# Patient Record
Sex: Male | Born: 1940 | Race: White | Hispanic: No | Marital: Married | Smoking: Former smoker
Health system: Southern US, Community
[De-identification: ages and names within clinical notes are randomized; demographics above are authoritative.]

## PROBLEM LIST (undated history)

## (undated) DIAGNOSIS — I050 Rheumatic mitral stenosis: Secondary | ICD-10-CM

## (undated) DIAGNOSIS — M1990 Unspecified osteoarthritis, unspecified site: Secondary | ICD-10-CM

## (undated) DIAGNOSIS — I4891 Unspecified atrial fibrillation: Secondary | ICD-10-CM

## (undated) DIAGNOSIS — R011 Cardiac murmur, unspecified: Secondary | ICD-10-CM

## (undated) DIAGNOSIS — I10 Essential (primary) hypertension: Secondary | ICD-10-CM

## (undated) DIAGNOSIS — K759 Inflammatory liver disease, unspecified: Secondary | ICD-10-CM

## (undated) DIAGNOSIS — I739 Peripheral vascular disease, unspecified: Secondary | ICD-10-CM

## (undated) DIAGNOSIS — I350 Nonrheumatic aortic (valve) stenosis: Secondary | ICD-10-CM

## (undated) DIAGNOSIS — I318 Other specified diseases of pericardium: Secondary | ICD-10-CM

---
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
## 1981-11-12 HISTORY — PX: CHOLECYSTECTOMY: SHX55

## 2005-11-12 HISTORY — PX: PERICARDIAL FLUID DRAINAGE: SHX5100

## 2007-01-29 ENCOUNTER — Other Ambulatory Visit: Admission: RE | Admit: 2007-01-29 | Discharge: 2007-01-29 | Payer: Self-pay | Admitting: Oncology

## 2007-07-17 ENCOUNTER — Ambulatory Visit: Payer: Self-pay | Admitting: Oncology

## 2010-11-12 HISTORY — PX: CATARACT EXTRACTION: SUR2

## 2012-09-29 ENCOUNTER — Other Ambulatory Visit: Payer: Self-pay | Admitting: Cardiovascular Disease

## 2012-10-23 ENCOUNTER — Ambulatory Visit (HOSPITAL_COMMUNITY)
Admission: RE | Admit: 2012-10-23 | Discharge: 2012-10-23 | Disposition: A | Discharge status: Home / Self Care | Payer: Medicare Other | Source: Ambulatory Visit | Attending: Cardiovascular Disease | Admitting: Cardiovascular Disease

## 2012-10-23 ENCOUNTER — Encounter (HOSPITAL_COMMUNITY)
Admission: RE | Disposition: A | Discharge status: Home / Self Care | Payer: Self-pay | Source: Ambulatory Visit | Attending: Cardiovascular Disease

## 2012-10-23 ENCOUNTER — Encounter (HOSPITAL_COMMUNITY): Payer: Self-pay

## 2012-10-23 DIAGNOSIS — I050 Rheumatic mitral stenosis: Secondary | ICD-10-CM | POA: Insufficient documentation

## 2012-10-23 HISTORY — DX: Unspecified osteoarthritis, unspecified site: M19.90

## 2012-10-23 HISTORY — DX: Essential (primary) hypertension: I10

## 2012-10-23 HISTORY — DX: Inflammatory liver disease, unspecified: K75.9

## 2012-10-23 HISTORY — DX: Peripheral vascular disease, unspecified: I73.9

## 2012-10-23 HISTORY — DX: Cardiac murmur, unspecified: R01.1

## 2012-10-23 HISTORY — PX: TEE WITHOUT CARDIOVERSION: SHX5443

## 2012-10-23 SURGERY — ECHOCARDIOGRAM, TRANSESOPHAGEAL
Anesthesia: Moderate Sedation

## 2012-10-23 MED ORDER — MIDAZOLAM HCL 5 MG/ML IJ SOLN
INTRAMUSCULAR | Status: AC
  Filled 2012-10-23: qty 2

## 2012-10-23 MED ORDER — BUTAMBEN-TETRACAINE-BENZOCAINE 2-2-14 % EX AERO
INHALATION_SPRAY | CUTANEOUS | Status: DC | PRN
  Administered 2012-10-23: 2 via TOPICAL

## 2012-10-23 MED ORDER — FENTANYL CITRATE 0.05 MG/ML IJ SOLN
INTRAMUSCULAR | Status: DC | PRN
  Administered 2012-10-23 (×2): 25 ug via INTRAVENOUS

## 2012-10-23 MED ORDER — FENTANYL CITRATE 0.05 MG/ML IJ SOLN
INTRAMUSCULAR | Status: AC
  Filled 2012-10-23: qty 2

## 2012-10-23 MED ORDER — MIDAZOLAM HCL 10 MG/2ML IJ SOLN
INTRAMUSCULAR | Status: DC | PRN
  Administered 2012-10-23 (×2): 2 mg via INTRAVENOUS

## 2012-10-23 MED ORDER — SODIUM CHLORIDE 0.9 % IV SOLN
INTRAVENOUS | Status: DC
  Administered 2012-10-23: 13:00:00 via INTRAVENOUS

## 2012-10-23 NOTE — H&P (Signed)
Jon Lozano is an 71 y.o. male.  Cardiologist: [facility]   Chief Complaint:  None HPI:   The patient is a 71 yo caucasian male with a history of RA, peripheral artery disease(all three arteries were occluded in the right LE-plavix), pericardial effusion-2007, PVD, Shingles, PNA-07/2012, Cataract surgery, cholecystectomy 1983, and tobacco abuse.  He still smokes but only when having a beer, which he has not had for the last 3 weeks.    His father had CAD with MI x3.  He does complain of DOE, LEE(ankles) and double vision.  He recently saw his ophthalmologist.  He denies N, V, fever, CP, SOB, orthopnea, PND, ABD pain, dysuria, hematuria, hematochezia.  He presents for TEE secondary to MV concern due to enlargement of LA.   Last NUC ~73months ago.  No past medical history on file.  No past surgical history on file.  No family history on file. Social History:  does not have a smoking history on file. He does not have any smokeless tobacco history on file. His alcohol and drug histories not on file.  Allergies: Not on File  Medications Prior to Admission  Medication Sig Dispense Refill  . aspirin 81 MG tablet Take 81 mg by mouth daily.      . calcium carbonate (OS-CAL) 1250 MG chewable tablet Chew 1 tablet by mouth daily. Takes 500 mg with 600 IU of vit D3      . clopidogrel (PLAVIX) 75 MG tablet Take 75 mg by mouth daily.      . fesoterodine (TOVIAZ) 8 MG TB24 Take 8 mg by mouth daily.      . folic acid (FOLVITE) 1 MG tablet Take 1 mg by mouth daily.      Marland Kitchen HYDROcodone-acetaminophen (NORCO/VICODIN) 5-325 MG per tablet Take 1 tablet by mouth every 6 (six) hours as needed.      . leflunomide (ARAVA) 20 MG tablet Take 20 mg by mouth daily.      . prednisoLONE 5 MG TABS Take 10 mg by mouth.      . pregabalin (LYRICA) 150 MG capsule Take 150 mg by mouth 3 (three) times daily.        No results found for this or any previous visit (from the past 48 hour(s)). No results  found.  Review of Systems  Constitutional: Negative for fever.  HENT: Negative for sore throat.   Eyes: Positive for double vision.  Respiratory: Negative for cough and shortness of breath.        +DOE  Cardiovascular: Positive for leg swelling. Negative for chest pain, palpitations, orthopnea and PND.  Gastrointestinal: Negative for nausea, vomiting, abdominal pain, diarrhea, constipation, blood in stool and melena.  Genitourinary: Negative for dysuria and hematuria.  Musculoskeletal: Negative for myalgias.  Neurological: Negative for dizziness and weakness.    There were no vitals taken for this visit. Physical Exam  Constitutional: He is oriented to person, place, and time. He appears well-developed. No distress.       Overweight  HENT:  Head: Normocephalic and atraumatic.  Eyes: EOM are normal. Pupils are equal, round, and reactive to light. No scleral icterus.  Neck: Normal range of motion. Neck supple.  Cardiovascular: Normal rate, regular rhythm, S1 normal and S2 normal.   No murmur heard. Pulses:      Radial pulses are 2+ on the right side, and 2+ on the left side.       Dorsalis pedis pulses are 1+ on the right  side, and 1+ on the left side.       No Carotid Bruits.  Respiratory: Effort normal and breath sounds normal. He has no wheezes. He has no rales.  GI: Soft. Bowel sounds are normal. He exhibits no distension. There is no tenderness.  Musculoskeletal: He exhibits edema.       2+ LEE   Lymphadenopathy:    He has no cervical adenopathy.  Neurological: He is alert and oriented to person, place, and time. He exhibits normal muscle tone.  Skin: Skin is warm and dry.  Psychiatric: He has a normal mood and affect.     Assessment/Plan 1.  Hx of pericardial eff 2. RA 3. PAD.  Plan:  He presents for TEE.  He does have 2+ LEE but no other signs of CHF.  He would likely benefit from a diuretic.  Will let is primary cardiologist address this.  BP and HR stable.     Jon Lozano, Jon Lozano 10/23/2012, 12:10 PM

## 2012-10-23 NOTE — Progress Notes (Signed)
  Echocardiogram Echocardiogram Transesophageal has been performed.  Georgian Co 10/23/2012, 2:27 PM

## 2012-10-23 NOTE — CV Procedure (Signed)
[name],Jon Lozano Male, 71 y.o., 1940-12-28  Location: [facility] ENDOSCOPY  Bed: NONE  MRN: [id number]  CSN: [id number]  Admit Dt: 10/23/12    TEE  INDICATIONS: valve calcification  PROCEDURE:   Informed consent was obtained prior to the procedure. The risks, benefits and alternatives for the procedure were discussed and the patient comprehended these risks.  Risks include, but are not limited to, cough, sore throat, vomiting, nausea, somnolence, esophageal and stomach trauma or perforation, bleeding, low blood pressure, aspiration, pneumonia, infection, trauma to the teeth and death.    After a procedural time-out, the oropharynx was anesthetized with 20% benzocaine spray. The patient was given 4 mg versed and 50 mcg fentanyl for moderate sedation.   The transesophageal probe was inserted in the esophagus and stomach without difficulty and multiple views were obtained.  The patient was kept under observation until the patient left the procedure room.  The patient left the procedure room in stable condition.   Agitated microbubble saline contrast was not administered.  COMPLICATIONS:    There were no immediate complications.  FINDINGS:  Severely calcified mitral annulus with extensive encroachment upon the mitral leaflets. Mitral stenosis, probably moderate to severe. The mean gradient is 14 mm Hg at a mean heart rate of 100 bpm. Pressure half time estimation was inaccurate due to E-A fusion. Mild mitral insufficiency. Aortic stenosis, mild. Normal to hyperdynamic left ventricular systolic function. EF >70%. Unable to assess diastolic function due to E-A fusion. Moderately dilated left atrium. No LA clot. Moderate aortic atherosclerotic debris.  RECOMMENDATIONS:   Follow up with Dr. Hanley Hays. Beta blockers will likely be beneficial to avoid tachycardia related increase in transmitral gradients.  Time Spent Directly with the Patient:  40 minutes   [name] 10/23/2012, 1:55 PM

## 2012-10-24 ENCOUNTER — Encounter (HOSPITAL_COMMUNITY): Payer: Self-pay | Admitting: Cardiovascular Disease

## 2014-02-05 ENCOUNTER — Inpatient Hospital Stay
Admission: RE | Admit: 2014-02-05 | Discharge: 2014-03-01 | Disposition: A | Discharge status: Skilled Nursing Facility | Payer: Medicare Other | Source: Other Acute Inpatient Hospital | Attending: Internal Medicine | Admitting: Internal Medicine

## 2014-02-06 ENCOUNTER — Other Ambulatory Visit (HOSPITAL_COMMUNITY): Payer: Medicare Other

## 2014-02-06 ENCOUNTER — Other Ambulatory Visit: Payer: Self-pay

## 2014-02-06 LAB — COMPREHENSIVE METABOLIC PANEL
ALBUMIN: 2.3 g/dL — AB (ref 3.5–5.2)
ALK PHOS: 96 U/L (ref 39–117)
ALT: 115 U/L — ABNORMAL HIGH (ref 0–53)
AST: 37 U/L (ref 0–37)
BUN: 19 mg/dL (ref 6–23)
CO2: 35 mEq/L — ABNORMAL HIGH (ref 19–32)
CREATININE: 1.17 mg/dL (ref 0.50–1.35)
Calcium: 8.8 mg/dL (ref 8.4–10.5)
Chloride: 95 mEq/L — ABNORMAL LOW (ref 96–112)
GFR calc Af Amer: 71 mL/min — ABNORMAL LOW (ref >90)
GFR calc non Af Amer: 61 mL/min — ABNORMAL LOW (ref >90)
Glucose, Bld: 80 mg/dL (ref 70–99)
POTASSIUM: 4.1 meq/L (ref 3.7–5.3)
Sodium: 140 mEq/L (ref 137–147)
TOTAL PROTEIN: 5.1 g/dL — AB (ref 6.0–8.3)
Total Bilirubin: 1.2 mg/dL (ref 0.3–1.2)

## 2014-02-06 LAB — PRO B NATRIURETIC PEPTIDE: Pro B Natriuretic peptide (BNP): 2426 pg/mL — ABNORMAL HIGH (ref 0–125)

## 2014-02-06 LAB — CBC WITH DIFFERENTIAL/PLATELET
BASOS ABS: 0 10*3/uL (ref 0.0–0.1)
BASOS PCT: 0 % (ref 0–1)
EOS ABS: 0.1 10*3/uL (ref 0.0–0.7)
Eosinophils Relative: 1 % (ref 0–5)
HCT: 31 % — ABNORMAL LOW (ref 39.0–52.0)
HEMOGLOBIN: 10.2 g/dL — AB (ref 13.0–17.0)
Lymphocytes Relative: 11 % — ABNORMAL LOW (ref 12–46)
Lymphs Abs: 1.1 10*3/uL (ref 0.7–4.0)
MCH: 29.4 pg (ref 26.0–34.0)
MCHC: 32.9 g/dL (ref 30.0–36.0)
MCV: 89.3 fL (ref 78.0–100.0)
MONOS PCT: 5 % (ref 3–12)
Monocytes Absolute: 0.5 10*3/uL (ref 0.1–1.0)
NEUTROS PCT: 83 % — AB (ref 43–77)
Neutro Abs: 8.2 10*3/uL — ABNORMAL HIGH (ref 1.7–7.7)
Platelets: 133 10*3/uL — ABNORMAL LOW (ref 150–400)
RBC: 3.47 MIL/uL — ABNORMAL LOW (ref 4.22–5.81)
RDW: 17.2 % — AB (ref 11.5–15.5)
WBC: 9.9 10*3/uL (ref 4.0–10.5)

## 2014-02-06 LAB — PROTIME-INR
INR: 1.23 (ref 0.00–1.49)
PROTHROMBIN TIME: 15.2 s (ref 11.6–15.2)

## 2014-02-06 LAB — HEMOGLOBIN A1C
Hgb A1c MFr Bld: 6 % — ABNORMAL HIGH (ref <5.7)
Mean Plasma Glucose: 126 mg/dL — ABNORMAL HIGH (ref <117)

## 2014-02-06 LAB — LIPID PANEL
CHOLESTEROL: 109 mg/dL (ref 0–200)
HDL: 23 mg/dL — AB (ref >39)
LDL Cholesterol: 63 mg/dL (ref 0–99)
TRIGLYCERIDES: 115 mg/dL (ref <150)
Total CHOL/HDL Ratio: 4.7 RATIO
VLDL: 23 mg/dL (ref 0–40)

## 2014-02-06 LAB — PROCALCITONIN: Procalcitonin: 0.52 ng/mL

## 2014-02-06 LAB — FERRITIN: Ferritin: 456 ng/mL — ABNORMAL HIGH (ref 22–322)

## 2014-02-06 LAB — TSH: TSH: 3.177 u[IU]/mL (ref 0.350–4.500)

## 2014-02-06 LAB — PREALBUMIN: PREALBUMIN: 15.3 mg/dL — AB (ref 17.0–34.0)

## 2014-02-06 LAB — MAGNESIUM: Magnesium: 2 mg/dL (ref 1.5–2.5)

## 2014-02-06 LAB — VITAMIN B12: VITAMIN B 12: 886 pg/mL (ref 211–911)

## 2014-02-06 LAB — T4, FREE: Free T4: 1.14 ng/dL (ref 0.80–1.80)

## 2014-02-06 LAB — PHOSPHORUS: PHOSPHORUS: 3.4 mg/dL (ref 2.3–4.6)

## 2014-02-06 LAB — APTT: APTT: 28 s (ref 24–37)

## 2014-02-07 LAB — BASIC METABOLIC PANEL
BUN: 19 mg/dL (ref 6–23)
CHLORIDE: 93 meq/L — AB (ref 96–112)
CO2: 33 mEq/L — ABNORMAL HIGH (ref 19–32)
Calcium: 8.8 mg/dL (ref 8.4–10.5)
Creatinine, Ser: 1.17 mg/dL (ref 0.50–1.35)
GFR calc Af Amer: 71 mL/min — ABNORMAL LOW (ref >90)
GFR, EST NON AFRICAN AMERICAN: 61 mL/min — AB (ref >90)
GLUCOSE: 84 mg/dL (ref 70–99)
Potassium: 3.3 mEq/L — ABNORMAL LOW (ref 3.7–5.3)
Sodium: 138 mEq/L (ref 137–147)

## 2014-02-07 LAB — OSMOLALITY, URINE: Osmolality, Ur: 421 mOsm/kg (ref 390–1090)

## 2014-02-07 LAB — CBC WITH DIFFERENTIAL/PLATELET
BASOS PCT: 0 % (ref 0–1)
Basophils Absolute: 0 10*3/uL (ref 0.0–0.1)
EOS ABS: 0.1 10*3/uL (ref 0.0–0.7)
Eosinophils Relative: 1 % (ref 0–5)
HCT: 34 % — ABNORMAL LOW (ref 39.0–52.0)
HEMOGLOBIN: 11.3 g/dL — AB (ref 13.0–17.0)
Lymphocytes Relative: 8 % — ABNORMAL LOW (ref 12–46)
Lymphs Abs: 0.9 10*3/uL (ref 0.7–4.0)
MCH: 29.7 pg (ref 26.0–34.0)
MCHC: 33.2 g/dL (ref 30.0–36.0)
MCV: 89.2 fL (ref 78.0–100.0)
MONOS PCT: 2 % — AB (ref 3–12)
Monocytes Absolute: 0.2 10*3/uL (ref 0.1–1.0)
Neutro Abs: 10.2 10*3/uL — ABNORMAL HIGH (ref 1.7–7.7)
Neutrophils Relative %: 89 % — ABNORMAL HIGH (ref 43–77)
Platelets: 133 10*3/uL — ABNORMAL LOW (ref 150–400)
RBC: 3.81 MIL/uL — ABNORMAL LOW (ref 4.22–5.81)
RDW: 16.9 % — ABNORMAL HIGH (ref 11.5–15.5)
WBC: 11.5 10*3/uL — ABNORMAL HIGH (ref 4.0–10.5)

## 2014-02-07 LAB — VANCOMYCIN, TROUGH: VANCOMYCIN TR: 17 ug/mL (ref 10.0–20.0)

## 2014-02-07 LAB — NA AND K (SODIUM & POTASSIUM), RAND UR
POTASSIUM UR: 69 meq/L
Sodium, Ur: 50 mEq/L

## 2014-02-07 LAB — FOLATE RBC: RBC FOLATE: 946 ng/mL — AB (ref >280)

## 2014-02-08 DIAGNOSIS — I059 Rheumatic mitral valve disease, unspecified: Secondary | ICD-10-CM

## 2014-02-08 LAB — CBC WITH DIFFERENTIAL/PLATELET
BASOS PCT: 0 % (ref 0–1)
Basophils Absolute: 0 10*3/uL (ref 0.0–0.1)
Eosinophils Absolute: 0.1 10*3/uL (ref 0.0–0.7)
Eosinophils Relative: 1 % (ref 0–5)
HCT: 32.5 % — ABNORMAL LOW (ref 39.0–52.0)
Hemoglobin: 10.8 g/dL — ABNORMAL LOW (ref 13.0–17.0)
LYMPHS ABS: 0.7 10*3/uL (ref 0.7–4.0)
Lymphocytes Relative: 8 % — ABNORMAL LOW (ref 12–46)
MCH: 29.8 pg (ref 26.0–34.0)
MCHC: 33.2 g/dL (ref 30.0–36.0)
MCV: 89.8 fL (ref 78.0–100.0)
Monocytes Absolute: 0.4 10*3/uL (ref 0.1–1.0)
Monocytes Relative: 4 % (ref 3–12)
NEUTROS PCT: 87 % — AB (ref 43–77)
Neutro Abs: 8.3 10*3/uL — ABNORMAL HIGH (ref 1.7–7.7)
PLATELETS: 132 10*3/uL — AB (ref 150–400)
RBC: 3.62 MIL/uL — AB (ref 4.22–5.81)
RDW: 16.8 % — ABNORMAL HIGH (ref 11.5–15.5)
WBC: 9.6 10*3/uL (ref 4.0–10.5)

## 2014-02-08 LAB — BASIC METABOLIC PANEL
BUN: 19 mg/dL (ref 6–23)
CO2: 31 mEq/L (ref 19–32)
Calcium: 8.5 mg/dL (ref 8.4–10.5)
Chloride: 95 mEq/L — ABNORMAL LOW (ref 96–112)
Creatinine, Ser: 1.17 mg/dL (ref 0.50–1.35)
GFR, EST AFRICAN AMERICAN: 71 mL/min — AB (ref >90)
GFR, EST NON AFRICAN AMERICAN: 61 mL/min — AB (ref >90)
Glucose, Bld: 83 mg/dL (ref 70–99)
POTASSIUM: 3.7 meq/L (ref 3.7–5.3)
SODIUM: 138 meq/L (ref 137–147)

## 2014-02-08 LAB — OSMOLALITY: Osmolality: 286 mOsm/kg (ref 275–300)

## 2014-02-08 NOTE — Progress Notes (Signed)
  Echocardiogram 2D Echocardiogram has been performed.  Arvil Chaco 02/08/2014, 3:41 PM

## 2014-02-09 LAB — BASIC METABOLIC PANEL
BUN: 21 mg/dL (ref 6–23)
CO2: 30 mEq/L (ref 19–32)
CREATININE: 1.07 mg/dL (ref 0.50–1.35)
Calcium: 8.8 mg/dL (ref 8.4–10.5)
Chloride: 97 mEq/L (ref 96–112)
GFR calc non Af Amer: 68 mL/min — ABNORMAL LOW (ref >90)
GFR, EST AFRICAN AMERICAN: 79 mL/min — AB (ref >90)
Glucose, Bld: 84 mg/dL (ref 70–99)
POTASSIUM: 3.4 meq/L — AB (ref 3.7–5.3)
Sodium: 139 mEq/L (ref 137–147)

## 2014-02-09 LAB — CBC
HCT: 30.9 % — ABNORMAL LOW (ref 39.0–52.0)
Hemoglobin: 10.2 g/dL — ABNORMAL LOW (ref 13.0–17.0)
MCH: 29.3 pg (ref 26.0–34.0)
MCHC: 33 g/dL (ref 30.0–36.0)
MCV: 88.8 fL (ref 78.0–100.0)
PLATELETS: 131 10*3/uL — AB (ref 150–400)
RBC: 3.48 MIL/uL — ABNORMAL LOW (ref 4.22–5.81)
RDW: 16.6 % — AB (ref 11.5–15.5)
WBC: 8.4 10*3/uL (ref 4.0–10.5)

## 2014-02-10 ENCOUNTER — Other Ambulatory Visit (HOSPITAL_COMMUNITY): Payer: Medicare Other

## 2014-02-10 LAB — BLOOD GAS, ARTERIAL
Acid-Base Excess: 2.1 mmol/L — ABNORMAL HIGH (ref 0.0–2.0)
Bicarbonate: 25.4 mEq/L — ABNORMAL HIGH (ref 20.0–24.0)
FIO2: 0.21 %
O2 Saturation: 92.2 %
PCO2 ART: 34.2 mmHg — AB (ref 35.0–45.0)
PO2 ART: 65 mmHg — AB (ref 80.0–100.0)
Patient temperature: 98.6
TCO2: 26.4 mmol/L (ref 0–100)
pH, Arterial: 7.484 — ABNORMAL HIGH (ref 7.350–7.450)

## 2014-02-10 LAB — BASIC METABOLIC PANEL
BUN: 26 mg/dL — ABNORMAL HIGH (ref 6–23)
CALCIUM: 8.8 mg/dL (ref 8.4–10.5)
CO2: 29 meq/L (ref 19–32)
Chloride: 96 mEq/L (ref 96–112)
Creatinine, Ser: 1.26 mg/dL (ref 0.50–1.35)
GFR calc Af Amer: 65 mL/min — ABNORMAL LOW (ref >90)
GFR calc non Af Amer: 56 mL/min — ABNORMAL LOW (ref >90)
GLUCOSE: 103 mg/dL — AB (ref 70–99)
Potassium: 3.8 mEq/L (ref 3.7–5.3)
SODIUM: 139 meq/L (ref 137–147)

## 2014-02-10 LAB — SEDIMENTATION RATE: Sed Rate: 44 mm/hr — ABNORMAL HIGH (ref 0–16)

## 2014-02-10 LAB — C-REACTIVE PROTEIN: CRP: 3.6 mg/dL — ABNORMAL HIGH (ref <0.60)

## 2014-02-11 LAB — CBC
HCT: 32.9 % — ABNORMAL LOW (ref 39.0–52.0)
Hemoglobin: 10.6 g/dL — ABNORMAL LOW (ref 13.0–17.0)
MCH: 29.4 pg (ref 26.0–34.0)
MCHC: 32.2 g/dL (ref 30.0–36.0)
MCV: 91.4 fL (ref 78.0–100.0)
PLATELETS: 120 10*3/uL — AB (ref 150–400)
RBC: 3.6 MIL/uL — AB (ref 4.22–5.81)
RDW: 16.6 % — AB (ref 11.5–15.5)
WBC: 8 10*3/uL (ref 4.0–10.5)

## 2014-02-11 LAB — BASIC METABOLIC PANEL
BUN: 29 mg/dL — ABNORMAL HIGH (ref 6–23)
CHLORIDE: 99 meq/L (ref 96–112)
CO2: 28 meq/L (ref 19–32)
Calcium: 9.1 mg/dL (ref 8.4–10.5)
Creatinine, Ser: 1.06 mg/dL (ref 0.50–1.35)
GFR calc non Af Amer: 69 mL/min — ABNORMAL LOW (ref >90)
GFR, EST AFRICAN AMERICAN: 80 mL/min — AB (ref >90)
Glucose, Bld: 111 mg/dL — ABNORMAL HIGH (ref 70–99)
Potassium: 3.7 mEq/L (ref 3.7–5.3)
SODIUM: 141 meq/L (ref 137–147)

## 2014-02-15 LAB — CBC WITH DIFFERENTIAL/PLATELET
Basophils Absolute: 0 10*3/uL (ref 0.0–0.1)
Basophils Relative: 1 % (ref 0–1)
Eosinophils Absolute: 0.5 10*3/uL (ref 0.0–0.7)
Eosinophils Relative: 8 % — ABNORMAL HIGH (ref 0–5)
HCT: 29.9 % — ABNORMAL LOW (ref 39.0–52.0)
HEMOGLOBIN: 9.5 g/dL — AB (ref 13.0–17.0)
LYMPHS ABS: 1.1 10*3/uL (ref 0.7–4.0)
LYMPHS PCT: 17 % (ref 12–46)
MCH: 29.1 pg (ref 26.0–34.0)
MCHC: 31.8 g/dL (ref 30.0–36.0)
MCV: 91.4 fL (ref 78.0–100.0)
MONO ABS: 0.3 10*3/uL (ref 0.1–1.0)
MONOS PCT: 5 % (ref 3–12)
NEUTROS ABS: 4.8 10*3/uL (ref 1.7–7.7)
NEUTROS PCT: 69 % (ref 43–77)
Platelets: 163 10*3/uL (ref 150–400)
RBC: 3.27 MIL/uL — AB (ref 4.22–5.81)
RDW: 15.9 % — ABNORMAL HIGH (ref 11.5–15.5)
WBC: 6.8 10*3/uL (ref 4.0–10.5)

## 2014-02-15 LAB — PRO B NATRIURETIC PEPTIDE: PRO B NATRI PEPTIDE: 1292 pg/mL — AB (ref 0–125)

## 2014-02-15 LAB — COMPREHENSIVE METABOLIC PANEL
ALK PHOS: 117 U/L (ref 39–117)
ALT: 27 U/L (ref 0–53)
AST: 23 U/L (ref 0–37)
Albumin: 2.5 g/dL — ABNORMAL LOW (ref 3.5–5.2)
BUN: 29 mg/dL — ABNORMAL HIGH (ref 6–23)
CO2: 32 mEq/L (ref 19–32)
Calcium: 8.8 mg/dL (ref 8.4–10.5)
Chloride: 100 mEq/L (ref 96–112)
Creatinine, Ser: 0.98 mg/dL (ref 0.50–1.35)
GFR calc non Af Amer: 81 mL/min — ABNORMAL LOW (ref >90)
GLUCOSE: 79 mg/dL (ref 70–99)
POTASSIUM: 4.2 meq/L (ref 3.7–5.3)
Sodium: 144 mEq/L (ref 137–147)
TOTAL PROTEIN: 5.8 g/dL — AB (ref 6.0–8.3)
Total Bilirubin: 0.7 mg/dL (ref 0.3–1.2)

## 2014-02-15 LAB — SEDIMENTATION RATE: Sed Rate: 54 mm/hr — ABNORMAL HIGH (ref 0–16)

## 2014-02-15 LAB — VANCOMYCIN, TROUGH: Vancomycin Tr: 17.9 ug/mL (ref 10.0–20.0)

## 2014-02-15 LAB — PREALBUMIN: Prealbumin: 16.9 mg/dL — ABNORMAL LOW (ref 17.0–34.0)

## 2014-02-15 LAB — C-REACTIVE PROTEIN: CRP: 3.3 mg/dL — ABNORMAL HIGH (ref <0.60)

## 2014-02-16 LAB — CULTURE, BLOOD (ROUTINE X 2)
CULTURE: NO GROWTH
Culture: NO GROWTH

## 2014-02-17 ENCOUNTER — Other Ambulatory Visit (HOSPITAL_COMMUNITY): Payer: Medicare Other

## 2014-02-17 LAB — CBC
HEMATOCRIT: 32.6 % — AB (ref 39.0–52.0)
Hemoglobin: 10.3 g/dL — ABNORMAL LOW (ref 13.0–17.0)
MCH: 29.1 pg (ref 26.0–34.0)
MCHC: 31.6 g/dL (ref 30.0–36.0)
MCV: 92.1 fL (ref 78.0–100.0)
PLATELETS: 210 10*3/uL (ref 150–400)
RBC: 3.54 MIL/uL — AB (ref 4.22–5.81)
RDW: 16.2 % — ABNORMAL HIGH (ref 11.5–15.5)
WBC: 8.2 10*3/uL (ref 4.0–10.5)

## 2014-02-17 LAB — BASIC METABOLIC PANEL
BUN: 25 mg/dL — ABNORMAL HIGH (ref 6–23)
CHLORIDE: 98 meq/L (ref 96–112)
CO2: 31 meq/L (ref 19–32)
CREATININE: 1.08 mg/dL (ref 0.50–1.35)
Calcium: 9 mg/dL (ref 8.4–10.5)
GFR calc Af Amer: 78 mL/min — ABNORMAL LOW (ref >90)
GFR calc non Af Amer: 67 mL/min — ABNORMAL LOW (ref >90)
Glucose, Bld: 85 mg/dL (ref 70–99)
Potassium: 4.7 mEq/L (ref 3.7–5.3)
Sodium: 142 mEq/L (ref 137–147)

## 2014-02-19 LAB — CBC WITH DIFFERENTIAL/PLATELET
Basophils Absolute: 0.1 10*3/uL (ref 0.0–0.1)
Basophils Relative: 1 % (ref 0–1)
EOS PCT: 8 % — AB (ref 0–5)
Eosinophils Absolute: 0.5 10*3/uL (ref 0.0–0.7)
HCT: 28.6 % — ABNORMAL LOW (ref 39.0–52.0)
HEMOGLOBIN: 8.9 g/dL — AB (ref 13.0–17.0)
LYMPHS ABS: 1.1 10*3/uL (ref 0.7–4.0)
LYMPHS PCT: 17 % (ref 12–46)
MCH: 28.6 pg (ref 26.0–34.0)
MCHC: 31.1 g/dL (ref 30.0–36.0)
MCV: 92 fL (ref 78.0–100.0)
MONO ABS: 0.7 10*3/uL (ref 0.1–1.0)
MONOS PCT: 11 % (ref 3–12)
Neutro Abs: 3.9 10*3/uL (ref 1.7–7.7)
Neutrophils Relative %: 63 % (ref 43–77)
Platelets: 189 10*3/uL (ref 150–400)
RBC: 3.11 MIL/uL — AB (ref 4.22–5.81)
RDW: 16.2 % — ABNORMAL HIGH (ref 11.5–15.5)
WBC: 6.2 10*3/uL (ref 4.0–10.5)

## 2014-02-19 LAB — BASIC METABOLIC PANEL
BUN: 28 mg/dL — ABNORMAL HIGH (ref 6–23)
CALCIUM: 8.8 mg/dL (ref 8.4–10.5)
CO2: 33 meq/L — AB (ref 19–32)
Chloride: 100 mEq/L (ref 96–112)
Creatinine, Ser: 1.03 mg/dL (ref 0.50–1.35)
GFR calc Af Amer: 82 mL/min — ABNORMAL LOW (ref >90)
GFR calc non Af Amer: 71 mL/min — ABNORMAL LOW (ref >90)
GLUCOSE: 87 mg/dL (ref 70–99)
Potassium: 4.1 mEq/L (ref 3.7–5.3)
Sodium: 144 mEq/L (ref 137–147)

## 2014-02-21 LAB — CBC WITH DIFFERENTIAL/PLATELET
Basophils Absolute: 0.1 10*3/uL (ref 0.0–0.1)
Basophils Relative: 1 % (ref 0–1)
EOS ABS: 0.4 10*3/uL (ref 0.0–0.7)
EOS PCT: 6 % — AB (ref 0–5)
HCT: 28 % — ABNORMAL LOW (ref 39.0–52.0)
Hemoglobin: 8.9 g/dL — ABNORMAL LOW (ref 13.0–17.0)
LYMPHS ABS: 1.4 10*3/uL (ref 0.7–4.0)
Lymphocytes Relative: 21 % (ref 12–46)
MCH: 29.3 pg (ref 26.0–34.0)
MCHC: 31.8 g/dL (ref 30.0–36.0)
MCV: 92.1 fL (ref 78.0–100.0)
Monocytes Absolute: 0.6 10*3/uL (ref 0.1–1.0)
Monocytes Relative: 8 % (ref 3–12)
Neutro Abs: 4.3 10*3/uL (ref 1.7–7.7)
Neutrophils Relative %: 64 % (ref 43–77)
Platelets: 207 10*3/uL (ref 150–400)
RBC: 3.04 MIL/uL — ABNORMAL LOW (ref 4.22–5.81)
RDW: 16.1 % — ABNORMAL HIGH (ref 11.5–15.5)
WBC: 6.8 10*3/uL (ref 4.0–10.5)

## 2014-02-21 LAB — BASIC METABOLIC PANEL
BUN: 30 mg/dL — AB (ref 6–23)
CALCIUM: 8.9 mg/dL (ref 8.4–10.5)
CO2: 33 mEq/L — ABNORMAL HIGH (ref 19–32)
CREATININE: 1.05 mg/dL (ref 0.50–1.35)
Chloride: 102 mEq/L (ref 96–112)
GFR calc Af Amer: 80 mL/min — ABNORMAL LOW (ref >90)
GFR calc non Af Amer: 69 mL/min — ABNORMAL LOW (ref >90)
GLUCOSE: 93 mg/dL (ref 70–99)
Potassium: 3.8 mEq/L (ref 3.7–5.3)
Sodium: 145 mEq/L (ref 137–147)

## 2014-02-21 LAB — PHOSPHORUS: Phosphorus: 4 mg/dL (ref 2.3–4.6)

## 2014-02-21 LAB — MAGNESIUM: Magnesium: 2.3 mg/dL (ref 1.5–2.5)

## 2014-02-22 LAB — PREALBUMIN: Prealbumin: 18.2 mg/dL (ref 17.0–34.0)

## 2014-02-22 LAB — VANCOMYCIN, TROUGH: Vancomycin Tr: 15.5 ug/mL (ref 10.0–20.0)

## 2014-02-23 LAB — CBC WITH DIFFERENTIAL/PLATELET
Basophils Absolute: 0.1 10*3/uL (ref 0.0–0.1)
Basophils Relative: 1 % (ref 0–1)
EOS ABS: 0.3 10*3/uL (ref 0.0–0.7)
EOS PCT: 4 % (ref 0–5)
HEMATOCRIT: 27 % — AB (ref 39.0–52.0)
Hemoglobin: 8.3 g/dL — ABNORMAL LOW (ref 13.0–17.0)
Lymphocytes Relative: 13 % (ref 12–46)
Lymphs Abs: 1 10*3/uL (ref 0.7–4.0)
MCH: 28.6 pg (ref 26.0–34.0)
MCHC: 30.7 g/dL (ref 30.0–36.0)
MCV: 93.1 fL (ref 78.0–100.0)
Monocytes Absolute: 0.8 10*3/uL (ref 0.1–1.0)
Monocytes Relative: 10 % (ref 3–12)
Neutro Abs: 5.7 10*3/uL (ref 1.7–7.7)
Neutrophils Relative %: 72 % (ref 43–77)
Platelets: 214 10*3/uL (ref 150–400)
RBC: 2.9 MIL/uL — AB (ref 4.22–5.81)
RDW: 16.2 % — ABNORMAL HIGH (ref 11.5–15.5)
WBC: 7.9 10*3/uL (ref 4.0–10.5)

## 2014-02-23 LAB — PREALBUMIN: PREALBUMIN: 16 mg/dL — AB (ref 17.0–34.0)

## 2014-02-23 LAB — C-REACTIVE PROTEIN: CRP: 3 mg/dL — ABNORMAL HIGH (ref <0.60)

## 2014-02-23 LAB — BASIC METABOLIC PANEL
BUN: 30 mg/dL — AB (ref 6–23)
CO2: 32 mEq/L (ref 19–32)
Calcium: 8.6 mg/dL (ref 8.4–10.5)
Chloride: 102 mEq/L (ref 96–112)
Creatinine, Ser: 1.17 mg/dL (ref 0.50–1.35)
GFR calc Af Amer: 71 mL/min — ABNORMAL LOW (ref >90)
GFR, EST NON AFRICAN AMERICAN: 61 mL/min — AB (ref >90)
Glucose, Bld: 90 mg/dL (ref 70–99)
Potassium: 4.2 mEq/L (ref 3.7–5.3)
Sodium: 143 mEq/L (ref 137–147)

## 2014-02-23 LAB — SEDIMENTATION RATE: Sed Rate: 69 mm/hr — ABNORMAL HIGH (ref 0–16)

## 2014-02-25 ENCOUNTER — Other Ambulatory Visit (HOSPITAL_COMMUNITY): Payer: Medicare Other

## 2014-02-25 LAB — CBC
HCT: 27.9 % — ABNORMAL LOW (ref 39.0–52.0)
HEMOGLOBIN: 8.7 g/dL — AB (ref 13.0–17.0)
MCH: 28.7 pg (ref 26.0–34.0)
MCHC: 31.2 g/dL (ref 30.0–36.0)
MCV: 92.1 fL (ref 78.0–100.0)
PLATELETS: 245 10*3/uL (ref 150–400)
RBC: 3.03 MIL/uL — AB (ref 4.22–5.81)
RDW: 16.2 % — AB (ref 11.5–15.5)
WBC: 7.6 10*3/uL (ref 4.0–10.5)

## 2014-02-25 LAB — PHOSPHORUS: PHOSPHORUS: 3.9 mg/dL (ref 2.3–4.6)

## 2014-02-25 LAB — MAGNESIUM: MAGNESIUM: 2.2 mg/dL (ref 1.5–2.5)

## 2014-02-25 LAB — BASIC METABOLIC PANEL
BUN: 31 mg/dL — ABNORMAL HIGH (ref 6–23)
CALCIUM: 8.9 mg/dL (ref 8.4–10.5)
CHLORIDE: 101 meq/L (ref 96–112)
CO2: 31 mEq/L (ref 19–32)
CREATININE: 1.11 mg/dL (ref 0.50–1.35)
GFR calc non Af Amer: 65 mL/min — ABNORMAL LOW (ref >90)
GFR, EST AFRICAN AMERICAN: 75 mL/min — AB (ref >90)
Glucose, Bld: 81 mg/dL (ref 70–99)
Potassium: 3.9 mEq/L (ref 3.7–5.3)
SODIUM: 146 meq/L (ref 137–147)

## 2014-02-26 LAB — VANCOMYCIN, TROUGH: Vancomycin Tr: 18.2 ug/mL (ref 10.0–20.0)

## 2014-03-01 LAB — CBC WITH DIFFERENTIAL/PLATELET
BASOS ABS: 0 10*3/uL (ref 0.0–0.1)
BASOS PCT: 1 % (ref 0–1)
EOS ABS: 0.2 10*3/uL (ref 0.0–0.7)
Eosinophils Relative: 3 % (ref 0–5)
HCT: 28.3 % — ABNORMAL LOW (ref 39.0–52.0)
Hemoglobin: 8.7 g/dL — ABNORMAL LOW (ref 13.0–17.0)
Lymphocytes Relative: 11 % — ABNORMAL LOW (ref 12–46)
Lymphs Abs: 0.8 10*3/uL (ref 0.7–4.0)
MCH: 29 pg (ref 26.0–34.0)
MCHC: 30.7 g/dL (ref 30.0–36.0)
MCV: 94.3 fL (ref 78.0–100.0)
Monocytes Absolute: 0.7 10*3/uL (ref 0.1–1.0)
Monocytes Relative: 9 % (ref 3–12)
Neutro Abs: 5.7 10*3/uL (ref 1.7–7.7)
Neutrophils Relative %: 76 % (ref 43–77)
PLATELETS: 217 10*3/uL (ref 150–400)
RBC: 3 MIL/uL — ABNORMAL LOW (ref 4.22–5.81)
RDW: 16.7 % — AB (ref 11.5–15.5)
WBC: 7.4 10*3/uL (ref 4.0–10.5)

## 2014-03-01 LAB — COMPREHENSIVE METABOLIC PANEL
ALBUMIN: 2.5 g/dL — AB (ref 3.5–5.2)
ALT: 12 U/L (ref 0–53)
AST: 15 U/L (ref 0–37)
Alkaline Phosphatase: 126 U/L — ABNORMAL HIGH (ref 39–117)
BUN: 28 mg/dL — ABNORMAL HIGH (ref 6–23)
CALCIUM: 8.2 mg/dL — AB (ref 8.4–10.5)
CO2: 28 mEq/L (ref 19–32)
Chloride: 108 mEq/L (ref 96–112)
Creatinine, Ser: 0.98 mg/dL (ref 0.50–1.35)
GFR calc non Af Amer: 81 mL/min — ABNORMAL LOW (ref >90)
Glucose, Bld: 83 mg/dL (ref 70–99)
Potassium: 3.7 mEq/L (ref 3.7–5.3)
SODIUM: 147 meq/L (ref 137–147)
Total Bilirubin: 0.5 mg/dL (ref 0.3–1.2)
Total Protein: 5.7 g/dL — ABNORMAL LOW (ref 6.0–8.3)

## 2014-03-01 LAB — PREALBUMIN: Prealbumin: 15.6 mg/dL — ABNORMAL LOW (ref 17.0–34.0)

## 2014-03-01 LAB — C-REACTIVE PROTEIN: CRP: 1.3 mg/dL — ABNORMAL HIGH (ref <0.60)

## 2014-03-01 LAB — SEDIMENTATION RATE: Sed Rate: 65 mm/hr — ABNORMAL HIGH (ref 0–16)

## 2014-03-22 ENCOUNTER — Other Ambulatory Visit (HOSPITAL_COMMUNITY): Payer: Medicare Other

## 2014-03-22 ENCOUNTER — Other Ambulatory Visit (HOSPITAL_COMMUNITY): Payer: Self-pay

## 2014-03-22 ENCOUNTER — Inpatient Hospital Stay
Admission: AD | Admit: 2014-03-22 | Discharge: 2014-04-21 | Disposition: A | Discharge status: Rehabilitation Facility | Payer: Medicare Other | Source: Ambulatory Visit | Attending: Internal Medicine | Admitting: Internal Medicine

## 2014-03-22 LAB — CBC WITH DIFFERENTIAL/PLATELET
Basophils Absolute: 0 10*3/uL (ref 0.0–0.1)
Basophils Relative: 0 % (ref 0–1)
Eosinophils Absolute: 0 10*3/uL (ref 0.0–0.7)
Eosinophils Relative: 0 % (ref 0–5)
HCT: 25.8 % — ABNORMAL LOW (ref 39.0–52.0)
HEMOGLOBIN: 8.2 g/dL — AB (ref 13.0–17.0)
LYMPHS ABS: 0.8 10*3/uL (ref 0.7–4.0)
Lymphocytes Relative: 8 % — ABNORMAL LOW (ref 12–46)
MCH: 28.7 pg (ref 26.0–34.0)
MCHC: 31.8 g/dL (ref 30.0–36.0)
MCV: 90.2 fL (ref 78.0–100.0)
MONOS PCT: 7 % (ref 3–12)
Monocytes Absolute: 0.7 10*3/uL (ref 0.1–1.0)
NEUTROS ABS: 8 10*3/uL — AB (ref 1.7–7.7)
NEUTROS PCT: 85 % — AB (ref 43–77)
Platelets: 269 10*3/uL (ref 150–400)
RBC: 2.86 MIL/uL — ABNORMAL LOW (ref 4.22–5.81)
RDW: 16.3 % — ABNORMAL HIGH (ref 11.5–15.5)
WBC: 9.5 10*3/uL (ref 4.0–10.5)

## 2014-03-23 LAB — URINE MICROSCOPIC-ADD ON

## 2014-03-23 LAB — PHOSPHORUS: Phosphorus: 2.9 mg/dL (ref 2.3–4.6)

## 2014-03-23 LAB — HEMOGLOBIN A1C
Hgb A1c MFr Bld: 6.4 % — ABNORMAL HIGH (ref <5.7)
MEAN PLASMA GLUCOSE: 137 mg/dL — AB (ref <117)

## 2014-03-23 LAB — URINALYSIS, ROUTINE W REFLEX MICROSCOPIC
Bilirubin Urine: NEGATIVE
GLUCOSE, UA: NEGATIVE mg/dL
Hgb urine dipstick: NEGATIVE
KETONES UR: NEGATIVE mg/dL
Nitrite: NEGATIVE
Protein, ur: NEGATIVE mg/dL
SPECIFIC GRAVITY, URINE: 1.019 (ref 1.005–1.030)
Urobilinogen, UA: 0.2 mg/dL (ref 0.0–1.0)
pH: 6 (ref 5.0–8.0)

## 2014-03-23 LAB — PROTIME-INR
INR: 1.29 (ref 0.00–1.49)
Prothrombin Time: 15.8 seconds — ABNORMAL HIGH (ref 11.6–15.2)

## 2014-03-23 LAB — COMPREHENSIVE METABOLIC PANEL
ALT: 13 U/L (ref 0–53)
AST: 26 U/L (ref 0–37)
Albumin: 2.4 g/dL — ABNORMAL LOW (ref 3.5–5.2)
Alkaline Phosphatase: 129 U/L — ABNORMAL HIGH (ref 39–117)
BUN: 21 mg/dL (ref 6–23)
CO2: 30 mEq/L (ref 19–32)
Calcium: 8.6 mg/dL (ref 8.4–10.5)
Chloride: 97 mEq/L (ref 96–112)
Creatinine, Ser: 0.88 mg/dL (ref 0.50–1.35)
GFR calc Af Amer: >90 mL/min (ref >90)
GFR calc non Af Amer: 84 mL/min — ABNORMAL LOW (ref >90)
Glucose, Bld: 113 mg/dL — ABNORMAL HIGH (ref 70–99)
Potassium: 4.6 mEq/L (ref 3.7–5.3)
Sodium: 139 mEq/L (ref 137–147)
Total Bilirubin: 0.5 mg/dL (ref 0.3–1.2)
Total Protein: 6.3 g/dL (ref 6.0–8.3)

## 2014-03-23 LAB — PREALBUMIN: Prealbumin: 13 mg/dL — ABNORMAL LOW (ref 17.0–34.0)

## 2014-03-23 LAB — TSH: TSH: 1.35 u[IU]/mL (ref 0.350–4.500)

## 2014-03-23 LAB — FOLATE RBC: RBC FOLATE: 1445 ng/mL — AB (ref >280)

## 2014-03-23 LAB — C-REACTIVE PROTEIN: CRP: 5 mg/dL — ABNORMAL HIGH (ref <0.60)

## 2014-03-23 LAB — VITAMIN B12: Vitamin B-12: 447 pg/mL (ref 211–911)

## 2014-03-23 LAB — FERRITIN: Ferritin: 311 ng/mL (ref 22–322)

## 2014-03-23 LAB — MAGNESIUM: MAGNESIUM: 2.2 mg/dL (ref 1.5–2.5)

## 2014-03-23 LAB — T4, FREE: Free T4: 0.72 ng/dL — ABNORMAL LOW (ref 0.80–1.80)

## 2014-03-23 LAB — CK: Total CK: 58 U/L (ref 7–232)

## 2014-03-23 LAB — APTT: aPTT: 33 seconds (ref 24–37)

## 2014-03-23 LAB — PROCALCITONIN: Procalcitonin: 0.13 ng/mL

## 2014-03-24 LAB — BASIC METABOLIC PANEL
BUN: 17 mg/dL (ref 6–23)
CO2: 30 mEq/L (ref 19–32)
Calcium: 9 mg/dL (ref 8.4–10.5)
Chloride: 97 mEq/L (ref 96–112)
Creatinine, Ser: 0.87 mg/dL (ref 0.50–1.35)
GFR calc Af Amer: >90 mL/min (ref >90)
GFR calc non Af Amer: 85 mL/min — ABNORMAL LOW (ref >90)
GLUCOSE: 88 mg/dL (ref 70–99)
Potassium: 3.7 mEq/L (ref 3.7–5.3)
SODIUM: 138 meq/L (ref 137–147)

## 2014-03-24 LAB — CBC WITH DIFFERENTIAL/PLATELET
BASOS ABS: 0 10*3/uL (ref 0.0–0.1)
Basophils Relative: 0 % (ref 0–1)
Eosinophils Absolute: 0.1 10*3/uL (ref 0.0–0.7)
Eosinophils Relative: 2 % (ref 0–5)
HCT: 25.4 % — ABNORMAL LOW (ref 39.0–52.0)
Hemoglobin: 7.9 g/dL — ABNORMAL LOW (ref 13.0–17.0)
Lymphocytes Relative: 12 % (ref 12–46)
Lymphs Abs: 1 10*3/uL (ref 0.7–4.0)
MCH: 28.1 pg (ref 26.0–34.0)
MCHC: 31.1 g/dL (ref 30.0–36.0)
MCV: 90.4 fL (ref 78.0–100.0)
Monocytes Absolute: 0.7 10*3/uL (ref 0.1–1.0)
Monocytes Relative: 9 % (ref 3–12)
NEUTROS ABS: 6.5 10*3/uL (ref 1.7–7.7)
Neutrophils Relative %: 77 % (ref 43–77)
PLATELETS: 253 10*3/uL (ref 150–400)
RBC: 2.81 MIL/uL — ABNORMAL LOW (ref 4.22–5.81)
RDW: 16.4 % — AB (ref 11.5–15.5)
WBC: 8.4 10*3/uL (ref 4.0–10.5)

## 2014-03-24 LAB — PREALBUMIN: PREALBUMIN: 14.9 mg/dL — AB (ref 17.0–34.0)

## 2014-03-24 LAB — URINE CULTURE

## 2014-03-26 LAB — CBC WITH DIFFERENTIAL/PLATELET
Basophils Absolute: 0 10*3/uL (ref 0.0–0.1)
Basophils Relative: 0 % (ref 0–1)
Eosinophils Absolute: 0.1 10*3/uL (ref 0.0–0.7)
Eosinophils Relative: 2 % (ref 0–5)
HCT: 25 % — ABNORMAL LOW (ref 39.0–52.0)
Hemoglobin: 7.9 g/dL — ABNORMAL LOW (ref 13.0–17.0)
LYMPHS ABS: 1 10*3/uL (ref 0.7–4.0)
Lymphocytes Relative: 11 % — ABNORMAL LOW (ref 12–46)
MCH: 28.8 pg (ref 26.0–34.0)
MCHC: 31.6 g/dL (ref 30.0–36.0)
MCV: 91.2 fL (ref 78.0–100.0)
Monocytes Absolute: 0.7 10*3/uL (ref 0.1–1.0)
Monocytes Relative: 8 % (ref 3–12)
NEUTROS PCT: 79 % — AB (ref 43–77)
Neutro Abs: 7.3 10*3/uL (ref 1.7–7.7)
Platelets: 243 10*3/uL (ref 150–400)
RBC: 2.74 MIL/uL — AB (ref 4.22–5.81)
RDW: 16.7 % — ABNORMAL HIGH (ref 11.5–15.5)
WBC: 9.1 10*3/uL (ref 4.0–10.5)

## 2014-03-26 LAB — BASIC METABOLIC PANEL
BUN: 27 mg/dL — ABNORMAL HIGH (ref 6–23)
CO2: 31 meq/L (ref 19–32)
Calcium: 9 mg/dL (ref 8.4–10.5)
Chloride: 100 mEq/L (ref 96–112)
Creatinine, Ser: 0.98 mg/dL (ref 0.50–1.35)
GFR calc Af Amer: >90 mL/min (ref >90)
GFR calc non Af Amer: 81 mL/min — ABNORMAL LOW (ref >90)
GLUCOSE: 101 mg/dL — AB (ref 70–99)
POTASSIUM: 3.6 meq/L — AB (ref 3.7–5.3)
SODIUM: 142 meq/L (ref 137–147)

## 2014-03-26 LAB — C-REACTIVE PROTEIN: CRP: 3.5 mg/dL — ABNORMAL HIGH (ref <0.60)

## 2014-03-26 LAB — SEDIMENTATION RATE: SED RATE: 94 mm/h — AB (ref 0–16)

## 2014-03-27 LAB — BASIC METABOLIC PANEL
BUN: 24 mg/dL — ABNORMAL HIGH (ref 6–23)
CHLORIDE: 102 meq/L (ref 96–112)
CO2: 29 mEq/L (ref 19–32)
Calcium: 8.8 mg/dL (ref 8.4–10.5)
Creatinine, Ser: 0.85 mg/dL (ref 0.50–1.35)
GFR calc Af Amer: >90 mL/min (ref >90)
GFR calc non Af Amer: 86 mL/min — ABNORMAL LOW (ref >90)
Glucose, Bld: 75 mg/dL (ref 70–99)
POTASSIUM: 3.8 meq/L (ref 3.7–5.3)
SODIUM: 142 meq/L (ref 137–147)

## 2014-03-29 LAB — COMPREHENSIVE METABOLIC PANEL
ALT: 14 U/L (ref 0–53)
AST: 26 U/L (ref 0–37)
Albumin: 2.7 g/dL — ABNORMAL LOW (ref 3.5–5.2)
Alkaline Phosphatase: 135 U/L — ABNORMAL HIGH (ref 39–117)
BUN: 20 mg/dL (ref 6–23)
CALCIUM: 8.9 mg/dL (ref 8.4–10.5)
CO2: 30 meq/L (ref 19–32)
Chloride: 97 mEq/L (ref 96–112)
Creatinine, Ser: 0.86 mg/dL (ref 0.50–1.35)
GFR calc non Af Amer: 85 mL/min — ABNORMAL LOW (ref >90)
GLUCOSE: 103 mg/dL — AB (ref 70–99)
Potassium: 3.5 mEq/L — ABNORMAL LOW (ref 3.7–5.3)
SODIUM: 140 meq/L (ref 137–147)
Total Bilirubin: 0.5 mg/dL (ref 0.3–1.2)
Total Protein: 6.4 g/dL (ref 6.0–8.3)

## 2014-03-29 LAB — CBC WITH DIFFERENTIAL/PLATELET
Basophils Absolute: 0 10*3/uL (ref 0.0–0.1)
Basophils Relative: 0 % (ref 0–1)
EOS ABS: 0.1 10*3/uL (ref 0.0–0.7)
EOS PCT: 2 % (ref 0–5)
HEMATOCRIT: 28.1 % — AB (ref 39.0–52.0)
HEMOGLOBIN: 8.6 g/dL — AB (ref 13.0–17.0)
LYMPHS ABS: 0.7 10*3/uL (ref 0.7–4.0)
LYMPHS PCT: 9 % — AB (ref 12–46)
MCH: 28.1 pg (ref 26.0–34.0)
MCHC: 30.6 g/dL (ref 30.0–36.0)
MCV: 91.8 fL (ref 78.0–100.0)
MONO ABS: 0.7 10*3/uL (ref 0.1–1.0)
MONOS PCT: 9 % (ref 3–12)
Neutro Abs: 6.4 10*3/uL (ref 1.7–7.7)
Neutrophils Relative %: 80 % — ABNORMAL HIGH (ref 43–77)
PLATELETS: 222 10*3/uL (ref 150–400)
RBC: 3.06 MIL/uL — AB (ref 4.22–5.81)
RDW: 16.9 % — ABNORMAL HIGH (ref 11.5–15.5)
WBC: 7.9 10*3/uL (ref 4.0–10.5)

## 2014-03-29 LAB — PREALBUMIN: PREALBUMIN: 16.3 mg/dL — AB (ref 17.0–34.0)

## 2014-03-29 LAB — C-REACTIVE PROTEIN: CRP: 2.7 mg/dL — AB (ref <0.60)

## 2014-03-29 LAB — SEDIMENTATION RATE: Sed Rate: 84 mm/hr — ABNORMAL HIGH (ref 0–16)

## 2014-03-31 LAB — CBC
HEMATOCRIT: 33.8 % — AB (ref 39.0–52.0)
Hemoglobin: 10.4 g/dL — ABNORMAL LOW (ref 13.0–17.0)
MCH: 28.4 pg (ref 26.0–34.0)
MCHC: 30.8 g/dL (ref 30.0–36.0)
MCV: 92.3 fL (ref 78.0–100.0)
PLATELETS: 228 10*3/uL (ref 150–400)
RBC: 3.66 MIL/uL — ABNORMAL LOW (ref 4.22–5.81)
RDW: 16.7 % — ABNORMAL HIGH (ref 11.5–15.5)
WBC: 8.6 10*3/uL (ref 4.0–10.5)

## 2014-03-31 LAB — CK: Total CK: 61 U/L (ref 7–232)

## 2014-03-31 LAB — BASIC METABOLIC PANEL
BUN: 20 mg/dL (ref 6–23)
CO2: 27 mEq/L (ref 19–32)
Calcium: 9.6 mg/dL (ref 8.4–10.5)
Chloride: 96 mEq/L (ref 96–112)
Creatinine, Ser: 0.79 mg/dL (ref 0.50–1.35)
GFR calc Af Amer: >90 mL/min (ref >90)
GFR calc non Af Amer: 88 mL/min — ABNORMAL LOW (ref >90)
Glucose, Bld: 102 mg/dL — ABNORMAL HIGH (ref 70–99)
POTASSIUM: 4 meq/L (ref 3.7–5.3)
Sodium: 138 mEq/L (ref 137–147)

## 2014-04-04 LAB — CBC WITH DIFFERENTIAL/PLATELET
BASOS PCT: 1 % (ref 0–1)
Basophils Absolute: 0.1 10*3/uL (ref 0.0–0.1)
EOS ABS: 0.7 10*3/uL (ref 0.0–0.7)
Eosinophils Relative: 9 % — ABNORMAL HIGH (ref 0–5)
HCT: 29.5 % — ABNORMAL LOW (ref 39.0–52.0)
Hemoglobin: 9.6 g/dL — ABNORMAL LOW (ref 13.0–17.0)
Lymphocytes Relative: 6 % — ABNORMAL LOW (ref 12–46)
Lymphs Abs: 0.5 10*3/uL — ABNORMAL LOW (ref 0.7–4.0)
MCH: 29.2 pg (ref 26.0–34.0)
MCHC: 32.5 g/dL (ref 30.0–36.0)
MCV: 89.7 fL (ref 78.0–100.0)
Monocytes Absolute: 1.3 10*3/uL — ABNORMAL HIGH (ref 0.1–1.0)
Monocytes Relative: 19 % — ABNORMAL HIGH (ref 3–12)
Neutro Abs: 4.7 10*3/uL (ref 1.7–7.7)
Neutrophils Relative %: 65 % (ref 43–77)
PLATELETS: 191 10*3/uL (ref 150–400)
RBC: 3.29 MIL/uL — AB (ref 4.22–5.81)
RDW: 16.6 % — ABNORMAL HIGH (ref 11.5–15.5)
WBC: 7.2 10*3/uL (ref 4.0–10.5)

## 2014-04-04 LAB — BASIC METABOLIC PANEL
BUN: 21 mg/dL (ref 6–23)
CALCIUM: 9.4 mg/dL (ref 8.4–10.5)
CO2: 33 mEq/L — ABNORMAL HIGH (ref 19–32)
Chloride: 97 mEq/L (ref 96–112)
Creatinine, Ser: 0.92 mg/dL (ref 0.50–1.35)
GFR calc Af Amer: >90 mL/min (ref >90)
GFR, EST NON AFRICAN AMERICAN: 83 mL/min — AB (ref >90)
Glucose, Bld: 86 mg/dL (ref 70–99)
Potassium: 4.3 mEq/L (ref 3.7–5.3)
SODIUM: 139 meq/L (ref 137–147)

## 2014-04-04 LAB — C-REACTIVE PROTEIN: CRP: 3.4 mg/dL — ABNORMAL HIGH (ref <0.60)

## 2014-04-04 LAB — SEDIMENTATION RATE: SED RATE: 76 mm/h — AB (ref 0–16)

## 2014-04-07 LAB — CBC WITH DIFFERENTIAL/PLATELET
BASOS ABS: 0 10*3/uL (ref 0.0–0.1)
Basophils Relative: 1 % (ref 0–1)
EOS PCT: 9 % — AB (ref 0–5)
Eosinophils Absolute: 0.6 10*3/uL (ref 0.0–0.7)
HCT: 29.2 % — ABNORMAL LOW (ref 39.0–52.0)
Hemoglobin: 9.3 g/dL — ABNORMAL LOW (ref 13.0–17.0)
LYMPHS ABS: 0.7 10*3/uL (ref 0.7–4.0)
Lymphocytes Relative: 10 % — ABNORMAL LOW (ref 12–46)
MCH: 28.1 pg (ref 26.0–34.0)
MCHC: 31.8 g/dL (ref 30.0–36.0)
MCV: 88.2 fL (ref 78.0–100.0)
Monocytes Absolute: 0.4 10*3/uL (ref 0.1–1.0)
Monocytes Relative: 6 % (ref 3–12)
NEUTROS PCT: 74 % (ref 43–77)
Neutro Abs: 5.2 10*3/uL (ref 1.7–7.7)
PLATELETS: 160 10*3/uL (ref 150–400)
RBC: 3.31 MIL/uL — ABNORMAL LOW (ref 4.22–5.81)
RDW: 16.4 % — AB (ref 11.5–15.5)
WBC: 6.9 10*3/uL (ref 4.0–10.5)

## 2014-04-07 LAB — BASIC METABOLIC PANEL
BUN: 21 mg/dL (ref 6–23)
CALCIUM: 9 mg/dL (ref 8.4–10.5)
CO2: 31 mEq/L (ref 19–32)
Chloride: 96 mEq/L (ref 96–112)
Creatinine, Ser: 0.81 mg/dL (ref 0.50–1.35)
GFR calc Af Amer: >90 mL/min (ref >90)
GFR, EST NON AFRICAN AMERICAN: 87 mL/min — AB (ref >90)
GLUCOSE: 96 mg/dL (ref 70–99)
POTASSIUM: 3.7 meq/L (ref 3.7–5.3)
SODIUM: 139 meq/L (ref 137–147)

## 2014-04-07 LAB — MAGNESIUM: MAGNESIUM: 2.1 mg/dL (ref 1.5–2.5)

## 2014-04-07 LAB — PHOSPHORUS: PHOSPHORUS: 2.7 mg/dL (ref 2.3–4.6)

## 2014-04-08 LAB — CK: CK TOTAL: 42 U/L (ref 7–232)

## 2014-04-12 LAB — CULTURE, BLOOD (ROUTINE X 2)
CULTURE: NO GROWTH
Culture: NO GROWTH

## 2014-04-12 LAB — COMPREHENSIVE METABOLIC PANEL
ALT: 15 U/L (ref 0–53)
AST: 36 U/L (ref 0–37)
Albumin: 2.5 g/dL — ABNORMAL LOW (ref 3.5–5.2)
Alkaline Phosphatase: 137 U/L — ABNORMAL HIGH (ref 39–117)
BUN: 28 mg/dL — ABNORMAL HIGH (ref 6–23)
CO2: 33 mEq/L — ABNORMAL HIGH (ref 19–32)
Calcium: 9.2 mg/dL (ref 8.4–10.5)
Chloride: 99 mEq/L (ref 96–112)
Creatinine, Ser: 1.03 mg/dL (ref 0.50–1.35)
GFR calc non Af Amer: 71 mL/min — ABNORMAL LOW (ref >90)
GFR, EST AFRICAN AMERICAN: 82 mL/min — AB (ref >90)
Glucose, Bld: 88 mg/dL (ref 70–99)
Potassium: 4.3 mEq/L (ref 3.7–5.3)
SODIUM: 143 meq/L (ref 137–147)
TOTAL PROTEIN: 6.4 g/dL (ref 6.0–8.3)
Total Bilirubin: 0.7 mg/dL (ref 0.3–1.2)

## 2014-04-12 LAB — CBC
HCT: 32 % — ABNORMAL LOW (ref 39.0–52.0)
HEMOGLOBIN: 9.9 g/dL — AB (ref 13.0–17.0)
MCH: 27.2 pg (ref 26.0–34.0)
MCHC: 30.9 g/dL (ref 30.0–36.0)
MCV: 87.9 fL (ref 78.0–100.0)
Platelets: 159 10*3/uL (ref 150–400)
RBC: 3.64 MIL/uL — ABNORMAL LOW (ref 4.22–5.81)
RDW: 16.4 % — ABNORMAL HIGH (ref 11.5–15.5)
WBC: 6.3 10*3/uL (ref 4.0–10.5)

## 2014-04-12 LAB — CK: Total CK: 32 U/L (ref 7–232)

## 2014-04-12 LAB — C-REACTIVE PROTEIN: CRP: 3.5 mg/dL — AB (ref <0.60)

## 2014-04-12 LAB — SEDIMENTATION RATE: Sed Rate: 70 mm/hr — ABNORMAL HIGH (ref 0–16)

## 2014-04-12 LAB — PREALBUMIN: Prealbumin: 14.1 mg/dL — ABNORMAL LOW (ref 17.0–34.0)

## 2014-04-16 LAB — CBC
HEMATOCRIT: 31.5 % — AB (ref 39.0–52.0)
HEMOGLOBIN: 9.8 g/dL — AB (ref 13.0–17.0)
MCH: 27.2 pg (ref 26.0–34.0)
MCHC: 31.1 g/dL (ref 30.0–36.0)
MCV: 87.5 fL (ref 78.0–100.0)
Platelets: 175 10*3/uL (ref 150–400)
RBC: 3.6 MIL/uL — ABNORMAL LOW (ref 4.22–5.81)
RDW: 16.2 % — ABNORMAL HIGH (ref 11.5–15.5)
WBC: 5.7 10*3/uL (ref 4.0–10.5)

## 2014-04-16 LAB — BASIC METABOLIC PANEL
BUN: 18 mg/dL (ref 6–23)
CHLORIDE: 96 meq/L (ref 96–112)
CO2: 32 mEq/L (ref 19–32)
Calcium: 8.9 mg/dL (ref 8.4–10.5)
Creatinine, Ser: 0.81 mg/dL (ref 0.50–1.35)
GFR calc Af Amer: >90 mL/min (ref >90)
GFR, EST NON AFRICAN AMERICAN: 87 mL/min — AB (ref >90)
GLUCOSE: 102 mg/dL — AB (ref 70–99)
POTASSIUM: 3.4 meq/L — AB (ref 3.7–5.3)
SODIUM: 139 meq/L (ref 137–147)

## 2014-04-20 LAB — BASIC METABOLIC PANEL
BUN: 18 mg/dL (ref 6–23)
CO2: 33 meq/L — AB (ref 19–32)
Calcium: 9.5 mg/dL (ref 8.4–10.5)
Chloride: 99 mEq/L (ref 96–112)
Creatinine, Ser: 0.76 mg/dL (ref 0.50–1.35)
GFR calc Af Amer: >90 mL/min (ref >90)
GFR calc non Af Amer: 90 mL/min — ABNORMAL LOW (ref >90)
GLUCOSE: 83 mg/dL (ref 70–99)
POTASSIUM: 4.2 meq/L (ref 3.7–5.3)
Sodium: 142 mEq/L (ref 137–147)

## 2014-04-20 LAB — CBC
HEMATOCRIT: 32.7 % — AB (ref 39.0–52.0)
HEMOGLOBIN: 9.9 g/dL — AB (ref 13.0–17.0)
MCH: 26.3 pg (ref 26.0–34.0)
MCHC: 30.3 g/dL (ref 30.0–36.0)
MCV: 86.7 fL (ref 78.0–100.0)
Platelets: 188 10*3/uL (ref 150–400)
RBC: 3.77 MIL/uL — AB (ref 4.22–5.81)
RDW: 16.5 % — ABNORMAL HIGH (ref 11.5–15.5)
WBC: 5.7 10*3/uL (ref 4.0–10.5)

## 2014-04-20 LAB — SEDIMENTATION RATE: Sed Rate: 59 mm/hr — ABNORMAL HIGH (ref 0–16)

## 2014-04-20 LAB — C-REACTIVE PROTEIN: CRP: 2 mg/dL — AB (ref <0.60)

## 2014-04-20 LAB — CK: Total CK: 42 U/L (ref 7–232)

## 2014-04-21 ENCOUNTER — Inpatient Hospital Stay (HOSPITAL_COMMUNITY)
Admission: RE | Admit: 2014-04-21 | Discharge: 2014-04-26 | DRG: 945 | Disposition: A | Discharge status: Other Acute Inpatient Hospital | Payer: Medicare Other | Source: Other Acute Inpatient Hospital | Attending: Physical Medicine & Rehabilitation | Admitting: Physical Medicine & Rehabilitation

## 2014-04-21 ENCOUNTER — Encounter: Payer: Self-pay | Admitting: Physical Medicine and Rehabilitation

## 2014-04-21 DIAGNOSIS — D638 Anemia in other chronic diseases classified elsewhere: Secondary | ICD-10-CM | POA: Diagnosis present

## 2014-04-21 DIAGNOSIS — A419 Sepsis, unspecified organism: Secondary | ICD-10-CM | POA: Diagnosis not present

## 2014-04-21 DIAGNOSIS — I471 Supraventricular tachycardia, unspecified: Secondary | ICD-10-CM

## 2014-04-21 DIAGNOSIS — Z87891 Personal history of nicotine dependence: Secondary | ICD-10-CM

## 2014-04-21 DIAGNOSIS — I498 Other specified cardiac arrhythmias: Secondary | ICD-10-CM | POA: Diagnosis not present

## 2014-04-21 DIAGNOSIS — J150 Pneumonia due to Klebsiella pneumoniae: Secondary | ICD-10-CM | POA: Diagnosis present

## 2014-04-21 DIAGNOSIS — A4902 Methicillin resistant Staphylococcus aureus infection, unspecified site: Secondary | ICD-10-CM | POA: Diagnosis present

## 2014-04-21 DIAGNOSIS — R0902 Hypoxemia: Secondary | ICD-10-CM | POA: Diagnosis not present

## 2014-04-21 DIAGNOSIS — Z7982 Long term (current) use of aspirin: Secondary | ICD-10-CM

## 2014-04-21 DIAGNOSIS — R5381 Other malaise: Secondary | ICD-10-CM

## 2014-04-21 DIAGNOSIS — J189 Pneumonia, unspecified organism: Secondary | ICD-10-CM

## 2014-04-21 DIAGNOSIS — B952 Enterococcus as the cause of diseases classified elsewhere: Secondary | ICD-10-CM | POA: Diagnosis present

## 2014-04-21 DIAGNOSIS — R609 Edema, unspecified: Secondary | ICD-10-CM

## 2014-04-21 DIAGNOSIS — T8130XA Disruption of wound, unspecified, initial encounter: Secondary | ICD-10-CM | POA: Diagnosis present

## 2014-04-21 DIAGNOSIS — Z1621 Resistance to vancomycin: Secondary | ICD-10-CM

## 2014-04-21 DIAGNOSIS — IMO0002 Reserved for concepts with insufficient information to code with codable children: Secondary | ICD-10-CM

## 2014-04-21 DIAGNOSIS — J449 Chronic obstructive pulmonary disease, unspecified: Secondary | ICD-10-CM | POA: Diagnosis present

## 2014-04-21 DIAGNOSIS — E46 Unspecified protein-calorie malnutrition: Secondary | ICD-10-CM | POA: Diagnosis present

## 2014-04-21 DIAGNOSIS — R7881 Bacteremia: Secondary | ICD-10-CM

## 2014-04-21 DIAGNOSIS — Z5189 Encounter for other specified aftercare: Principal | ICD-10-CM

## 2014-04-21 DIAGNOSIS — I10 Essential (primary) hypertension: Secondary | ICD-10-CM

## 2014-04-21 DIAGNOSIS — R509 Fever, unspecified: Secondary | ICD-10-CM

## 2014-04-21 DIAGNOSIS — Z7902 Long term (current) use of antithrombotics/antiplatelets: Secondary | ICD-10-CM

## 2014-04-21 DIAGNOSIS — M129 Arthropathy, unspecified: Secondary | ICD-10-CM | POA: Diagnosis present

## 2014-04-21 DIAGNOSIS — Z79899 Other long term (current) drug therapy: Secondary | ICD-10-CM

## 2014-04-21 DIAGNOSIS — I739 Peripheral vascular disease, unspecified: Secondary | ICD-10-CM

## 2014-04-21 DIAGNOSIS — Y849 Medical procedure, unspecified as the cause of abnormal reaction of the patient, or of later complication, without mention of misadventure at the time of the procedure: Secondary | ICD-10-CM | POA: Diagnosis present

## 2014-04-21 DIAGNOSIS — Z1639 Resistance to other specified antimicrobial drug: Secondary | ICD-10-CM | POA: Diagnosis present

## 2014-04-21 DIAGNOSIS — G8929 Other chronic pain: Secondary | ICD-10-CM | POA: Diagnosis present

## 2014-04-21 DIAGNOSIS — R7309 Other abnormal glucose: Secondary | ICD-10-CM | POA: Diagnosis present

## 2014-04-21 DIAGNOSIS — T847XXA Infection and inflammatory reaction due to other internal orthopedic prosthetic devices, implants and grafts, initial encounter: Secondary | ICD-10-CM | POA: Diagnosis present

## 2014-04-21 DIAGNOSIS — A491 Streptococcal infection, unspecified site: Secondary | ICD-10-CM

## 2014-04-21 DIAGNOSIS — R1310 Dysphagia, unspecified: Secondary | ICD-10-CM

## 2014-04-21 DIAGNOSIS — J4489 Other specified chronic obstructive pulmonary disease: Secondary | ICD-10-CM | POA: Diagnosis present

## 2014-04-21 LAB — GLUCOSE, CAPILLARY: GLUCOSE-CAPILLARY: 185 mg/dL — AB (ref 70–99)

## 2014-04-21 MED ORDER — POLYETHYLENE GLYCOL 3350 17 G PO PACK
17.0000 g | PACK | Freq: Two times a day (BID) | ORAL | Status: DC
  Administered 2014-04-21 – 2014-04-26 (×10): 17 g via ORAL
  Filled 2014-04-21 (×14): qty 1

## 2014-04-21 MED ORDER — PROCHLORPERAZINE MALEATE 5 MG PO TABS
5.0000 mg | ORAL_TABLET | Freq: Four times a day (QID) | ORAL | Status: DC | PRN
  Filled 2014-04-21: qty 2

## 2014-04-21 MED ORDER — LIDOCAINE HCL 2 % EX GEL
CUTANEOUS | Status: DC | PRN
  Filled 2014-04-21: qty 5

## 2014-04-21 MED ORDER — PROCHLORPERAZINE 25 MG RE SUPP
12.5000 mg | Freq: Four times a day (QID) | RECTAL | Status: DC | PRN
  Filled 2014-04-21: qty 1

## 2014-04-21 MED ORDER — SACCHAROMYCES BOULARDII 250 MG PO CAPS
250.0000 mg | ORAL_CAPSULE | Freq: Two times a day (BID) | ORAL | Status: DC
  Administered 2014-04-21 – 2014-04-26 (×10): 250 mg via ORAL
  Filled 2014-04-21 (×15): qty 1

## 2014-04-21 MED ORDER — POLYSACCHARIDE IRON COMPLEX 150 MG PO CAPS
150.0000 mg | ORAL_CAPSULE | Freq: Three times a day (TID) | ORAL | Status: DC
  Administered 2014-04-21 – 2014-04-26 (×15): 150 mg via ORAL
  Filled 2014-04-21 (×20): qty 1

## 2014-04-21 MED ORDER — ADULT MULTIVITAMIN W/MINERALS CH
1.0000 | ORAL_TABLET | Freq: Every day | ORAL | Status: DC
  Administered 2014-04-22 – 2014-04-26 (×5): 1 via ORAL
  Filled 2014-04-21 (×9): qty 1

## 2014-04-21 MED ORDER — PREDNISONE 10 MG PO TABS
10.0000 mg | ORAL_TABLET | Freq: Every day | ORAL | Status: DC
  Administered 2014-04-22 – 2014-04-26 (×5): 10 mg via ORAL
  Filled 2014-04-21 (×8): qty 1

## 2014-04-21 MED ORDER — ASPIRIN EC 81 MG PO TBEC
81.0000 mg | DELAYED_RELEASE_TABLET | Freq: Every day | ORAL | Status: DC
  Administered 2014-04-22 – 2014-04-26 (×5): 81 mg via ORAL
  Filled 2014-04-21 (×8): qty 1

## 2014-04-21 MED ORDER — MIRTAZAPINE 15 MG PO TABS
15.0000 mg | ORAL_TABLET | Freq: Every day | ORAL | Status: DC
  Administered 2014-04-21 – 2014-04-25 (×5): 15 mg via ORAL
  Filled 2014-04-21 (×7): qty 1

## 2014-04-21 MED ORDER — ZINC SULFATE 220 (50 ZN) MG PO CAPS
220.0000 mg | ORAL_CAPSULE | Freq: Every day | ORAL | Status: DC
  Administered 2014-04-22 – 2014-04-26 (×5): 220 mg via ORAL
  Filled 2014-04-21 (×9): qty 1

## 2014-04-21 MED ORDER — ACETAMINOPHEN 325 MG PO TABS
325.0000 mg | ORAL_TABLET | ORAL | Status: DC | PRN
  Administered 2014-04-24 – 2014-04-26 (×3): 650 mg via ORAL
  Filled 2014-04-21 (×4): qty 2

## 2014-04-21 MED ORDER — TRAZODONE HCL 50 MG PO TABS: 25.0000 mg | ORAL_TABLET | Freq: Every evening | ORAL | Status: DC | PRN

## 2014-04-21 MED ORDER — PANTOPRAZOLE SODIUM 40 MG PO TBEC
40.0000 mg | DELAYED_RELEASE_TABLET | Freq: Every day | ORAL | Status: DC
  Administered 2014-04-22 – 2014-04-26 (×5): 40 mg via ORAL
  Filled 2014-04-21 (×5): qty 1

## 2014-04-21 MED ORDER — FOLIC ACID 1 MG PO TABS
1.0000 mg | ORAL_TABLET | Freq: Every day | ORAL | Status: DC
  Administered 2014-04-22 – 2014-04-26 (×5): 1 mg via ORAL
  Filled 2014-04-21 (×8): qty 1

## 2014-04-21 MED ORDER — CLOPIDOGREL BISULFATE 75 MG PO TABS
75.0000 mg | ORAL_TABLET | Freq: Every day | ORAL | Status: DC
  Administered 2014-04-22 – 2014-04-26 (×5): 75 mg via ORAL
  Filled 2014-04-21 (×7): qty 1

## 2014-04-21 MED ORDER — OXYCODONE HCL 5 MG PO TABS
5.0000 mg | ORAL_TABLET | Freq: Two times a day (BID) | ORAL | Status: DC
  Administered 2014-04-22 (×2): 5 mg via ORAL
  Filled 2014-04-21 (×2): qty 1

## 2014-04-21 MED ORDER — FLEET ENEMA 7-19 GM/118ML RE ENEM: 1.0000 | ENEMA | Freq: Once | RECTAL | Status: AC | PRN

## 2014-04-21 MED ORDER — ENOXAPARIN SODIUM 40 MG/0.4ML ~~LOC~~ SOLN
40.0000 mg | SUBCUTANEOUS | Status: DC
  Administered 2014-04-21 – 2014-04-26 (×5): 40 mg via SUBCUTANEOUS
  Filled 2014-04-21 (×7): qty 0.4

## 2014-04-21 MED ORDER — CALCIUM CARBONATE-VITAMIN D 500-200 MG-UNIT PO TABS
1.0000 | ORAL_TABLET | Freq: Two times a day (BID) | ORAL | Status: DC
  Administered 2014-04-21 – 2014-04-26 (×10): 1 via ORAL
  Filled 2014-04-21 (×15): qty 1

## 2014-04-21 MED ORDER — GUAIFENESIN ER 600 MG PO TB12
1200.0000 mg | ORAL_TABLET | Freq: Two times a day (BID) | ORAL | Status: DC
  Administered 2014-04-21 – 2014-04-23 (×5): 1200 mg via ORAL
  Administered 2014-04-24: 600 mg via ORAL
  Administered 2014-04-24: 1200 mg via ORAL
  Administered 2014-04-24: 600 mg via ORAL
  Administered 2014-04-25 – 2014-04-26 (×3): 1200 mg via ORAL
  Filled 2014-04-21 (×13): qty 2

## 2014-04-21 MED ORDER — ALUM & MAG HYDROXIDE-SIMETH 200-200-20 MG/5ML PO SUSP: 30.0000 mL | ORAL | Status: DC | PRN

## 2014-04-21 MED ORDER — LIDOCAINE 5 % EX PTCH
1.0000 | MEDICATED_PATCH | CUTANEOUS | Status: DC
  Administered 2014-04-22 – 2014-04-26 (×5): 1 via TRANSDERMAL
  Filled 2014-04-21 (×11): qty 1

## 2014-04-21 MED ORDER — PREGABALIN 50 MG PO CAPS
100.0000 mg | ORAL_CAPSULE | Freq: Three times a day (TID) | ORAL | Status: DC
  Administered 2014-04-21 – 2014-04-26 (×15): 100 mg via ORAL
  Filled 2014-04-21 (×15): qty 2

## 2014-04-21 MED ORDER — VITAMIN C 250 MG PO TABS
250.0000 mg | ORAL_TABLET | Freq: Three times a day (TID) | ORAL | Status: DC
  Administered 2014-04-21 – 2014-04-26 (×15): 250 mg via ORAL
  Filled 2014-04-21 (×20): qty 1

## 2014-04-21 MED ORDER — SODIUM CHLORIDE 0.9 % IV SOLN
1.0000 g | Freq: Three times a day (TID) | INTRAVENOUS | Status: DC
  Administered 2014-04-21 – 2014-04-26 (×15): 1 g via INTRAVENOUS
  Filled 2014-04-21 (×21): qty 1

## 2014-04-21 MED ORDER — PRO-STAT SUGAR FREE PO LIQD
30.0000 mL | Freq: Three times a day (TID) | ORAL | Status: DC
  Administered 2014-04-21 – 2014-04-24 (×8): 30 mL via ORAL
  Administered 2014-04-24: 18:00:00 via ORAL
  Administered 2014-04-24 – 2014-04-26 (×6): 30 mL via ORAL
  Filled 2014-04-21 (×18): qty 30

## 2014-04-21 MED ORDER — OXYCODONE HCL 5 MG PO TABS
5.0000 mg | ORAL_TABLET | ORAL | Status: DC | PRN
  Administered 2014-04-22: 10 mg via ORAL
  Administered 2014-04-23: 5 mg via ORAL
  Administered 2014-04-24: 10 mg via ORAL
  Filled 2014-04-21 (×4): qty 2

## 2014-04-21 MED ORDER — PROCHLORPERAZINE EDISYLATE 5 MG/ML IJ SOLN
5.0000 mg | Freq: Four times a day (QID) | INTRAMUSCULAR | Status: DC | PRN
  Filled 2014-04-21: qty 2

## 2014-04-21 MED ORDER — GUAIFENESIN-DM 100-10 MG/5ML PO SYRP: 5.0000 mL | ORAL_SOLUTION | Freq: Four times a day (QID) | ORAL | Status: DC | PRN

## 2014-04-21 MED ORDER — FUROSEMIDE 20 MG PO TABS
20.0000 mg | ORAL_TABLET | Freq: Two times a day (BID) | ORAL | Status: DC
  Administered 2014-04-21 – 2014-04-26 (×11): 20 mg via ORAL
  Filled 2014-04-21 (×17): qty 1

## 2014-04-21 MED ORDER — INSULIN ASPART 100 UNIT/ML ~~LOC~~ SOLN: 0.0000 [IU] | Freq: Every day | SUBCUTANEOUS | Status: DC

## 2014-04-21 MED ORDER — SODIUM CHLORIDE 0.9 % IV SOLN
500.0000 mg | INTRAVENOUS | Status: DC
  Administered 2014-04-22 – 2014-04-26 (×5): 500 mg via INTRAVENOUS
  Filled 2014-04-21 (×10): qty 10

## 2014-04-21 MED ORDER — HYDROMORPHONE HCL 2 MG PO TABS
2.0000 mg | ORAL_TABLET | Freq: Four times a day (QID) | ORAL | Status: DC | PRN
  Administered 2014-04-22 – 2014-04-24 (×2): 2 mg via ORAL
  Filled 2014-04-21 (×2): qty 1

## 2014-04-21 MED ORDER — INSULIN ASPART 100 UNIT/ML ~~LOC~~ SOLN
0.0000 [IU] | Freq: Three times a day (TID) | SUBCUTANEOUS | Status: DC
  Administered 2014-04-22: 1 [IU] via SUBCUTANEOUS
  Administered 2014-04-22 – 2014-04-24 (×2): 2 [IU] via SUBCUTANEOUS
  Administered 2014-04-25 (×2): 1 [IU] via SUBCUTANEOUS
  Administered 2014-04-26 (×2): 2 [IU] via SUBCUTANEOUS

## 2014-04-21 MED ORDER — DIPHENHYDRAMINE HCL 12.5 MG/5ML PO ELIX: 12.5000 mg | ORAL_SOLUTION | Freq: Four times a day (QID) | ORAL | Status: DC | PRN

## 2014-04-21 MED ORDER — BISACODYL 10 MG RE SUPP: 10.0000 mg | Freq: Every day | RECTAL | Status: DC | PRN

## 2014-04-21 MED ORDER — TAMSULOSIN HCL 0.4 MG PO CAPS
0.4000 mg | ORAL_CAPSULE | Freq: Every day | ORAL | Status: DC
  Administered 2014-04-21 – 2014-04-26 (×6): 0.4 mg via ORAL
  Filled 2014-04-21 (×7): qty 1

## 2014-04-21 NOTE — Progress Notes (Signed)
Bilateral lower extremity venous duplex completed.  Right:  Partial DVT noted in the common femoral vein.  No evidence of superficial thrombosis.  No Baker's cyst.  Left:  No evidence of DVT, superficial thrombosis, or Baker's cyst.

## 2014-04-21 NOTE — H&P (Signed)
Physical Medicine and Rehabilitation Admission H&P  CC: Deconditioning due to VRE lumbar infection and MRSA bacteremia.  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 He was d/c to SNF from [facility] deloped purulent drainage on 03/10/14 with wound cultures positive for VRE. Hardware was removed by Dr. Tawanna Cooler and [facility] placed with recommendations for daptomycin as well as cefepime with recommendations for 8 weeks of antibiotic therapy. He was readmitted to [facility] on 03/22/14 for antibiotic as well as therapy. Sputum culture from [facility] positive for Kleb-pneumonia and cefepime was changed to meropenum on 03/26/14. ID following for input with recommendations to continue antibiotics through 05/04/14. Was on dysphagia diet with honey liquids and has been advanced to nectars at bedside on 04/09/14. 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 Back wound continues to have copious drainage with areas of dehiscence. Oxycodone scheduled bid to help with pain control. Foley was discontinued last week with incontinence reported. Po intake and endurance improving. Therapy ongoing and CIR recommended by rehab team. Patient admitted today for progressive therapies.    Review of Systems  Constitutional: Positive for malaise/fatigue.  HENT: Negative for hearing loss.  Eyes: Negative for blurred vision and double vision.  Respiratory: Negative for cough and wheezing.  Cardiovascular: Negative for chest pain and palpitations.  Gastrointestinal: Negative for heartburn, nausea and abdominal pain.  Bowel incontinence due to decrease sensation.  Genitourinary: Positive for urgency and frequency.  Hesitancy. Difficulty voiding.  Musculoskeletal: Positive for falls (due to LLE instability for months) and myalgias.  Neurological: Positive for weakness.  Negative for headaches.  Psychiatric/Behavioral: Positive for depression.   Past Medical History   Diagnosis  Date   .  Hypertension    .  Heart murmur    .  Peripheral vascular disease    .  Arthritis    .  Hepatitis  1954    Past Surgical History   Procedure  Laterality  Date   .  Cholecystectomy   1983   .  Pericardial fluid drainage   2007   .  Cataract extraction   2012   .  Tee without cardioversion   10/23/2012     Procedure: TRANSESOPHAGEAL ECHOCARDIOGRAM (TEE); Surgeon: Sanda Klein, MD; Location: [facility] ENDOSCOPY; Service: Cardiovascular; Laterality: N/A; to come [id number] for work up    Family History   Problem  Relation  Age of Onset   .  Heart disease  Father     Social History: Married. Independent but sedentary for few months prior to surgery due to left hip pain. He reports that he has quit smoking Sept '14. His smoking use included Cigarettes. He smoked 1.00 pack per day. He quit smokeless tobacco use about 2 years ago. He reports that he drinks alcohol. He reports that he does not use illicit drugs.  Allergies: Not on File  Medications Prior to Admission   Medication  Sig  Dispense  Refill   .  aspirin 81 MG tablet  Take 81 mg by mouth daily.     .  calcium carbonate (OS-CAL) 1250 MG chewable tablet  Chew 1 tablet by mouth daily. Takes 500 mg with 600 IU of vit D3     .  clopidogrel (PLAVIX) 75 MG tablet  Take 75 mg by mouth daily.     .  fesoterodine (TOVIAZ) 8 MG  TB24  Take 8 mg by mouth daily.     .  folic acid (FOLVITE) 1 MG tablet  Take 1 mg by mouth daily.     Marland Kitchen  HYDROcodone-acetaminophen (NORCO/VICODIN) 5-325 MG per tablet  Take 1 tablet by mouth every 6 (six) hours as needed.     .  leflunomide (ARAVA) 20 MG tablet  Take 20 mg by mouth daily.     .  prednisoLONE 5 MG TABS  Take 10 mg by mouth.     .  pregabalin (LYRICA) 150 MG capsule  Take 150 mg by mouth 3 (three) times daily.      Home:  Lives in 5 story house--split level home? Bedroom in basement and  can use his golf cart to get to it. Wife's bedroom on 2nd floor.  Functional History:  Independent prior to Feb 2015.  Reports was ambulating at SNF with assist but had tendency to loose balance with certain positional changes as well as LLE instability.  Functional Status:  Mobility:  Max assist for bed mobility.  Max assist for stand-pivot transfer. Steady used.    ADL:  Needs set up assist for feeding.  Independent for grooming.  Min assist for UB dressing.  Max assist for LB dressing.  Cognition:    Physical Exam:  Height 5\' 8"  (1.727 m), weight 81.647 kg (180 lb).   Constitutional:  Slumped in chair to the right and unable to right self due to malfunctioning VAC.  HENT:  Head: Normocephalic and atraumatic.  Eyes: Conjunctivae are normal. Pupils are equal, round, and reactive to light.  Neck: Normal range of motion. Neck supple.  Cardiovascular: Normal rate and regular rhythm.  Respiratory: Effort normal and breath sounds normal. No respiratory distress. He has no wheezes. Occasional cough, attempting to clear throat GI: Soft. Bowel sounds are normal. He exhibits no distension. There is no tenderness.  Musculoskeletal:  Skin: Bilateral heels boggy. Skin tears Left shin, left and right forearm covered with OpSite. 1cm tear in left antecubital fossa. Multiple skin lesions (warts?) right hand dorsally. Bilateral knees with stiffness.  Psych: pt pleasant and up beat Neuro: A and O x3. CN exam normal. Motor: UE 4- deltoid, 4 bicep, tricep, HI. LE 2+ HF, 3- KE and 3+/5 ADF/APF. No gross sensory deficits. DTR's 1+    Results for orders placed during the hospital encounter of 03/22/14 (from the past 48 hour(s))   CBC Status: Abnormal    Collection Time    04/20/14 5:35 AM   Result  Value  Ref Range    WBC  5.7  4.0 - 10.5 K/uL    RBC  3.77 (*)  4.22 - 5.81 MIL/uL    Hemoglobin  9.9 (*)  13.0 - 17.0 g/dL    HCT  06/20/14 (*)  93.2 - 52.0 %    MCV  86.7  78.0 - 100.0 fL    MCH   26.3  26.0 - 34.0 pg    MCHC  30.3  30.0 - 36.0 g/dL    RDW  63.8 (*)  41.3 - 15.5 %    Platelets  188  150 - 400 K/uL   BASIC METABOLIC PANEL Status: Abnormal    Collection Time    04/20/14 5:35 AM   Result  Value  Ref Range    Sodium  142  137 - 147 mEq/L    Potassium  4.2  3.7 - 5.3 mEq/L    Chloride  99  96 - 112  mEq/L    CO2  33 (*)  19 - 32 mEq/L    Glucose, Bld  83  70 - 99 mg/dL    BUN  18  6 - 23 mg/dL    Creatinine, Ser  0.76  0.50 - 1.35 mg/dL    Calcium  9.5  8.4 - 10.5 mg/dL    GFR calc non Af Amer  90 (*)  >90 mL/min    GFR calc Af Amer  >90  >90 mL/min    Comment:  (NOTE)     The eGFR has been calculated using the CKD EPI equation.     This calculation has not been validated in all clinical situations.     eGFR's persistently <90 mL/min signify possible Chronic Kidney     Disease.   CK Status: None    Collection Time    04/20/14 5:35 AM   Result  Value  Ref Range    Total CK  42  7 - 232 U/L   SEDIMENTATION RATE Status: Abnormal    Collection Time    04/20/14 5:35 AM   Result  Value  Ref Range    Sed Rate  59 (*)  0 - 16 mm/hr   C-REACTIVE PROTEIN Status: Abnormal    Collection Time    04/20/14 5:35 AM   Result  Value  Ref Range    CRP  2.0 (*)  <0.60 mg/dL    Comment:  Performed at [facility]    No results found.  Medical Problem List and Plan:  1. Functional deficits secondary to deconditioning from multiple medical issues--back surgery, removal of Infected hardware as well as bacteremia  2. DVT Prophylaxis/Anticoagulation: add Pharmaceutical: Lovenox. Check duplex as high risk for DVT with multiple surgeries and immobility.  3. Pain Management: Schedule oxycodone prior to therapy as well as prn doses in between. Was using dilaudid for severe pain while on [facility]. 4. Mood: Will have LCSW follow for evaluation and support.  5. Neuropsych: This patient is capable of making decisions on his own behalf.  6. Stress induced hyperglycemia: continue  to check cbgs and use SSI for elevated BS.  7. Back wound: continue VAC dressing changes MWF. Continue air mattress overlay  8. Malnutrition: continue with nutritional supplements tid.  9. RA: on chronic steroids.  10. VRE/MRSA bacteremia with Kleb PNA: Continue daptomycin and cubicin thorough 05/04/14.  11. Anemia of chronic illness: continue iron supplement. Recheck in am.    Post Admission Physician Evaluation:  1. Functional deficits secondary to deconditioning related to infected lumbar spine/surgical hardware/bacteremia. 2. Patient is admitted to receive collaborative, interdisciplinary care between the physiatrist, rehab nursing staff, and therapy team. 3. Patient's level of medical complexity and substantial therapy needs in context of that medical necessity cannot be provided at a lesser intensity of care such as a SNF. 4. Patient has experienced substantial functional loss from his/her baseline which was documented above under the "Functional History" and "Functional Status" headings. Judging by the patient's diagnosis, physical exam, and functional history, the patient has potential for functional progress which will result in measurable gains while on inpatient rehab. These gains will be of substantial and practical use upon discharge in facilitating mobility and self-care at the household level. 5. Physiatrist will provide 24 hour management of medical needs as well as oversight of the therapy plan/treatment and provide guidance as appropriate regarding the interaction of the two. 6. 24 hour rehab nursing will assist with bladder management, bowel management,  safety, skin/wound care, disease management, medication administration, pain management and patient education and help integrate therapy concepts, techniques,education, etc. 7. PT will assess and treat for/with: Lower extremity strength, range of motion, stamina, balance, functional mobility, safety, adaptive techniques and  equipment, pain mgt, vac mgt, egosupport, family and pt ed. Goals are: supervision to min assist. 8. OT will assess and treat for/with: ADL's, functional mobility, safety, upper extremity strength, adaptive techniques and equipment, vac mgt, egosupport, family and pt ed, pain mgt. Goals are: supervision to min assist. 9. SLP will assess and treat for/with: swallowing. Goals are: mod I. 10. Case Management and Social Worker will assess and treat for psychological issues and discharge planning. 11. Team conference will be held weekly to assess progress toward goals and to determine barriers to discharge. 12. Patient will receive at least 3 hours of therapy per day at least 5 days per week. 13. ELOS: 18-24 days  14. Prognosis: good  Meredith Staggers, MD, [facility]   04/21/2014

## 2014-04-21 NOTE — Progress Notes (Signed)
[facility] PMR Admission Coordinator Pre-Admission Assessment   Patient: Jon Lozano is an 73 y.o., male MRN: [id number] DOB: [birth year] Height: $RemoveBeforeDE'5\' 8"'[id number]$  (172.7 cm) Weight: 81.647 kg (180 lb)   Insurance Information HMO:     PPO:      PCP:      IPA:      80/20:      OTHER:   PRIMARY: Medicare A & B      Policy#: [id number]      Subscriber: self CM Name:       Phone#:      Fax#:   Pre-Cert#: verified in Visual merchandiser: retired Benefits:  Phone #:      Name:   Eff. Date: A & B: 10-13-07     Deduct: $1260      Out of Pocket Max: none      Life Max: unlimited CIR: 100%      SNF: 100% days 1-20; 80% days 21-100 (100 day visit max) Outpatient: 80%     Co-Pay: 20% Home Health: 100%      Co-Pay: none, no visit limits DME: 80%     Co-Pay: 20% Providers: pt's preference   Note: as of 04-21-14, pt has seven Medicare Full Inpatient Copay Days left. He will be using some of his 21 Lifetime Reserve days during this rehab stay. Pt and his wife are aware.  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 He was d/c to  SNF from [facility] deloped purulent drainage on 03/10/14 with wound cultures positive for VRE. Hardware was removed by Dr. Tawanna Cooler and [facility] placed with recommendations for daptomycin as well as cefepime with recommendations for 8 weeks of antibiotic therapy. He was readmitted to [facility] on 03/22/14 for antibiotic as  well as therapy. Sputum culture from [place] positive for K-pneumonia and cefepime was changed to meropenum on 03/26/14. ID following for input with recommendations to continue antibiotics through 05/04/14. Was on dysphagia diet with honey liquids and has been advanced to nectars at bedside on 04/09/14.  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 Back wound continues to have copious drainage with areas of dehiscence. Oxycodone scheduled bid to help with pain control. Po intake and endurance improving. Therapy ongoing and CIR recommended by rehab team. Patient admitted today for progressive therapies.     Patient's medical record from [facility] has been reviewed by the rehabilitation admission coordinator and physician.       Past Medical History  Past Medical History   Diagnosis  Date   .  Hypertension     .  Heart murmur     .  Peripheral vascular disease     .  Arthritis     .  Hepatitis  1953  Family History  family history is not on file.   Prior Rehab/Hospitalizations: has had a prolonged medical course since original back surgery in Feb.2015 and has been at two different SNF's and was at [facility] on two different admissions. He has had inpatient therapies while at [facility] and SNFs.         Current Medications See MAR   Patients Current Diet:  Dysphagia 3 with nectar thick   Precautions / Restrictions Precautions Precautions: Fall (Contact Precautions (VRE and MRSA), previous back wound VAC)    Per PT at [facility], pt does not need a back brace and has not been wearing one.   Prior Activity Level Limited Community (1-2x/wk): Prior to having first surgery in Feb, pt was independent but limited in his activity by his sciatica. He was driving and used a cane when the back pain increased. 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    Home Assistive Devices /  Equipment Home Assistive Devices/Equipment: Cane (specify quad or straight) (straight cane)      Prior Functional Level  Current Functional Level   Bed Mobility   Independent   Max assist (max assist with log rolling)    Transfers   Independent   Max assist (max assist stand pivot (also using the Steady Assist))    Mobility - Walk/Wheelchair   Independent   Other (only taking limited steps to chair during transfer)    Upper Body Dressing   Independent   Min assist    Lower Body Dressing   Independent   Max assist    Grooming   Independent   Other (set up assistance)    Eating/Drinking   Independent   Other (set up assistance)    Toilet Transfer   Independent   (has been using bedpan)    Bladder Continence     WFL   using urinal, some new incontinence since foley discontinued    Bowel Management   WFL   last BM on 6-9, no incontinence    Stair Climbing   Independent but stated that the steps were getting harder for him to do due to LE weakness and back pain   Other (not assessed)    Communication   [facility]   [facility]    Memory   WFL   appears WFL    Cooking/Meal Prep   Independent (wife also helped with cooking)        Housework   Independent       Money Management   WFL      Driving   Independent         Special needs/care consideration BiPAP/CPAP no   CPM no   Continuous Drip IV no   Dialysis no         Life Vest no Oxygen no   Special Bed no   Trach Size no Wound Vac (area) - yes      Location - lumbar region Skin - multiple skin issues: Bilateral heels boggy. Skin tears Left shin, left and right forearm covered with OpSite.  1cm tear in left antecubital fossa.  Multiple skin lesions (warts?) right hand dorsally.                                Bowel mgmt: last BM on 6-9 (pt was currently wearing Depends at [facility]) Bladder mgmt: using urinal but having  some new incontinence   Diabetic mgmt no   Previous Home Environment Living  Arrangements: Spouse/significant other  Lives With: Spouse Available Help at Discharge: Other (Comment) (wife is physically limited in her ability to help pt. Plan to hire caregivers as needed, depending on level of assist pt will need at discharge) Type of Home: House Home Layout: Multi-level;Able to live on main level with bedroom/bathroom (plan is to live in basement apartment of 5 story home. Pt had been staying on basement floor in this apartment prior to having back surgery in Feb.) Alternate Level Stairs-Number of Steps: flight of steps to basement apartment but level entrance from outside Home Access: Level entry (back door level entrance to apartment via golfcart per pt) [place]:  (none prior to getting sick in Feb., Anticipate a need for for home health follow up)   Discharge Living Setting Plans for Discharge Living Setting: Patient's home Type of Home at Discharge: House Discharge Home Layout: Multi-level;Able to live on main level with bedroom/bathroom (see above comments) Alternate Level Stairs-Number of Steps: flight of steps to basement apartment Discharge Home Access: Level entry (back door level entrance to apartment via golf cart per pt) Does the patient have any problems obtaining your medications?: No   Social/Family/Support Systems Patient Roles: Spouse Contact Information: wife Santiago Glad is primary contact Anticipated Caregiver: wife and they will hire caregivers as needed Anticipated Caregiver's Contact Information: see above Ability/Limitations of Caregiver: wife can provide supervision but minimal physical assistance. They plan to hire caregivers as needed for physical help, depending on pt's overall progress. Caregiver Availability: 24/7 Discharge Plan Discussed with Primary Caregiver: Yes (discussed by phone with wife on 6-10 ) Is Caregiver In Agreement with Plan?: Yes Does Caregiver/Family have Issues with Lodging/Transportation while Pt is in Rehab?:  No   Goals/Additional Needs Patient/Family Goal for Rehab: minimal assistance and supervision for PT (at a household level) and OT, supervision for SLP Expected length of stay: 11-13 days Cultural Considerations: none Dietary Needs: Dysphagia III with nectar thick Equipment Needs: to be determined Pt/Family Agrees to Admission and willing to participate: Yes (discussed with pt on 6-9 and 6-10 and with wife by phone ) Program Orientation Provided & Reviewed with Pt/Caregiver Including Roles  & Responsibilities: Yes   Patient Condition: I met with patient at [facility] on 04-20-14 and 04-21-14 and explained the purpose and possibility of acute inpatient rehabilitation. I also spoke with pt's wife by phone on 04-21-14 and answered her questions about inpatient rehab. This patient has had an involved medical course beginning in February 2015 with lumbar back surgery and subsequent wound infection/hardware removal and pneumonia. He was previously independent prior to this time but did have some activity limitations due to his sciatica. Patient is currently requiring maximal assistance with bed mobility and limited stand pivot transfers and is taking minimal steps during the transfer. He is needing minimal to maximal assistance with self care needs. In addition, patient is currently on a dysphagia III diet with nectar thick liquids. Pt will benefit from further skilled speech services to progress his diet and determine any higher level cognitive needs due to his prolonged medical course. He will benefit greatly from the multi-disciplinary team of skilled PT, OT, SLP and rehab nursing to maximize his functional return. Skilled rehab nursing can focus on skin integrity issues and management of wound VAC needs in addition to assisting patient in new issue of urinary incontinence following removal of foley catheter.  PT and OT  will focus on increasing his overall strength and activity tolerance for  greater independence in bed mobility, transfers, household gait and self care tasks. In addition, patient will benefit from rehab physician intervention to monitor his wound VAC needs/progression and his medical management with history of VRE and MRSA in light of his prolonged medical course since February. Discussed case with Dr. Naaman Plummer and rehab team and MD feels that pt is a good inpatient rehab candidate. Received medical clearance from Dr. Laren Everts at [facility]. Patient and his wife are motivated to come to inpatient rehab and will benefit from the intensive services of skilled therapy under rehab physician guidance. Patient will be admitted today on 04-21-14.   Preadmission Screen Completed By:  Ave Filter, PT 04/21/2014 12:42 PM ______________________________________________________________________    Discussed status with Dr. Naaman Plummer on 04-21-14 at 1345 and received telephone approval for admission today.   Admission Coordinator:  Cain Fitzhenry L, PT time 1345/Date 04-21-14    Assessment/Plan: Diagnosis: deconditioning after lumbar spine/hardware infection Does the need for close, 24 hr/day  Medical supervision in concert with the patient's rehab needs make it unreasonable for this patient to be served in a less intensive setting? Yes Co-Morbidities requiring supervision/potential complications: wound care, htn, pain   Due to bladder management, bowel management, safety, skin/wound care, disease management, medication administration, pain management and patient education, does the patient require 24 hr/day rehab nursing? Yes Does the patient require coordinated care of a physician, rehab nurse, PT (1-2 hrs/day, 5 days/week), OT (1-2 hrs/day, 5 days/week) and SLP (1-2 hrs/day, 5 days/week) to address physical and functional deficits in the context of the above medical diagnosis(es)? Yes Addressing deficits in the following areas: balance, endurance, locomotion, strength,  transferring, bowel/bladder control, bathing, dressing, feeding, grooming, toileting, cognition, swallowing and psychosocial support Can the patient actively participate in an intensive therapy program of at least 3 hrs of therapy 5 days a week? Yes The potential for patient to make measurable gains while on inpatient rehab is excellent Anticipated functional outcomes upon discharge from inpatients are: supervision and min assist PT, supervision and min assist OT, modified independent and supervision SLP Estimated rehab length of stay to reach the above functional goals is: 12-14 days Does the patient have adequate social supports to accommodate these discharge functional goals? Yes Anticipated D/C setting: Home Anticipated post D/C treatments: HH therapy Overall Rehab/Functional Prognosis: excellent       RECOMMENDATIONS: This patient's condition is appropriate for continued rehabilitative care in the following setting: CIR Patient has agreed to participate in recommended program. Yes Note that insurance prior authorization may be required for reimbursement for recommended care.   Comment: Admit to CIR today   Meredith Staggers, MD, [place], [place] 04/21/2014  Revision History...     Date/Time User Action   04/21/2014 2:11 PM Meredith Staggers, MD Sign   04/21/2014 1:46 PM [place] [facility]  View Details Report

## 2014-04-21 NOTE — Progress Notes (Signed)
ANTIBIOTIC CONSULT NOTE - INITIAL  Pharmacy Consult for Dapto and Merrem Indication: Bacteremia  Not on File  Patient Measurements:   Adjusted Body Weight:    Vital Signs:   Intake/Output from previous day:   Intake/Output from this shift:    Labs:  Recent Labs  04/20/14 0535  WBC 5.7  HGB 9.9*  PLT 188  CREATININE 0.76   The CrCl is unknown because both a height and weight (above a minimum accepted value) are required for this calculation. No results found for this basename: VANCOTROUGH, Leodis Binet, VANCORANDOM, GENTTROUGH, GENTPEAK, GENTRANDOM, TOBRATROUGH, TOBRAPEAK, TOBRARND, AMIKACINPEAK, AMIKACINTROU, AMIKACIN,  in the last 72 hours   Microbiology: Recent Results (from the past 720 hour(s))  URINE CULTURE     Status: None   Collection Time    03/23/14  6:23 AM      Result Value Ref Range Status   Specimen Description URINE, RANDOM   Final   Special Requests NONE   Final   Culture  Setup Time     Final   Value: 03/23/2014 06:33     Performed at [facility] Count     Final   Value: >=100,000 COLONIES/ML     Performed at [facility]   Culture     Final   Value: YEAST     Performed at [facility]   Report Status 03/24/2014 FINAL   Final  CULTURE, BLOOD (ROUTINE X 2)     Status: None   Collection Time    04/06/14  3:30 PM      Result Value Ref Range Status   Specimen Description BLOOD LEFT ARM   Final   Special Requests BOTTLES DRAWN AEROBIC ONLY 3CC   Final   Culture  Setup Time     Final   Value: 04/06/2014 20:05     Performed at [facility]   Culture     Final   Value: NO GROWTH 5 DAYS     Performed at [facility]   Report Status 04/12/2014 FINAL   Final  CULTURE, BLOOD (ROUTINE X 2)     Status: None   Collection Time    04/06/14  3:37 PM      Result Value Ref Range Status   Specimen Description BLOOD LEFT ARM   Final   Special Requests BOTTLES DRAWN AEROBIC ONLY 3CC   Final   Culture  Setup  Time     Final   Value: 04/06/2014 20:05     Performed at [facility]   Culture     Final   Value: NO GROWTH 5 DAYS     Performed at [facility]   Report Status 04/12/2014 FINAL   Final    Medical History: Past Medical History  Diagnosis Date  . Hypertension   . Heart murmur   . Peripheral vascular disease   . Arthritis   . Hepatitis 1953    Medications:  Prescriptions prior to admission  Medication Sig Dispense Refill  . aspirin 81 MG tablet Take 81 mg by mouth daily.      . calcium carbonate (OS-CAL) 1250 MG chewable tablet Chew 1 tablet by mouth daily. Takes 500 mg with 600 IU of vit D3      . clopidogrel (PLAVIX) 75 MG tablet Take 75 mg by mouth daily.      . fesoterodine (TOVIAZ) 8 MG TB24 Take 8 mg by mouth daily.      Marland Kitchen  folic acid (FOLVITE) 1 MG tablet Take 1 mg by mouth daily.      Marland Kitchen HYDROcodone-acetaminophen (NORCO/VICODIN) 5-325 MG per tablet Take 1 tablet by mouth every 6 (six) hours as needed.      . leflunomide (ARAVA) 20 MG tablet Take 20 mg by mouth daily.      . prednisoLONE 5 MG TABS Take 10 mg by mouth.      . pregabalin (LYRICA) 150 MG capsule Take 150 mg by mouth 3 (three) times daily.       Assessment: Abx continuation from [facility] 73 y/o M underwent back surgery 01/04/14 complicated by 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 At SNF he developed purulent drainage on 03/10/14 with wound cultures positive for VRE. Hardware was removed by Dr. Alger Memos and [facility] placed with recommendations for daptomycin/cefepime for 8 weeks of antibiotic therapy. Cefepime changed to Merrem on 03/26/14 for Kleb. Pneumonia in sputum. Back wound continues to have copious drainage with areas of dehiscence. Plan to con't abx through 05/04/14.  Cubicin 03/10/14>> Cefepime 4/29>>5/15 Merrem 5/15>>  Last doses at [facility]:  -Cubicin 500mg  daily (missed dose 6/9?), dose 0800 6/10 -Merrem: last dose 6/10 1400  Goal of Therapy:  8 week  treatment of VRE wound infection s/p back surgery  Plan:  Cubicin 500mg  IV daily (6mg /kg) Merrem 1g IV q8hr. Next dose 2200  Yoshua Geisinger S. 8/9, PharmD, BCPS Clinical Staff Pharmacist Pager [phone]  [place] 04/21/2014,4:43 PM

## 2014-04-21 NOTE — PMR Pre-admission (Signed)
[facility] PMR Admission Coordinator Pre-Admission Assessment  Patient: Jon Lozano is an 73 y.o., male MRN: [id number] DOB: [birth year] Height: 5\' 8"  (172.7 cm) Weight: 81.647 kg (180 lb)  Insurance Information HMO:     PPO:      PCP:      IPA:      80/20:      OTHER:  PRIMARY: Medicare A & B      Policy#: [id number]      Subscriber: self CM Name:       Phone#:      Fax#:  Pre-Cert#: verified in Visual merchandiser: retired Benefits:  Phone #:      Name:  Eff. Date: A & B: 10-13-07     Deduct: $1260      Out of Pocket Max: none      Life Max: unlimited CIR: 100%      SNF: 100% days 1-20; 80% days 21-100 (100 day visit max) Outpatient: 80%     Co-Pay: 20% Home Health: 100%      Co-Pay: none, no visit limits DME: 80%     Co-Pay: 20% Providers: pt's preference  Note: as of 04-21-14, pt has seven Medicare Full Inpatient Copay Days left. He will be using some of his 39 Lifetime Reserve days during this rehab stay. Pt and his wife are aware.  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 He was d/c to  SNF from [facility] deloped purulent drainage on 03/10/14 with wound cultures positive for VRE. Hardware was removed by Dr. Tawanna Cooler and [facility] placed with recommendations for daptomycin as well as cefepime with recommendations for 8 weeks of antibiotic therapy. He was readmitted to [facility] on 03/22/14 for antibiotic as well as therapy. Sputum  culture from [place] positive for K-pneumonia and cefepime was changed to meropenum on 03/26/14. ID following for input with recommendations to continue antibiotics through 05/04/14. Was on dysphagia diet with honey liquids and has been advanced to nectars at bedside on 04/09/14.  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 Back wound continues to have copious drainage with areas of dehiscence. Oxycodone scheduled bid to help with pain control. Po intake and endurance improving. Therapy ongoing and CIR recommended by rehab team. Patient admitted today for progressive therapies.    Patient's medical record from [facility] has been reviewed by the rehabilitation admission coordinator and physician.    Past Medical History  Past Medical History  Diagnosis Date  . Hypertension   . Heart murmur   . Peripheral vascular disease   . Arthritis   . Hepatitis 1954    Family History  family history is not on file.  Prior Rehab/Hospitalizations: has had a prolonged medical course since original back surgery in Feb.2015 and has been at two different SNF's and was at [facility] on two different admissions. He has had  inpatient therapies while at [facility] and SNFs.    Current Medications See MAR  Patients Current Diet:  Dysphagia 3 with nectar thick  Precautions / Restrictions Precautions Precautions: Fall (Contact Precautions (VRE and MRSA), previous back wound VAC)   Per PT at [facility], pt does not need a back brace and has not been wearing one.  Prior Activity Level Limited Community (1-2x/wk): Prior to having first surgery in Feb, pt was independent but limited in his activity by his sciatica. He was driving and used a cane when the back pain increased.  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   Home Assistive Devices / Equipment Home Assistive Devices/Equipment: Cane (specify quad or straight)  (straight cane)   Prior Functional Level Current Functional Level  Bed Mobility  Independent  Max assist (max assist with log rolling)   Transfers  Independent  Max assist (max assist stand pivot (also using the Steady Assist))   Mobility - Walk/Wheelchair  Independent  Other (only taking limited steps to chair during transfer)   Upper Body Dressing  Independent  Min assist   Lower Body Dressing  Independent  Max assist   Grooming  Independent  Other (set up assistance)   Eating/Drinking  Independent  Other (set up assistance)   Toilet Transfer  Independent   (has been using bedpan)   Bladder Continence   WFL  using urinal, some new incontinence since foley discontinued   Bowel Management  WFL  last BM on 6-9, no incontinence   Stair Climbing   Independent but stated that the steps were getting harder for him to do due to LE weakness and back pain  Other (not assessed)   Communication  [facility]  [facility]   Memory  WFL  appears WFL   Cooking/Meal Prep  Independent (wife also helped with cooking)      Housework  Independent     Money Management  WFL    Driving   Independent     Special needs/care consideration BiPAP/CPAP no  CPM no  Continuous Drip IV no  Dialysis no        Life Vest no Oxygen no  Special Bed no  Trach Size no Wound Vac (area) - yes      Location - lumbar region Skin - multiple skin issues: Bilateral heels boggy. Skin tears Left shin, left and right forearm covered with OpSite.  1cm tear in left antecubital fossa.  Multiple skin lesions (warts?) right hand dorsally.                              Bowel mgmt: last BM on 6-9 (pt was currently wearing Depends at [facility]) Bladder mgmt: using urinal but having some new incontinence  Diabetic mgmt no  Previous Home Environment Living Arrangements: Spouse/significant other  Lives With: Spouse Available Help at Discharge: Other (Comment) (wife is physically limited in her ability to  help pt. Plan to hire caregivers as needed, depending on level of assist pt will need at discharge) Type of Home: House Home Layout: Multi-level;Able to live on main level with bedroom/bathroom (plan is to live in basement apartment of 5 story home. Pt had been staying on basement floor in this apartment prior to having back surgery in Feb.) Alternate Level Stairs-Number of Steps: flight of steps to basement apartment but level entrance from outside Home Access: Level entry (back door  level entrance to apartment via golfcart per pt) Home Care Services:  (none prior to getting sick in Feb., Anticipate a need for for home health follow up)  Discharge Living Setting Plans for Discharge Living Setting: Patient's home Type of Home at Discharge: House Discharge Home Layout: Multi-level;Able to live on main level with bedroom/bathroom (see above comments) Alternate Level Stairs-Number of Steps: flight of steps to basement apartment Discharge Home Access: Level entry (back door level entrance to apartment via golf cart per pt) Does the patient have any problems obtaining your medications?: No  Social/Family/Support Systems Patient Roles: Spouse Contact Information: wife Santiago Glad is primary contact Anticipated Caregiver: wife and they will hire caregivers as needed Anticipated Caregiver's Contact Information: see above Ability/Limitations of Caregiver: wife can provide supervision but minimal physical assistance. They plan to hire caregivers as needed for physical help, depending on pt's overall progress. Caregiver Availability: 24/7 Discharge Plan Discussed with Primary Caregiver: Yes (discussed by phone with wife on 6-10 ) Is Caregiver In Agreement with Plan?: Yes Does Caregiver/Family have Issues with Lodging/Transportation while Pt is in Rehab?: No  Goals/Additional Needs Patient/Family Goal for Rehab: minimal assistance and supervision for PT (at a household level) and OT, supervision for  SLP Expected length of stay: 11-13 days Cultural Considerations: none Dietary Needs: Dysphagia III with nectar thick Equipment Needs: to be determined Pt/Family Agrees to Admission and willing to participate: Yes (discussed with pt on 6-9 and 6-10 and with wife by phone ) Program Orientation Provided & Reviewed with Pt/Caregiver Including Roles  & Responsibilities: Yes  Patient Condition: I met with patient at [facility] on 04-20-14 and 04-21-14 and explained the purpose and possibility of acute inpatient rehabilitation. I also spoke with pt's wife by phone on 04-21-14 and answered her questions about inpatient rehab. This patient has had an involved medical course beginning in February 2015 with lumbar back surgery and subsequent wound infection/hardware removal and pneumonia. He was previously independent prior to this time but did have some activity limitations due to his sciatica. Patient is currently requiring maximal assistance with bed mobility and limited stand pivot transfers and is taking minimal steps during the transfer. He is needing minimal to maximal assistance with self care needs. In addition, patient is currently on a dysphagia III diet with nectar thick liquids. Pt will benefit from further skilled speech services to progress his diet and determine any higher level cognitive needs due to his prolonged medical course. He will benefit greatly from the multi-disciplinary team of skilled PT, OT, SLP and rehab nursing to maximize his functional return. Skilled rehab nursing can focus on skin integrity issues and management of wound VAC needs in addition to assisting patient in new issue of urinary incontinence following removal of foley catheter.  PT and OT will focus on increasing his overall strength and activity tolerance for greater independence in bed mobility, transfers, household gait and self care tasks. In addition, patient will benefit from rehab physician intervention to  monitor his wound VAC needs/progression and his medical management with history of VRE and MRSA in light of his prolonged medical course since February. Discussed case with Dr. Naaman Plummer and rehab team and MD feels that pt is a good inpatient rehab candidate. Received medical clearance from Dr. Laren Everts at [facility]. Patient and his wife are motivated to come to inpatient rehab and will benefit from the intensive services of skilled therapy under rehab physician guidance. Patient will be admitted today on 04-21-14.  Preadmission Screen  Completed By:  Ave Filter, PT 04/21/2014 12:42 PM ______________________________________________________________________   Discussed status with Dr. Naaman Plummer on 04-21-14 at 1345 and received telephone approval for admission today.  Admission Coordinator:  Turnington,Janine L, PT time 1345/Date 04-21-14   Assessment/Plan: Diagnosis: deconditioning after lumbar spine/hardware infection 1. Does the need for close, 24 hr/day  Medical supervision in concert with the patient's rehab needs make it unreasonable for this patient to be served in a less intensive setting? Yes 2. Co-Morbidities requiring supervision/potential complications: wound care, htn, pain  3. Due to bladder management, bowel management, safety, skin/wound care, disease management, medication administration, pain management and patient education, does the patient require 24 hr/day rehab nursing? Yes 4. Does the patient require coordinated care of a physician, rehab nurse, PT (1-2 hrs/day, 5 days/week), OT (1-2 hrs/day, 5 days/week) and SLP (1-2 hrs/day, 5 days/week) to address physical and functional deficits in the context of the above medical diagnosis(es)? Yes Addressing deficits in the following areas: balance, endurance, locomotion, strength, transferring, bowel/bladder control, bathing, dressing, feeding, grooming, toileting, cognition, swallowing and psychosocial support 5. Can the  patient actively participate in an intensive therapy program of at least 3 hrs of therapy 5 days a week? Yes 6. The potential for patient to make measurable gains while on inpatient rehab is excellent 7. Anticipated functional outcomes upon discharge from inpatients are: supervision and min assist PT, supervision and min assist OT, modified independent and supervision SLP 8. Estimated rehab length of stay to reach the above functional goals is: 12-14 days 9. Does the patient have adequate social supports to accommodate these discharge functional goals? Yes 10. Anticipated D/C setting: Home 11. Anticipated post D/C treatments: [place] therapy 12. Overall Rehab/Functional Prognosis: excellent    RECOMMENDATIONS: This patient's condition is appropriate for continued rehabilitative care in the following setting: CIR Patient has agreed to participate in recommended program. Yes Note that insurance prior authorization may be required for reimbursement for recommended care.  Comment: Admit to CIR today  Meredith Staggers, MD, [place], [place] 04/21/2014

## 2014-04-22 ENCOUNTER — Inpatient Hospital Stay (HOSPITAL_COMMUNITY): Payer: Medicare Other | Admitting: Occupational Therapy

## 2014-04-22 ENCOUNTER — Inpatient Hospital Stay (HOSPITAL_COMMUNITY): Payer: Medicare Other

## 2014-04-22 ENCOUNTER — Inpatient Hospital Stay (HOSPITAL_COMMUNITY): Payer: Medicare Other | Admitting: Speech Pathology

## 2014-04-22 DIAGNOSIS — R5381 Other malaise: Secondary | ICD-10-CM

## 2014-04-22 LAB — COMPREHENSIVE METABOLIC PANEL
ALK PHOS: 148 U/L — AB (ref 39–117)
ALT: 14 U/L (ref 0–53)
AST: 25 U/L (ref 0–37)
Albumin: 2.7 g/dL — ABNORMAL LOW (ref 3.5–5.2)
BILIRUBIN TOTAL: 0.8 mg/dL (ref 0.3–1.2)
BUN: 20 mg/dL (ref 6–23)
CHLORIDE: 97 meq/L (ref 96–112)
CO2: 29 mEq/L (ref 19–32)
CREATININE: 0.68 mg/dL (ref 0.50–1.35)
Calcium: 9.1 mg/dL (ref 8.4–10.5)
GFR calc Af Amer: >90 mL/min (ref >90)
GFR calc non Af Amer: >90 mL/min (ref >90)
GLUCOSE: 86 mg/dL (ref 70–99)
Potassium: 3.5 mEq/L — ABNORMAL LOW (ref 3.7–5.3)
Sodium: 141 mEq/L (ref 137–147)
Total Protein: 6.6 g/dL (ref 6.0–8.3)

## 2014-04-22 LAB — GLUCOSE, CAPILLARY
GLUCOSE-CAPILLARY: 81 mg/dL (ref 70–99)
GLUCOSE-CAPILLARY: 165 mg/dL — AB (ref 70–99)
Glucose-Capillary: 156 mg/dL — ABNORMAL HIGH (ref 70–99)
Glucose-Capillary: 145 mg/dL — ABNORMAL HIGH (ref 70–99)

## 2014-04-22 LAB — CBC WITH DIFFERENTIAL/PLATELET
Basophils Absolute: 0 10*3/uL (ref 0.0–0.1)
Basophils Relative: 1 % (ref 0–1)
Eosinophils Absolute: 0.2 10*3/uL (ref 0.0–0.7)
Eosinophils Relative: 3 % (ref 0–5)
HEMATOCRIT: 29.3 % — AB (ref 39.0–52.0)
Hemoglobin: 9.2 g/dL — ABNORMAL LOW (ref 13.0–17.0)
LYMPHS ABS: 0.4 10*3/uL — AB (ref 0.7–4.0)
Lymphocytes Relative: 7 % — ABNORMAL LOW (ref 12–46)
MCH: 27.1 pg (ref 26.0–34.0)
MCHC: 31.4 g/dL (ref 30.0–36.0)
MCV: 86.2 fL (ref 78.0–100.0)
MONO ABS: 0.9 10*3/uL (ref 0.1–1.0)
MONOS PCT: 17 % — AB (ref 3–12)
NEUTROS ABS: 3.9 10*3/uL (ref 1.7–7.7)
NEUTROS PCT: 72 % (ref 43–77)
Platelets: 171 10*3/uL (ref 150–400)
RBC: 3.4 MIL/uL — AB (ref 4.22–5.81)
RDW: 16.6 % — ABNORMAL HIGH (ref 11.5–15.5)
WBC: 5.3 10*3/uL (ref 4.0–10.5)

## 2014-04-22 LAB — MRSA PCR SCREENING: MRSA by PCR: NEGATIVE

## 2014-04-22 MED ORDER — BIOTENE DRY MOUTH MT LIQD
15.0000 mL | Freq: Two times a day (BID) | OROMUCOSAL | Status: DC
  Administered 2014-04-22 – 2014-04-26 (×8): 15 mL via OROMUCOSAL

## 2014-04-22 MED ORDER — SODIUM CHLORIDE 0.9 % IJ SOLN
10.0000 mL | INTRAMUSCULAR | Status: DC | PRN
  Administered 2014-04-22: 10 mL
  Administered 2014-04-22 (×3): 20 mL
  Administered 2014-04-22 – 2014-04-23 (×3): 10 mL
  Administered 2014-04-23: 20 mL
  Administered 2014-04-24 – 2014-04-25 (×2): 10 mL
  Administered 2014-04-25: 20 mL
  Administered 2014-04-25 – 2014-04-26 (×2): 10 mL

## 2014-04-22 MED ORDER — OXYCODONE HCL 5 MG PO TABS
10.0000 mg | ORAL_TABLET | Freq: Two times a day (BID) | ORAL | Status: DC
  Administered 2014-04-23 – 2014-04-26 (×7): 10 mg via ORAL
  Filled 2014-04-22 (×8): qty 2

## 2014-04-22 MED ORDER — RESOURCE THICKENUP CLEAR PO POWD
ORAL | Status: DC | PRN
  Filled 2014-04-22: qty 125

## 2014-04-22 MED ORDER — COLLAGENASE 250 UNIT/GM EX OINT
TOPICAL_OINTMENT | Freq: Every day | CUTANEOUS | Status: DC
  Administered 2014-04-22 – 2014-04-26 (×6): via TOPICAL
  Filled 2014-04-22: qty 30

## 2014-04-22 MED ORDER — POTASSIUM CHLORIDE CRYS ER 20 MEQ PO TBCR
20.0000 meq | EXTENDED_RELEASE_TABLET | Freq: Two times a day (BID) | ORAL | Status: DC
  Administered 2014-04-22 – 2014-04-26 (×9): 20 meq via ORAL
  Filled 2014-04-22 (×12): qty 1

## 2014-04-22 NOTE — Progress Notes (Signed)
VRE lumbar infection and MRSA bacteremia.  HPI: [name] is a 73 year old male with history of HTN, PVD, COPD, Low back pain with radiculopathy LLE who underwent back surgery 28/78/67 complicated by 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 He was d/c to SNF from [facility] deloped purulent drainage on 03/10/14 with wound cultures positive for VRE. Hardware was removed by Dr. Tawanna Cooler and [facility] placed with recommendations for daptomycin as well as cefepime with recommendations for 8 weeks of antibiotic therapy. He was readmitted to [facility] on 03/22/14 for antibiotic as well as therapy. Sputum culture from [place] positive for Kleb-pneumonia and cefepime was changed to meropenum on 03/26/14. ID following for input with recommendations to continue antibiotics through 05/04/14. Was on dysphagia diet with honey liquids and has been advanced to nectars at bedside on 04/09/14. 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 Back wound continues to have copious drainage with areas of dehiscence  Subjective/Complaints: Back pain intermittent Slept ok but required repositioning to unkink wound vac tubing  Review of Systems - Negative except intermittent pain, mostly back but also "general aches and pain" no shooting pain into legs Objective: Vital Signs: Blood pressure 95/64, pulse 76, temperature 97.3 F (36.3 C), temperature source Oral, resp. rate 18, height _0  (1.727 m), weight 80.74 kg (178 lb), SpO2 98.00%. No results found. Results for orders placed during the hospital encounter of 04/21/14 (from the past 72 hour(s))  GLUCOSE, CAPILLARY     Status: Abnormal   Collection Time    04/21/14  9:15 PM      Result Value Ref Range   Glucose-Capillary 185 (*) 70 - 99 mg/dL  CBC WITH DIFFERENTIAL     Status: Abnormal   Collection Time    04/22/14  6:10 AM      Result Value Ref Range   WBC 5.3  4.0 - 10.5 K/uL   RBC 3.40 (*) 4.22 - 5.81 MIL/uL   Hemoglobin 9.2 (*) 13.0 - 17.0 g/dL   HCT  29.3 (*) 39.0 - 52.0 %   MCV 86.2  78.0 - 100.0 fL   MCH 27.1  26.0 - 34.0 pg   MCHC 31.4  30.0 - 36.0 g/dL   RDW 16.6 (*) 11.5 - 15.5 %   Platelets 171  150 - 400 K/uL   Neutrophils Relative % 72  43 - 77 %   Neutro Abs 3.9  1.7 - 7.7 K/uL   Lymphocytes Relative 7 (*) 12 - 46 %   Lymphs Abs 0.4 (*) 0.7 - 4.0 K/uL   Monocytes Relative 17 (*) 3 - 12 %   Monocytes Absolute 0.9  0.1 - 1.0 K/uL   Eosinophils Relative 3  0 - 5 %   Eosinophils Absolute 0.2  0.0 - 0.7 K/uL   Basophils Relative 1  0 - 1 %   Basophils Absolute 0.0  0.0 - 0.1 K/uL  COMPREHENSIVE METABOLIC PANEL     Status: Abnormal   Collection Time    04/22/14  6:10 AM      Result Value Ref Range   Sodium 141  137 - 147 mEq/L   Potassium 3.5 (*) 3.7 - 5.3 mEq/L   Chloride 97  96 - 112 mEq/L   CO2 29  19 - 32 mEq/L   Glucose, Bld 86  70 - 99 mg/dL   BUN 20  6 - 23 mg/dL   Creatinine, Ser 0.68  0.50 - 1.35 mg/dL   Calcium 9.1  8.4 -  10.5 mg/dL   Total Protein 6.6  6.0 - 8.3 g/dL   Albumin 2.7 (*) 3.5 - 5.2 g/dL   AST 25  0 - 37 U/L   ALT 14  0 - 53 U/L   Alkaline Phosphatase 148 (*) 39 - 117 U/L   Total Bilirubin 0.8  0.3 - 1.2 mg/dL   GFR calc non Af Amer >90  >90 mL/min   GFR calc Af Amer >90  >90 mL/min   Comment: (NOTE)     The eGFR has been calculated using the CKD EPI equation.     This calculation has not been validated in all clinical situations.     eGFR's persistently <90 mL/min signify possible Chronic Kidney     Disease.  GLUCOSE, CAPILLARY     Status: None   Collection Time    04/22/14  7:12 AM      Result Value Ref Range   Glucose-Capillary 81  70 - 99 mg/dL   Comment 1 Notify RN       HEENT: normal Cardio: RRR and no murmur Resp: CTA B/L and unlabored GI: BS positive and NT,ND Extremity:  Pulses positive and No Edema Skin:   Breakdown skin tears on shins, no heel breakdown Neuro: Lethargic, Abnormal Sensory reduced sensory Left L5 and S1 dermatomes and Abnormal Motor 4/5 BUE except 3- at  delt Musc/Skel:  LB tender GEN NAD   Assessment/Plan: 1. Functional deficits secondary to deconditioning after wound infection post lumbar fusion which require 3+ hours per day of interdisciplinary therapy in a comprehensive inpatient rehab setting. Physiatrist is providing close team supervision and 24 hour management of active medical problems listed below. Physiatrist and rehab team continue to assess barriers to discharge/monitor patient progress toward functional and medical goals. FIM:                   Comprehension Comprehension Mode: Auditory Comprehension: 4-Understands basic 75 - 89% of the time/requires cueing 10 - 24% of the time  Expression Expression Mode: Verbal Expression: 4-Expresses basic 75 - 89% of the time/requires cueing 10 - 24% of the time. Needs helper to occlude trach/needs to repeat words.  Social Interaction Social Interaction: 3-Interacts appropriately 50 - 74% of the time - May be physically or verbally inappropriate.  Problem Solving Problem Solving: 2-Solves basic 25 - 49% of the time - needs direction more than half the time to initiate, plan or complete simple activities  Memory Memory: 2-Recognizes or recalls 25 - 49% of the time/requires cueing 51 - 75% of the time  Medical Problem List and Plan:  1. Functional deficits secondary to deconditioning from multiple medical issues--back surgery, removal of Infected hardware as well as bacteremia  2. DVT Prophylaxis/Anticoagulation: add Pharmaceutical: Lovenox. Check duplex as high risk for DVT with multiple surgeries and immobility.  3. Pain Management: Schedule oxycodone prior to therapy as well as prn doses in between. Was using dilaudid for severe pain while on [facility].  4. Mood: Will have LCSW follow for evaluation and support.  5. Neuropsych: This patient is capable of making decisions on his own behalf.  6. Stress induced hyperglycemia: continue to check cbgs and use SSI for elevated  BS.  7. Back wound: continue VAC dressing changes MWF. Continue air mattress overlay  8. Malnutrition: continue with nutritional supplements tid.  9. RA: on chronic steroids.  10. VRE/MRSA bacteremia with Kleb PNA: Continue daptomycin and cubicin thorough 05/04/14.  11. Anemia of chronic illness: continue iron supplement. Recheck  in am.    LOS (Days) 1 A FACE TO FACE EVALUATION WAS PERFORMED  KIRSTEINS,ANDREW E 04/22/2014, 7:30 AM

## 2014-04-22 NOTE — Progress Notes (Signed)
Per therapy report, patient states he was in so much pain that he could not fully participate with therapy despite scheduled oxycodone.  Upon assessing pain, patient consistently states his pain is a 3/10, but that is at rest.  PRN dilaudid given with little pain relief during therapy.  Marissa Nestle, PA notified of difficulty controlling pain; new order received, will continue to monitor.

## 2014-04-22 NOTE — Consult Note (Addendum)
WOC wound consult note Reason for Consult: Consult requested for back wound.  Pt had surgery to his back this spring at another hospital and negative pressure device was applied post-op.  Marissa Nestle, PA at bedside to assess wound and discuss plan of care.   Wound type: 2 areas of full thickness wounds over previous middle back surgical site where wound apparently dehisced. Wounds communicate underneath the surface. Spinal bone palpable when probed with swab. Measurement:4X2X1cm and .2X.2X1cm.  Tunneling below lower wound is located at 6:00 o'clock to 14 cm depth from top wound to below bottom of lower wound.  Wounds have 1 cm undermining to edges.   Wound bed: 90% yellow slough covering wound bed, 10% red Drainage (amount, consistency, odor) Mod amt yellow drainage in cannister, no odor Periwound: Lower sacrum was previously covered by Vac sponge dressing and is red and macerated with patchy areas of partial thickness breakdown.  Appearance is consistent with moisture-associated skin damage.  Affected area is 2X2cm.  Foam dressing to protect and promote healing.  Left posterior calf has full thickness wound; 1X.5cm 100% eschar.  No odor or drainage. Right posterior leg has full thickness wound; 3X1cm and .5X1cm, both 100% eschar.  No odor or drainage. Dressing procedure/placement/frequency: Santyl ointment to chemically debride nonviable tissue to leg wounds.  Foam dressings to protect from further injury.  [employer] literature recommends Vac therapy should not be used on wounds with more than 20% of slough.  Discussed plan of care with Marissa Nestle, PA after assessment.  Wound has mod amt drainage and she feels that continuing Vac for suction to wound will be beneficial at this time until she can reach the surgeon from an outside hospital and pt can be transported for further debridement of nonviable tissue or further plan of care can be determined to post-op site. Bedside nurse can change dressings Q  Tues/Thurs/Sat.  Applied 2 pieces of black foam to cont suction.  Pt medicated for pain prior to procedure and tolerated with minimal discomfort.  Applied bridge to left flank and placed trackpad in this location to minimize pressure from tubing.  Please re-consult if further assistance is needed.  Thank-you,  Cammie Mcgee MSN, RN, CWOCN, [place], CNS [phone]

## 2014-04-22 NOTE — Evaluation (Signed)
Occupational Therapy Assessment and Plan  Patient Details  Name: Jon Lozano MRN: [id number] Date of Birth: 1941/02/08  OT Diagnosis: abnormal posture, acute pain, lumbago (low back pain), muscular wasting and disuse atrophy and muscle weakness (generalized) Rehab Potential: Rehab Potential: Good ELOS: 3-4 weeks   Today's Date: 04/22/2014 Time: 800-910 and 1300-1330 Time Calculation (min): 70 min and 30 min  Problem List:  Patient Active Problem List   Diagnosis Date Noted  . Physical deconditioning 04/21/2014    Past Medical History:  Past Medical History  Diagnosis Date  . Hypertension   . Heart murmur   . Peripheral vascular disease   . Arthritis   . Hepatitis 1954   Past Surgical History:  Past Surgical History  Procedure Laterality Date  . Cholecystectomy  1983  . Pericardial fluid drainage  2007  . Cataract extraction  2012  . 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 He was d/c to SNF from [facility] deloped purulent drainage on 03/10/14 with wound cultures positive for VRE. Hardware was removed by Dr. Tawanna Cooler and [facility] placed with recommendations for daptomycin as well as cefepime with recommendations for 8 weeks of antibiotic therapy. He was readmitted to [facility] on 03/22/14 for antibiotic as well as therapy. Sputum culture from [place] positive for Kleb-pneumonia and cefepime was changed to meropenum on 03/26/14. ID following for input with recommendations to continue antibiotics through 05/04/14. Was on dysphagia diet with honey liquids and has been  advanced to nectars at bedside on 04/09/14. 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 Back wound continues to have copious drainage with areas of dehiscence. Patient transferred to CIR on 04/21/2014 .    Patient currently requires total with basic self-care skills secondary to muscle weakness, decreased cardiorespiratoy endurance and decreased sitting balance and decreased postural control.  Prior to hospitalization in February 2015, patient was fairly independent with his self care.  Patient will benefit from skilled intervention to increase independence with basic self-care skills prior to discharge home with care partner.  Anticipate patient will require minimal physical assistance and follow up home health.  OT - End of Session Activity Tolerance: Tolerates < 10 min activity, no significant change in vital signs Endurance Deficit: Yes Endurance Deficit Description: DOE 3/4 with efforts to sit up OT Assessment Rehab Potential: Good Barriers to Discharge: Decreased caregiver support Barriers to Discharge Comments: Wife is physically limited to assist patient OT Patient demonstrates impairments in the following area(s): Balance;Endurance;Motor;Pain;Safety;Sensory OT Basic ADL's Functional Problem(s): Grooming;Bathing;Dressing;Toileting OT Transfers Functional Problem(s): Toilet;Tub/Shower OT Additional Impairment(s): None OT Plan OT Intensity: Minimum of 1-2 x/day, 45 to 90 minutes OT Frequency: 5 out of 7 days OT Duration/Estimated Length of Stay: 3-4 weeks OT Treatment/Interventions: Balance/vestibular training;Discharge planning;DME/adaptive equipment instruction;Functional mobility training;Pain management;Psychosocial support;Self Care/advanced ADL retraining;Patient/family education;Therapeutic Activities;Therapeutic Exercise;UE/LE Strength taining/ROM OT Self Feeding Anticipated Outcome(s): I OT Basic Self-Care Anticipated Outcome(s): supervision bathing, toileting, UB  dressing; min LB dressing OT Toileting Anticipated Outcome(s): supervision OT Bathroom Transfers Anticipated Outcome(s): supervision OT Recommendation Patient destination: Home Follow Up Recommendations: Home health OT Equipment Recommended: 3 in  1 bedside comode;Tub/shower bench   Skilled Therapeutic Intervention Visit 1:  No c/o pain at rest, pt in 10/10 pain when he tries to lift his head/upper neck off the bed or any large movements, including elevating the bed above 45 degrees. Pt seen for initial evaluation and ADL retraining of bathing and dressing. The purpose of OT explained to patient.  He needs significant assist due to severe weakness and pain.  Pt would not have his head lifted off pillow without pain or even want bed elevated above 45 degrees. Worked on rolling for LB dressing. Pt adjusted in bed at end of session with call light and breakfast tray set up.  Visit 2: Pain only with movement. Pt was soiled and needed a brief and clothing change. Bridged hips partially and rolled L and R with S. Pt unaware he was soiled.  Pt's wife present and reviewed expected LOS and goals.    OT Evaluation Precautions/Restrictions  Precautions Precautions: Fall Precaution Comments: wound VAC on back  Vital Signs Therapy Vitals Temp: 98.1 F (36.7 C) Temp src: Oral Pulse Rate: 102 Resp: 18 BP: 105/72 mmHg Patient Position (if appropriate): Lying Oxygen Therapy SpO2: 94 % Pain Pain Assessment Pain Assessment: 0-10 Pain Score: 5  Pain Type: Surgical pain Pain Location: Back Pain Descriptors / Indicators: Aching;Sore Pain Frequency: Constant Pain Onset: On-going Patients Stated Pain Goal: 3 Pain Intervention(s): Medication (See eMAR);Repositioned;Emotional support Multiple Pain Sites: No Home Living/Prior Functioning Home Living Available Help at Discharge: Other (Comment) (wife is physically limited in her ability to help pt. ) Type of Home: House Home Access: Level entry  (back door level entrance to apartment via golfcart per pt) Home Layout: Multi-level;Able to live on main level with bedroom/bathroom (plan is to live in basement apartment of 5 story home) Alternate Level Stairs-Number of Steps: flight of steps to basement apartment but level entrance from outside  Lives With: Spouse Prior Function Level of Independence: Independent with basic ADLs;Independent with transfers;Independent with gait (Prior to hospital admission in February 2015, short distances with [facility])  Able to Take Stairs?: No Driving: No Vocation: Retired ADL ADL ADL Comments: Refer to [employer] Vision/Perception  Vision- History Baseline Vision/History: Wears glasses Wears Glasses: Distance only Patient Visual Report: No change from baseline (Not a significant change, but pt feels his acuity is less) Vision- Assessment Vision Assessment?: No apparent visual deficits  Cognition Overall Cognitive Status: Within Functional Limits for tasks assessed Orientation Level: Oriented X4 Attention: Selective Sensation Sensation Light Touch: Impaired Detail Light Touch Impaired Details: Absent RLE;Absent LLE (great toes, partial foot bil) Stereognosis: Appears Intact Hot/Cold: Appears Intact Proprioception: Appears Intact (bil ankles) Coordination Gross Motor Movements are Fluid and Coordinated: No Fine Motor Movements are Fluid and Coordinated: No Coordination and Movement Description: Movements are slow and labored due to muscle weakness Heel Shin Test: NT Motor  Motor Motor: Motor impersistence Motor - Skilled Clinical Observations: generalized weakness Mobility  Bed Mobility Bed Mobility: Rolling Right;Rolling Left;Right Sidelying to Sit;Scooting to [facility] Rolling Right: With rail;5: Supervision Rolling Left: With rail;5: Supervision Right Sidelying to Sit: HOB elevated;With rails;Not tested (comment) (unable with total assist of 1 person; fearful) Scooting to [facility]: 3: Mod assist;With  rail Scooting to [facility] Details (indicate cue type and reason): flat bed  Trunk/Postural Assessment  Cervical Assessment Cervical Assessment: Within Functional Limits Thoracic Assessment Thoracic Assessment:  (NT) Lumbar Assessment Lumbar Assessment:  (NT due to pain) Postural Control Postural Control:  (NT due to pain)  Balance Balance Balance  Assessed:  (Pt in too much pain. Pt stated that he needed +2 assist last time he sat EOB) Extremity/Trunk Assessment RUE Assessment RUE Assessment: Exceptions to [facility] RUE AROM (degrees) RUE Overall AROM Comments: WFL RUE Strength RUE Overall Strength Comments: 4-/5 LUE Assessment LUE Assessment: Exceptions to WFL LUE AROM (degrees) LUE Overall AROM Comments: WFL LUE Strength LUE Overall Strength Comments: 4-/5  FIM: Refer to FIM  FIM - Bed/Chair Transfer Bed/Chair Transfer: 0: Activity did not occur   Refer to Care Plan for Long Term Goals  Recommendations for other services: None  Discharge Criteria: Patient will be discharged from OT if patient refuses treatment 3 consecutive times without medical reason, if treatment goals not met, if there is a change in medical status, if patient makes no progress towards goals or if patient is discharged from hospital.  The above assessment, treatment plan, treatment alternatives and goals were discussed and mutually agreed upon: by patient and by family  [place] 04/22/2014, 3:47 PM

## 2014-04-22 NOTE — Evaluation (Signed)
Speech Language Pathology Assessment and Plan  Patient Details  Name: Jon Lozano MRN: [id number] Date of Birth: June 16, 1941  SLP Diagnosis: Dysphagia  Rehab Potential: Good ELOS: 3-4 weeks   Today's Date: 04/22/2014 Time: 5400-8676 Time Calculation (min): 55 min  Problem List:  Patient Active Problem List   Diagnosis Date Noted  . Physical deconditioning 04/21/2014   Past Medical History:  Past Medical History  Diagnosis Date  . Hypertension   . Heart murmur   . Peripheral vascular disease   . Arthritis   . Hepatitis 1954   Past Surgical History:  Past Surgical History  Procedure Laterality Date  . Cholecystectomy  1983  . Pericardial fluid drainage  2007  . Cataract extraction  2012  . Tee without cardioversion  10/23/2012    Procedure: TRANSESOPHAGEAL ECHOCARDIOGRAM (TEE);  Surgeon: Sanda Klein, MD;  Location: [place];  Service: Cardiovascular;  Laterality: N/A;  to come in1130 for work up    Assessment / Plan / Recommendation Clinical Impression Mr. 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 He was discharged to SNF from [facility] developed purulent drainage on 03/10/14 with wound cultures positive for VRE. Hardware was removed by Dr. Tawanna Cooler and [facility] placed with recommendations for daptomycin as well as cefepime with recommendations for 8 weeks of antibiotic therapy. He was readmitted to [facility] on 03/22/14 for antibiotic as well as therapy. Sputum culture from [place] positive for Kleb-pneumonia and cefepime was changed to meropenum on 03/26/14. ID following for input with recommendations to continue antibiotics through 05/04/14. Was on a modified diet with honey-thick liquids and has been advanced to dysphagia 3/soft solids with nectar-thick liquids at bedside on 04/09/14. 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 Back wound continues to have copious drainage with areas of dehiscence. Oxycodone scheduled bid to help with pain control. Foley was discontinued last week with incontinence reported. PO intake and endurance improving. Therapy ongoing and CIR recommended by rehab team. Patient admitted 04/21/14 for progressive therapies.  Orders received and Bedside Swallow evaluation completed; Cognitive-Linguistic Evaluation initiated.  Patient demonstrates impaired mastication of solid textures over that course of a meal due to fatigue; as well as mild pharyngeal impairments.  Recommend to continue Dys 3 textures and nectar-thick liquids with intermittent supervision.  Patient able to return demonstration of pharyngeal strengthening exercises.  Patient demonstrated decreased ability to retrieve both old and new information as well as words intermittently during conversation.  SLP questions cause of symptoms hospital delirium versus cognitive changes.  Patient would benefit from skilled SLP services to address least restrictive PO intake as well as further diagnostic treatment of cognitive-linguistic changes to determine if these factors impact performance and progress in therapies to maximize functional independence prior to discharge home with 24/7 assist.   Skilled Therapeutic Interventions          Bedside Swallow evaluation completed; Cognitive-linguistic evaluation initiated with results and recommendations reviewed with patient and family.    SLP Assessment  Patient will need skilled Speech Lanaguage Pathology Services during CIR admission    Recommendations  Diet Recommendations: Dysphagia 3 (Mechanical Soft);Nectar-thick liquid Liquid Administration via: Straw;Cup Medication Administration: Whole meds with puree Supervision: Patient able to self feed;Intermittent supervision to cue for compensatory strategies Compensations: Slow rate;Small sips/bites;Clear throat intermittently Postural  Changes and/or Swallow Maneuvers: Seated upright 90 degrees Oral Care  Recommendations: Oral care BID Patient destination: Home Follow up Recommendations: Other (comment);24 hour supervision/assistance (TBD) Equipment Recommended: None recommended by SLP    SLP Frequency 5 out of 7 days   SLP Treatment/Interventions Dysphagia/aspiration precaution training;Environmental controls;Functional tasks;Internal/external aids;Patient/family education;Other (comment) (pharyngeal strengthening exercises)    Pain Pain Assessment Pain Assessment: 0-10 Pain Score: 5  Pain Type: Surgical pain Pain Location: Back Pain Descriptors / Indicators: Aching;Sore Pain Frequency: Constant Pain Onset: On-going Patients Stated Pain Goal: 3 Pain Intervention(s): Medication (See eMAR);Repositioned;Emotional support Multiple Pain Sites: No Prior Functioning Cognitive/Linguistic Baseline: Within functional limits Type of Home: House  Lives With: Spouse Available Help at Discharge: Available 24 hours/day Vocation: Retired  Industrial/product designer Term Goals: Week 1: SLP Short Term Goal 1 (Week 1): Patient will recall procedures for Free Water Protocol with Min question cues SLP Short Term Goal 2 (Week 1): Patient will recall and accurately perform pharyngeal strengthening exercises with Supervision  SLP Short Term Goal 3 (Week 1): Patient will participate in on going diagnostic treatment for memory and word retreival  SLP Short Term Goal 4 (Week 1): Patient will demonstrate effectient mastication of regular textures   See FIM for current functional status Refer to Care Plan for Long Term Goals  Recommendations for other services: None  Discharge Criteria: Patient will be discharged from SLP if patient refuses treatment 3 consecutive times without medical reason, if treatment goals not met, if there is a change in medical status, if patient makes no progress towards goals or if patient is discharged from hospital.  The above  assessment, treatment plan, treatment alternatives and goals were discussed and mutually agreed upon: by patient and by family  Carmelia Roller., CCC-SLP [phone]  [place] 04/22/2014, 5:19 PM

## 2014-04-22 NOTE — Progress Notes (Signed)
Have left message with Dr. Mittie Bodo office regarding need to discuss patient's wound.

## 2014-04-22 NOTE — Progress Notes (Addendum)
INITIAL NUTRITION ASSESSMENT  DOCUMENTATION CODES Per approved criteria  -Not Applicable   INTERVENTION: Continue Pro-stat TID Provide Magic Cup BID Encourage PO intake  NUTRITION DIAGNOSIS: Inadequate oral intake related to poor appetite as evidenced by 6.4% weight loss in less than 4 months.   Goal: Pt to meet >/= 90% of their estimated nutrition needs   Monitor:  PO intake, supplement acceptance, weight trend, wounds, labs  Reason for Assessment: Malnutrition Screening Tool, score of 3  73 y.o. 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 ID following for input with recommendations to continue antibiotics through 05/04/14. Was on dysphagia diet with honey liquids and has been advanced to nectars at bedside on 04/09/14. 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 Back wound continues to have copious drainage with areas of dehiscence. Po intake and endurance improving. Therapy ongoing and CIR recommended by rehab team. Patient admitted for progressive therapies.   Pt has been ordered Pro-stat TID, vitamin C and zinc supplements to promote wound healing.  Pt reports that he was weighing 210 lbs 3 months prior to his surgery but, he wanted to lose weight so he went on a diet and lost down to 190 lbs. He states that since his surgery he has had a poor appetite and has lost an additional 12 lbs unintentionally. Per nursing notes pt ate 0% of dinner last night; pt states he was not hungry. Pt reports that he is a picky eater and he describes his appetite as poor to fair but, feels it has been improving. Encouraged PO intake and discussed food options while admitted. Pt willing to continue current supplements ordered and to try Magic Cup.  Per pt's report he has had 6.4%  weight loss within the past 4 months and 15.3% weight loss within the past 7 months.  Nutrition Focused Physical Exam:  Subcutaneous Fat:  Orbital Region: wnl Upper Arm Region: mild wasting Thoracic and Lumbar Region: NA  Muscle:  Temple Region: mild wasting Clavicle Bone Region: moderate wasting Clavicle and Acromion Bone Region: moderate wasting Scapular Bone Region: NA Dorsal Hand: mild wasting Patellar Region: moderate/severe wasting Anterior Thigh Region: moderate wasting Posterior Calf Region: severe wasting  Edema: none noted   Height: Ht Readings from Last 1 Encounters:  04/21/14 5\' 8"  (1.727 m)    Weight: Wt Readings from Last 1 Encounters:  04/22/14 178 lb (80.74 kg)    Ideal Body Weight: 154 lbs  % Ideal Body Weight: 116%  Wt Readings from Last 10 Encounters:  04/22/14 178 lb (80.74 kg)  04/21/14 180 lb (81.647 kg)  10/23/12 212 lb (96.163 kg)  10/23/12 212 lb (96.163 kg)    Usual Body Weight: 210 lbs  % Usual Body Weight: 85%  BMI:  Body mass index is 27.07 kg/(m^2).  Estimated Nutritional Needs: Kcal: 2000-2200 Protein: 95-110 grams Fluid: 2.2 L/day  Skin: unstageable pressure ulcer on right sacrum, open incision on back  Diet Order: Dysphagia 3, nectar-thick liquids  EDUCATION NEEDS: -No education needs identified at this time   Intake/Output Summary (Last 24 hours) at 04/22/14 1143 Last data filed at 04/21/14 1914  Gross per 24 hour  Intake      0 ml  Output      0 ml  Net      0 ml  Last BM: 6/10  Labs:   Recent Labs Lab 04/16/14 0746 04/20/14 0535 04/22/14 0610  NA 139 142 141  K 3.4* 4.2 3.5*  CL 96 99 97  CO2 32 33* 29  BUN 18 18 20   CREATININE 0.81 0.76 0.68  CALCIUM 8.9 9.5 9.1  GLUCOSE 102* 83 86    CBG (last 3)   Recent Labs  04/21/14 2115 04/22/14 0712 04/22/14 1116  GLUCAP 185* 81 156*    Scheduled Meds: . aspirin EC  81 mg Oral Daily  . calcium-vitamin D  1 tablet Oral BID  . clopidogrel   75 mg Oral Q breakfast  . collagenase   Topical Daily  . DAPTOmycin (CUBICIN)  IV  500 mg Intravenous Q24H  . enoxaparin (LOVENOX) injection  40 mg Subcutaneous Q24H  . feeding supplement (PRO-STAT SUGAR FREE 64)  30 mL Oral TID WC  . folic acid  1 mg Oral Daily  . furosemide  20 mg Oral BID  . guaiFENesin  1,200 mg Oral BID  . insulin aspart  0-5 Units Subcutaneous QHS  . insulin aspart  0-9 Units Subcutaneous TID WC  . iron polysaccharides  150 mg Oral TID  . lidocaine  1 patch Transdermal Q24H  . meropenem (MERREM) IV  1 g Intravenous 3 times per day  . mirtazapine  15 mg Oral QHS  . multivitamin with minerals  1 tablet Oral Daily  . oxyCODONE  5 mg Oral BID  . pantoprazole  40 mg Oral Daily  . polyethylene glycol  17 g Oral BID  . potassium chloride  20 mEq Oral BID  . predniSONE  10 mg Oral Q breakfast  . pregabalin  100 mg Oral TID  . saccharomyces boulardii  250 mg Oral BID  . tamsulosin  0.4 mg Oral QPC supper  . vitamin C  250 mg Oral TID  . zinc sulfate  220 mg Oral Daily    Continuous Infusions:   Past Medical History  Diagnosis Date  . Hypertension   . Heart murmur   . Peripheral vascular disease   . Arthritis   . Hepatitis 1954    Past Surgical History  Procedure Laterality Date  . Cholecystectomy  1983  . Pericardial fluid drainage  2007  . Cataract extraction  2012  . Tee without cardioversion  10/23/2012    Procedure: TRANSESOPHAGEAL ECHOCARDIOGRAM (TEE);  Surgeon: 14/10/2012, MD;  Location: [facility] ENDOSCOPY;  Service: Cardiovascular;  Laterality: N/A;  to come [facility] for work up    [id number] RD, LDN Inpatient Clinical Dietitian Pager: [phone] After Hours Pager: [phone]

## 2014-04-22 NOTE — Evaluation (Addendum)
Physical Therapy Assessment and Plan  Patient Details  Name: Jon Lozano MRN: [id number] Date of Birth: December 11, 1940  PT Diagnosis: Difficulty walking, Impaired sensation, Low back pain and Muscle weakness Rehab Potential: Fair ELOS: 24-27 days   Today's Date: 04/22/2014 Time: 2542-7062 Time Calculation (min): 50 min  Problem List:  Patient Active Problem List   Diagnosis Date Noted  . Physical deconditioning 04/21/2014    Past Medical History:  Past Medical History  Diagnosis Date  . Hypertension   . Heart murmur   . Peripheral vascular disease   . Arthritis   . Hepatitis 1954   Past Surgical History:  Past Surgical History  Procedure Laterality Date  . Cholecystectomy  1983  . Pericardial fluid drainage  2007  . Cataract extraction  2012  . 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 He was d/c to SNF from [facility] deloped purulent drainage on 03/10/14 with wound cultures positive for VRE. Hardware was removed by Dr. Tawanna Cooler and [facility] placed with recommendations for daptomycin as well as cefepime with recommendations for 8 weeks of antibiotic therapy. He was readmitted to [facility] on 03/22/14 for antibiotic as well as therapy. Sputum culture from [place] positive for Kleb-pneumonia and cefepime was changed to meropenum on 03/26/14. ID following for input with recommendations to continue antibiotics through 05/04/14. Was on dysphagia diet with honey liquids and has been advanced to nectars at bedside on 04/09/14. 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 Back wound continues to have copious drainage with areas of dehiscence. Oxycodone scheduled bid to help with pain control. Foley was discontinued last week with incontinence reported. Patient transferred to CIR on 04/21/2014 .   Patient currently requires total assist with mobility secondary to muscle weakness and muscle joint tightness and decreased cardiorespiratoy endurance.  Prior to hospitalization, patient was modified independent  with mobility and lived with Spouse in a House home.  Home access is  Level entry (back door level entrance to apartment via golfcart per pt).  Patient will benefit from skilled PT intervention to maximize safe functional mobility, minimize fall risk and decrease caregiver burden for planned discharge home with 24 hour assist.  Anticipate patient will benefit from follow up [facility] at discharge.  PT - End of Session Activity Tolerance: Tolerates < 10 min activity, no significant change in vital signs Endurance Deficit: Yes Endurance Deficit Description: DOE 3/4 with efforts to sit up PT Assessment Rehab Potential: Fair Barriers to Discharge: Decreased caregiver support PT Patient demonstrates impairments in the following area(s): Balance;Endurance;Motor;Pain;Safety;Sensory;Skin Integrity PT Transfers Functional Problem(s): Bed Mobility;Bed to Chair;Car;Furniture PT Locomotion Functional Problem(s): Wheelchair Mobility;Stairs;Ambulation PT Plan PT Intensity: Minimum of 1-2 x/day ,45 to 90 minutes PT Frequency: 5 out of 7 days PT Duration Estimated Length of Stay: 24-27 days PT Treatment/Interventions: Ambulation/gait training;Balance/vestibular training;Discharge planning;Community reintegration;DME/adaptive equipment instruction;Functional electrical stimulation;Functional mobility training;Patient/family education;Pain management;Neuromuscular re-education;Psychosocial support;Splinting/orthotics;Therapeutic Exercise;Therapeutic Activities;UE/LE Strength  taining/ROM;UE/LE Coordination activities;Wheelchair propulsion/positioning PT Transfers Anticipated Outcome(s): supervision; min assist car PT Locomotion Anticipated Outcome(s): w/c modified independent x 150'; gait TBD PT Recommendation Follow Up Recommendations: Home health PT  Patient destination: Home Equipment Recommended: To be determined Equipment Details: pt owns [facility] and walker , ? wheeled  Skilled Therapeutic Intervention- pt attempted to use urinal by positioning in semi-upright position in bed, but was not able to void. Pt missed 10 minutes tx due to pain after previous therapy. Pt's pain increased from 3/10-6/10 in low back with efforts of bed mobility.  RN premedicated pt during session, prior to wound vac treatment by wound RN.    PT Evaluation Precautions/Restrictions Precautions Precautions: Fall General Amount of Missed PT Time (min): 10 Minutes Missed Time Reason: Pain;Patient fatigue Vital SignsTherapy Vitals Temp: 98.1 F (36.7 C) Temp src: Oral Pulse Rate: 102 Resp: 18 BP: 105/72 mmHg Patient Position (if appropriate): Lying Oxygen Therapy SpO2: 94 % Pain3/10 at start of session; 6/10 with bed mobility; RN aware; premedicated  Pain Assessment Pain Assessment: 0-10 Pain Score: 5  Pain Type: Surgical pain Pain Location: Back Pain Descriptors / Indicators: Aching;Sore Pain Frequency: Constant Pain Onset: On-going Patients Stated Pain Goal: 3 Pain Intervention(s): Medication (See eMAR);Repositioned;Emotional support Multiple Pain Sites: No Home Living/Prior Functioning Home Living Available Help at Discharge: Other (Comment) (wife is physically limited in her ability to help pt. ) Type of Home: House Home Access: Level entry (back door level entrance to apartment via golfcart per pt) Home Layout: Multi-level;Able to live on main level with bedroom/bathroom (plan is to live in basement apartment of 5 story home) Alternate Level Stairs-Number of Steps:  flight of steps to basement apartment but level entrance from outside  Lives With: Spouse Prior Function Level of Independence: Independent with gait (short distances, SPC or walker)  Able to Take Stairs?: No Driving: No Vocation: Retired Vision/Perception- ? Changes per pt     Cognition Overall Cognitive Status: Within Functional Limits for tasks assessed Orientation Level: Oriented X4 Attention: Selective Sensation Sensation Light Touch: Impaired Detail Light Touch Impaired Details: Absent RLE;Absent LLE (great toes, partial foot bil) Proprioception: Appears Intact (bil ankles) Coordination Heel Shin Test: NT Motor  Motor Motor: Motor impersistence Motor - Skilled Clinical Observations: generalized weakness  Mobility Bed Mobility Bed Mobility: Rolling Right;Rolling Left;Right Sidelying to Sit;Scooting to [facility] Rolling Right: With rail;5: Supervision Rolling Left: With rail;5: Supervision Right Sidelying to Sit: HOB elevated;With rails;Not tested (comment) (unable with total assist of 1 person; fearful) Scooting to [facility]: 3: Mod assist;With rail Scooting to [facility] Details (indicate cue type and reason): flat bed Transfers Transfers: No (pt too painful upon attempting supine> sit) Locomotion  Ambulation Ambulation: No Gait Gait: No Stairs / Additional Locomotion Stairs: No Wheelchair Mobility Wheelchair Mobility: No  Trunk/Postural Assessment  Cervical Assessment Cervical Assessment: Within Functional Limits Thoracic Assessment Thoracic Assessment:  (NT) Lumbar Assessment Lumbar Assessment:  (NT due to pain) Postural Control Postural Control:  (NT due to pain)  Balance Balance Balance Assessed: No Extremity Assessment      RLE Assessment RLE Assessment: Exceptions to [facility] (valgus deformity toes, heel cord tight) RLE Strength RLE Overall Strength Comments: grossly in supine, head elevated: 3+/ 5 hip flex, abd; 4-/5 hip add and knee ext/flex,  ankle DF3-/5 LLE  Assessment LLE Assessment: Exceptions to [facility] (valgus deformity toes; tight heel cord) LLE Strength LLE Overall Strength Comments: grossly in supine, head elevated: hip flex/add/abd 4-/5, knee flex/ext 4-/5, aml;e DF 3-/5 but able to take some resistamce  FIM:  FIM - Bed/Chair Transfer Bed/Chair Transfer: 0: Activity did not occur FIM - Locomotion: Wheelchair Locomotion: Wheelchair: 0: Activity did not occur FIM - Locomotion: Ambulation Locomotion:  Ambulation: 0: Activity did not occur FIM - Locomotion: Stairs Locomotion: Stairs: 0: Activity did not occur   Refer to Care Plan for Long Term Goals  Recommendations for other services: None  Discharge Criteria: Patient will be discharged from PT if patient refuses treatment 3 consecutive times without medical reason, if treatment goals not met, if there is a change in medical status, if patient makes no progress towards goals or if patient is discharged from hospital.  The above assessment, treatment plan, treatment alternatives and goals were discussed and mutually agreed upon: by patient  Terriah Reggio 04/22/2014, 3:12 PM

## 2014-04-23 ENCOUNTER — Inpatient Hospital Stay (HOSPITAL_COMMUNITY): Payer: Medicare Other | Admitting: *Deleted

## 2014-04-23 ENCOUNTER — Inpatient Hospital Stay (HOSPITAL_COMMUNITY): Payer: Medicare Other | Admitting: Occupational Therapy

## 2014-04-23 ENCOUNTER — Inpatient Hospital Stay (HOSPITAL_COMMUNITY): Payer: BLUE CROSS/BLUE SHIELD | Admitting: Speech Pathology

## 2014-04-23 LAB — GLUCOSE, CAPILLARY
GLUCOSE-CAPILLARY: 82 mg/dL (ref 70–99)
Glucose-Capillary: 107 mg/dL — ABNORMAL HIGH (ref 70–99)
Glucose-Capillary: 118 mg/dL — ABNORMAL HIGH (ref 70–99)
Glucose-Capillary: 94 mg/dL (ref 70–99)

## 2014-04-23 MED ORDER — COLLAGENASE 250 UNIT/GM EX OINT
TOPICAL_OINTMENT | Freq: Every day | CUTANEOUS | Status: DC
Start: 2014-04-23 — End: 2014-04-23
  Administered 2014-04-23: 10:00:00 via TOPICAL

## 2014-04-23 NOTE — Progress Notes (Signed)
VRE lumbar infection and MRSA bacteremia.  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 He was d/c to SNF from [facility] deloped purulent drainage on 03/10/14 with wound cultures positive for VRE. Hardware was removed by Dr. Tawanna Cooler and [facility] placed with recommendations for daptomycin as well as cefepime with recommendations for 8 weeks of antibiotic therapy. He was readmitted to [facility] on 03/22/14 for antibiotic as well as therapy. Sputum culture from [place] positive for Kleb-pneumonia and cefepime was changed to meropenum on 03/26/14. ID following for input with recommendations to continue antibiotics through 05/04/14. Was on dysphagia diet with honey liquids and has been advanced to nectars at bedside on 04/09/14. 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 Back wound continues to have copious drainage with areas of dehiscence  Subjective/Complaints: Contacted orthopedist  Dr Tawanna Cooler who relayed hx of multiple debridements and cardiac decompensation during one debridement, was ok with W>D trial x 1 wk and then resume Wound vac if excess drainage    Review of Systems - Negative except intermittent pain, mostly back but also "general aches and pain" no shooting pain into legs Objective: Vital Signs: Blood pressure 115/79, pulse 85, temperature 97.6 F (36.4 C), temperature source Oral, resp. rate 18, height $RemoveBe'5\' 8"'DmNssIvRl$  (1.727 m), weight 80.559 kg (177 lb 9.6 oz), SpO2 100.00%. Dg Chest 1 View  04/22/2014   CLINICAL DATA:  PICC line placement  EXAM: CHEST - 1 VIEW  COMPARISON:  Portable exam 1017 hr compared to 03/22/2014  FINDINGS: RIGHT arm PICC line tip projects over SVC.  Minimal enlargement of cardiac silhouette.  Mediastinal contours and pulmonary vascularity normal.  Mild persistent LEFT basilar infiltrate.  Improving aeration at  RIGHT base.  No pleural effusion or pneumothorax.  IMPRESSION: Tip of RIGHT arm PICC line projects over mid SVC.  Persistent LEFT basilar and improving RIGHT basilar pulmonary infiltrates.   Electronically Signed   By: Lavonia Dana M.D.   On: 04/22/2014 17:14   Results for orders placed during the hospital encounter of 04/21/14 (from the past 72 hour(s))  GLUCOSE, CAPILLARY     Status: Abnormal   Collection Time    04/21/14  9:15 PM      Result Value Ref Range   Glucose-Capillary 185 (*) 70 - 99 mg/dL  CBC WITH DIFFERENTIAL     Status: Abnormal   Collection Time    04/22/14  6:10 AM      Result Value Ref Range   WBC 5.3  4.0 - 10.5 K/uL   RBC 3.40 (*) 4.22 - 5.81 MIL/uL   Hemoglobin 9.2 (*) 13.0 - 17.0 g/dL   HCT 29.3 (*) 39.0 - 52.0 %   MCV 86.2  78.0 - 100.0 fL   MCH 27.1  26.0 - 34.0 pg   MCHC 31.4  30.0 - 36.0 g/dL   RDW 16.6 (*) 11.5 - 15.5 %   Platelets 171  150 - 400 K/uL   Neutrophils Relative % 72  43 - 77 %   Neutro Abs 3.9  1.7 - 7.7 K/uL   Lymphocytes Relative 7 (*) 12 - 46 %   Lymphs Abs 0.4 (*) 0.7 - 4.0 K/uL   Monocytes Relative 17 (*) 3 - 12 %   Monocytes Absolute 0.9  0.1 - 1.0 K/uL   Eosinophils Relative 3  0 - 5 %  Eosinophils Absolute 0.2  0.0 - 0.7 K/uL   Basophils Relative 1  0 - 1 %   Basophils Absolute 0.0  0.0 - 0.1 K/uL  COMPREHENSIVE METABOLIC PANEL     Status: Abnormal   Collection Time    04/22/14  6:10 AM      Result Value Ref Range   Sodium 141  137 - 147 mEq/L   Potassium 3.5 (*) 3.7 - 5.3 mEq/L   Chloride 97  96 - 112 mEq/L   CO2 29  19 - 32 mEq/L   Glucose, Bld 86  70 - 99 mg/dL   BUN 20  6 - 23 mg/dL   Creatinine, Ser 0.68  0.50 - 1.35 mg/dL   Calcium 9.1  8.4 - 10.5 mg/dL   Total Protein 6.6  6.0 - 8.3 g/dL   Albumin 2.7 (*) 3.5 - 5.2 g/dL   AST 25  0 - 37 U/L   ALT 14  0 - 53 U/L   Alkaline Phosphatase 148 (*) 39 - 117 U/L   Total Bilirubin 0.8  0.3 - 1.2 mg/dL   GFR calc non Af Amer >90  >90 mL/min   GFR calc Af Amer >90  >90  mL/min   Comment: (NOTE)     The eGFR has been calculated using the CKD EPI equation.     This calculation has not been validated in all clinical situations.     eGFR's persistently <90 mL/min signify possible Chronic Kidney     Disease.  GLUCOSE, CAPILLARY     Status: None   Collection Time    04/22/14  7:12 AM      Result Value Ref Range   Glucose-Capillary 81  70 - 99 mg/dL   Comment 1 Notify RN    MRSA PCR SCREENING     Status: None   Collection Time    04/22/14  9:44 AM      Result Value Ref Range   MRSA by PCR NEGATIVE  NEGATIVE   Comment:            The GeneXpert MRSA Assay (FDA     approved for NASAL specimens     only), is one component of a     comprehensive MRSA colonization     surveillance program. It is not     intended to diagnose MRSA     infection nor to guide or     monitor treatment for     MRSA infections.  GLUCOSE, CAPILLARY     Status: Abnormal   Collection Time    04/22/14 11:16 AM      Result Value Ref Range   Glucose-Capillary 156 (*) 70 - 99 mg/dL   Comment 1 Notify RN    GLUCOSE, CAPILLARY     Status: Abnormal   Collection Time    04/22/14  4:31 PM      Result Value Ref Range   Glucose-Capillary 145 (*) 70 - 99 mg/dL  GLUCOSE, CAPILLARY     Status: Abnormal   Collection Time    04/22/14  9:06 PM      Result Value Ref Range   Glucose-Capillary 165 (*) 70 - 99 mg/dL  GLUCOSE, CAPILLARY     Status: None   Collection Time    04/23/14  7:48 AM      Result Value Ref Range   Glucose-Capillary 82  70 - 99 mg/dL     HEENT: normal Cardio: RRR and no murmur Resp: CTA B/L  and unlabored GI: BS positive and NT,ND Extremity:  Pulses positive and No Edema Skin:   Breakdown skin tears on shins, no heel breakdown Neuro: Lethargic, Abnormal Sensory reduced sensory Left L5 and S1 dermatomes and Abnormal Motor 4/5 BUE except 3- at delt Musc/Skel:  LB tender GEN NAD   Assessment/Plan: 1. Functional deficits secondary to deconditioning after wound  infection post lumbar fusion which require 3+ hours per day of interdisciplinary therapy in a comprehensive inpatient rehab setting. Physiatrist is providing close team supervision and 24 hour management of active medical problems listed below. Physiatrist and rehab team continue to assess barriers to discharge/monitor patient progress toward functional and medical goals. FIM: FIM - Bathing Bathing Steps Patient Completed: Chest;Right Arm;Left Arm;Abdomen Bathing: 2: Max-Patient completes 3-4 32f 10 parts or 25-49%  FIM - Upper Body Dressing/Undressing Upper body dressing/undressing steps patient completed: Put head through opening of pull over shirt/dress;Thread/unthread left sleeve of pullover shirt/dress Upper body dressing/undressing: 3: Mod-Patient completed 50-74% of tasks FIM - Lower Body Dressing/Undressing Lower body dressing/undressing: 1: Total-Patient completed less than 25% of tasks  FIM - Toileting Toileting: 0: Activity did not occur  FIM - Air cabin crew Transfers: 0-Activity did not occur  FIM - IT sales professional Transfer: 0: Activity did not occur  FIM - Locomotion: Wheelchair Locomotion: Wheelchair: 0: Activity did not occur FIM - Locomotion: Ambulation Locomotion: Ambulation: 0: Activity did not occur  Comprehension Comprehension Mode: Auditory Comprehension: 6-Follows complex conversation/direction: With extra time/assistive device  Expression Expression Mode: Verbal Expression: 5-Expresses complex 90% of the time/cues < 10% of the time  Social Interaction Social Interaction: 6-Interacts appropriately with others with medication or extra time (anti-anxiety, antidepressant).  Problem Solving Problem Solving: 5-Solves complex 90% of the time/cues < 10% of the time  Memory Memory: 4-Recognizes or recalls 75 - 89% of the time/requires cueing 10 - 24% of the time  Medical Problem List and Plan:  1. Functional deficits secondary to  deconditioning from multiple medical issues--back surgery, removal of Infected hardware as well as bacteremia  2. DVT Prophylaxis/Anticoagulation: add Pharmaceutical: Lovenox. Check duplex as high risk for DVT with multiple surgeries and immobility.  3. Pain Management: Schedule oxycodone prior to therapy as well as prn doses in between. Was using dilaudid for severe pain while on Select.  4. Mood: Will have LCSW follow for evaluation and support.  5. Neuropsych: This patient is capable of making decisions on his own behalf.  6. Stress induced hyperglycemia: continue to check cbgs and use SSI for elevated BS.  7. Back wound: Wet to dry dressings. Continue air mattress overlay  8. Malnutrition: continue with nutritional supplements tid.  9. RA: on chronic steroids.  10. VRE/MRSA bacteremia with Kleb PNA: Continue daptomycin and cubicin thorough 05/04/14.  11. Anemia of chronic illness: continue iron supplement. 6/11 Hgb 9.2   LOS (Days) 2 A FACE TO FACE EVALUATION WAS PERFORMED  Jaimon Bugaj E 04/23/2014, 9:22 AM

## 2014-04-23 NOTE — Consult Note (Addendum)
WOC follow-up: Jon Lozano, rehab PA, discussed plan of care with neurosurgeon who performed the patient's previous back surgery at another facility.  Refer to progress notes for further information.  Vac has been discontinued and Santyl is ordered for chemical debridement of nonviable tissue.  Bedside nurses will perform daily dressing changes with Santyl and moist gauze packing.  WOC team will follow along as requested by primary team for re-assessment next week. Cammie Mcgee MSN, RN, CWOCN, [place], CNS [phone]

## 2014-04-23 NOTE — Progress Notes (Signed)
Discussed issues with patient's wound as well as WOC recommendations due to 90% slough in wound bed. Dr. Loralie Champagne would like to continue wound VAC to help promote healing and recommended mepital or white sponge. Relayed contraindications of VAC use on wounds with >20% slough as well as tunneling of wound and questioned debridement to help promote wound healing. Dr. Loralie Champagne reported that he has no plans to take patient back to OR--patient has had I and D X 3 and last complicated by cardiac arrest and VDRF. MRSA bacteremia as well as VRE have complicated wound healing additionally. He has dicussed with patient and family that slow and poor wound healing was anticipated due to chronic steroids.    I discussed recommendations for wet to dry with enzymatic debridement per WOC and he was agreeable to this for a period of time before resuming VAC and packing the whole wound including tunneled area. Will update him on wound towards end of next week. Patient and wife updated on current plan.

## 2014-04-23 NOTE — Plan of Care (Signed)
Problem: RH SKIN INTEGRITY Goal: RH STG SKIN FREE OF INFECTION/BREAKDOWN Skin free of infection/breakdown. Mod A  Outcome: Not Progressing Pt has multiple ulcers on Legs and sacrum

## 2014-04-23 NOTE — Progress Notes (Signed)
Inpatient Rehabilitation Center Individual Statement of Services  Patient Name:  Jon Lozano  Date:  04/23/2014  Welcome to the Inpatient Rehabilitation Center.  Our goal is to provide you with an individualized program based on your diagnosis and situation, designed to meet your specific needs.  With this comprehensive rehabilitation program, you will be expected to participate in at least 3 hours of rehabilitation therapies Monday-Friday, with modified therapy programming on the weekends.  Your rehabilitation program will include the following services:  Physical Therapy (PT), Occupational Therapy (OT), Speech Therapy (ST), 24 hour per day rehabilitation nursing, Therapeutic Recreation (TR), Neuropsychology, Case Management (Social Worker), Rehabilitation Medicine, Nutrition Services and Pharmacy Services  Weekly team conferences will be held on Wednesdays to discuss your progress.  Your Social Worker will talk with you frequently to get your input and to update you on team discussions.  Team conferences with you and your family in attendance may also be held.  Expected length of stay: 3 to 4 weeks  Overall anticipated outcome: Supervision with some minimal assistance  Depending on your progress and recovery, your program may change. Your Social Worker will coordinate services and will keep you informed of any changes. Your Social Worker's name and contact numbers are listed  below.  The following services may also be recommended but are not provided by the Inpatient Rehabilitation Center:   Driving Evaluations  Home Health Rehabiltiation Services  Outpatient Rehabilitation Services   Arrangements will be made to provide these services after discharge if needed.  Arrangements include referral to agencies that provide these services.  Your insurance has been verified to be:  Medicare and BCBS Your primary doctor is:  Orion Crook, [place]  Pertinent information will be shared with your  doctor and your insurance company.  Social Worker:  Staci Acosta, LCSW  [phone] or ([phone]  Information discussed with and copy given to patient by: Elvera Lennox, 04/23/2014, 9:54 AM

## 2014-04-23 NOTE — Progress Notes (Signed)
Occupational Therapy Session Note  Patient Details  Name: Jon Lozano MRN: [id number] Date of Birth: July 27, 1941  Today's Date: 04/23/2014 Time: 1040-1140 Time Calculation (min): 60 min  Short Term Goals: Week 1:  OT Short Term Goal 1 (Week 1): Pt will tolerate sitting EOB for 10 minutes to engage in UB bathing and dressing with steady A. OT Short Term Goal 2 (Week 1): Pt will don a shirt with min A. OT Short Term Goal 3 (Week 1): Pt will transfer to a BSC or toilet with mod-max A x1.  OT Short Term Goal 4 (Week 1): Pt will be able to cleanse himself on toilet or BSC with mod A.   Skilled Therapeutic Interventions/Progress Updates:    Pt seen this session for BADL retraining of grooming, bathing, dressing with a focus on pt tolerating flexing hips past 90 degrees in leaning forward. At start of session, pt in recliner chair reclined back. Pt brought to sink to brush his teeth with back of chair raised to upright position at 90 degrees. Even with total A pt could not tolerate leaning forward to spit toothpaste out in sink. Pt did bathe UB, but again needed total A to move back forward slightly to wash his back and pull shirt down his back.  Pt needed to have brief changed. He stated that the transfer with the stedy was very difficult for him to lift his hips.  Used [place] Dec Plus to elevate hips into stand for brief change. Pt tolerated this well with very minimal discomfort. He stated he liked the way it felt to put weight through his feet.  Pt adjusted back into his chair with seat reclined slightly. He was set up with tray table to prepare for lunch. Pt in chair with call light and phone in reach.    Therapy Documentation Precautions:  Precautions Precautions: Fall     Pain: Pain Assessment Pain Assessment: 0-10 Pain Score: 4  Pain Type: Surgical pain Pain Location: Back Pain Onset: On-going Pain Intervention(s):  (premedicated) ADL: ADL ADL Comments: Refer to FIM  See FIM for  current functional status  Therapy/Group: Individual Therapy  Chloeanne Poteet 04/23/2014, 12:04 PM

## 2014-04-23 NOTE — Progress Notes (Signed)
Physical Therapy Session Note  Patient Details  Name: Jon Lozano MRN: [id number] Date of Birth: [birth year]  Today's Date: 04/23/2014 Time: 3419-6222 and 13:00-13:45 ( )  Time Calculation (min): 45 min  Short Term Goals: Week 1:  PT Short Term Goal 1 (Week 1): pt will move supine> sit with assistance of 1 person PT Short Term Goal 2 (Week 1): pt will transfer bed>< w/c with assistance of 1 person PT Short Term Goal 3 (Week 1): pt will propel w/c x 25' with min assist  Skilled Therapeutic Interventions/Progress Updates:  First tx with recreation therapy for increased engagement with therapeutic activities, including bed mobility, therex, and bed>WC transfers. Began discussing pain and management strategies as pt with strong posterior lean/pushing due to pain/fear of pain/fear of falling.   Performed supine therex for LE strength and circulation: heel slides, ankle pumps, hip ABD.   Performed supine>sit with elevated HOB and +2 for LE and trunk lifting. Heavy assist needed due to posterior pushing. Pt cued to breathe for decreased pain and anxiety.   Performed static sitting EOB x3min with forward reaching tasks in preparation for reaching to steady. Reaching performance improved with practice and encouragement.   +2 needed for lifting to steady, but pt able to assist with bil UE and LEs. Sat unsupported x59min with decent activity tolerance.   Transferred pt to reclining WC with Roho cushion, left up with lap belt and nursing present.   ----------------------------------------------  Second tx focused on WC propulsion, static standing in [place], and functional mobility.  Pt had been up in WC since the morning, very tired and ready to rest.  Pt agreeable to [facility] propulsion, but needing min A more consistently this afternoon x40.' WC mobility marked by R veering, unable to adjust technique with cues.   Performed sit>stand at [facility], pt able to lean forward with less pushing  to don harness. Pt engaged in standing with UE support and hardness x2min while cleaning, changing brief, and using urinal.   Pt transferred to bed with cues for safe sitting technique and needing mod A for sit>supine for LE lifting.  Pt able to assist with adjusting in bed with Mod A for advancing hips to safe position.  Pt left up in bed with all needs and bed alarm on.       Therapy Documentation Precautions:  Precautions Precautions: Fall Precaution Comments: wound VAC on back    Pain: Pain Assessment Pain Assessment: 0-10 Pain Score: 6  Pain Type: Surgical pain Pain Location: Back Pain Onset: On-going Pain Intervention(s): RN made aware;Medication (See eMAR)    Locomotion : Wheelchair Mobility Distance: 25   See FIM for current functional status  Therapy/Group: Individual Therapy Clydene Laming, PT, DPT   04/23/2014, 9:54 AM

## 2014-04-23 NOTE — Progress Notes (Signed)
Recreational Therapy Assessment and Plan  Patient Details  Name: Jon Lozano MRN: [id number] Date of Birth: [birth year] Today's Date: 04/23/2014  Rehab Potential: Good ELOS: 3-4weeks    Assessment Clinical Impression: Problem List:  Patient Active Problem List    Diagnosis  Date Noted   .  Physical deconditioning  04/21/2014    Past Medical History:  Past Medical History   Diagnosis  Date   .  Hypertension    .  Heart murmur    .  Peripheral vascular disease    .  Arthritis    .  Hepatitis  1954    Past Surgical History:  Past Surgical History   Procedure  Laterality  Date   .  Cholecystectomy   1983   .  Pericardial fluid drainage   2007   .  Cataract extraction   2012   .  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 He was d/c to SNF from [facility] deloped purulent drainage on 03/10/14 with wound cultures positive for VRE. Hardware was removed by Dr. Tawanna Cooler and [facility] placed with recommendations for daptomycin as well as cefepime with recommendations for 8 weeks of antibiotic therapy. He was readmitted to [facility] on 03/22/14 for antibiotic as well as therapy. Sputum culture from [place] positive for Kleb-pneumonia and cefepime was changed to meropenum on 03/26/14. ID following for input with recommendations to continue antibiotics through 05/04/14. Was on dysphagia diet with honey liquids and has been advanced to nectars at bedside on 04/09/14. 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 Back wound continues to have copious drainage with areas of dehiscence.  Oxycodone scheduled bid to help with pain control. Foley was discontinued last week with incontinence reported.  Patient transferred to CIR on 04/21/2014.   Pt presents with decreased activity tolerance, decreased functional mobility, decreased balance, joint tightness, & increased pain Limiting pt's independence with leisure/community pursuits.   Leisure History/Participation Premorbid leisure interest/current participation: [place] store;Community - Travel (Comment);Nature - Other (Comment);Sports - Golf ("gentleman farmer") Leisure Participation Style: Alone;With Family/Friends Awareness of Community Resources: Good-identify 3 post discharge leisure resources Psychosocial / Spiritual Does patient have pets?: No Social interaction - Mood/Behavior: Cooperative Engineer, drilling for Education?: Yes Recreational Therapy Orientation Orientation -Reviewed with patient: Available activity resources Strengths/Weaknesses Patient Strengths/Abilities: Willingness to participate;Active premorbidly Patient weaknesses: Physical limitations TR Patient demonstrates impairments in the following area(s): Endurance;Motor;Safety;Skin Integrity  Plan Rec Therapy Plan Is patient appropriate for Therapeutic Recreation?: Yes Rehab Potential: Good Treatment times per week: Min 1 time per week >20 minutes Estimated Length of Stay: 3-4weeks  TR Treatment/Interventions: Adaptive equipment instruction;1:1 session;Balance/vestibular training;Functional mobility training;Community reintegration;Patient/family education;Therapeutic activities;Recreation/leisure participation;Therapeutic exercise;UE/LE Coordination activities;Wheelchair propulsion/positioning  Recommendations for other services: None  Discharge Criteria: Patient will be discharged from TR if patient refuses treatment 3 consecutive times without medical reason.  If treatment  goals not met, if there is a change in medical status, if patient makes no progress towards goals or if patient is discharged from hospital.  The above assessment, treatment plan, treatment alternatives and  goals were discussed and mutually agreed upon: by patient  [place] 04/23/2014, 12:16 PM

## 2014-04-23 NOTE — Progress Notes (Signed)
Speech Language Pathology Daily Session Note  Patient Details  Name: Jon Lozano MRN: [id number] Date of Birth: May 05, 1941  Today's Date: 04/23/2014 Time: 1445-1530 Time Calculation (min): 45 min  Short Term Goals: Week 1: SLP Short Term Goal 1 (Week 1): Patient will recall procedures for Free Water Protocol with Min question cues SLP Short Term Goal 2 (Week 1): Patient will recall and accurately perform pharyngeal strengthening exercises with Supervision  SLP Short Term Goal 3 (Week 1): Patient will participate in on going diagnostic treatment for memory and word retreival  SLP Short Term Goal 4 (Week 1): Patient will demonstrate effectient mastication of regular textures   Skilled Therapeutic Interventions:  Pt was seen for skilled speech therapy targeting dysphagia management. Pt recalled 2 targeted pharyngeal strengthening exercises from yesterday's evaluation/treatment session with supervision question cues.  Pt returned demonstration of the Masako maneuver x10 with intermittent sips of nectar thick liquids with no overt s/s of aspiration.  During trials of thin liquids, pt exhibited intermittently wet vocal quality with larger cup sips which cleared with cued use of a throat clear for airway protection.  Pt did not exhibit any overt signs of distress with trials of water and presents with adequate oral hygiene for initiation of the water protocol.  Pt and pt's wife were educated extensively about the rationale behind the water protocol.  They verbalized understanding of skilled education and all questions were answered to their satisfaction at this time.  Continue per current plan of care, initiate water protocol.  Signs posted in room and initialed by SLP to indicate oral care completed on this date prior to PO trials.    FIM:  Comprehension Comprehension Mode: Auditory Comprehension: 5-Understands complex 90% of the time/Cues < 10% of the time Expression Expression Mode:  Verbal Expression: 5-Expresses basic 90% of the time/requires cueing < 10% of the time. Social Interaction Social Interaction: 6-Interacts appropriately with others with medication or extra time (anti-anxiety, antidepressant). Problem Solving Problem Solving: 5-Solves basic 90% of the time/requires cueing < 10% of the time Memory Memory: 4-Recognizes or recalls 75 - 89% of the time/requires cueing 10 - 24% of the time  Pain Pain Assessment Pain Assessment: No/denies pain  Therapy/Group: Individual Therapy  Jackalyn Lombard, M.A. CCC-SLP   Liviya Santini, Melanee Spry 04/23/2014, 4:39 PM

## 2014-04-23 NOTE — Progress Notes (Signed)
Patient information reviewed and entered into eRehab system by Iwalani Templeton, RN, CRRN, PPS Coordinator.  Information including medical coding and functional independence measure will be reviewed and updated through discharge.     Per nursing patient was given "Data Collection Information Summary for Patients in Inpatient Rehabilitation Facilities with attached "Privacy Act Statement-Health Care Records" upon admission.  

## 2014-04-24 ENCOUNTER — Inpatient Hospital Stay (HOSPITAL_COMMUNITY): Payer: Medicare Other | Admitting: Occupational Therapy

## 2014-04-24 ENCOUNTER — Inpatient Hospital Stay (HOSPITAL_COMMUNITY): Payer: Medicare Other | Admitting: Speech Pathology

## 2014-04-24 ENCOUNTER — Inpatient Hospital Stay (HOSPITAL_COMMUNITY): Payer: Medicare Other | Admitting: *Deleted

## 2014-04-24 LAB — GLUCOSE, CAPILLARY
GLUCOSE-CAPILLARY: 170 mg/dL — AB (ref 70–99)
Glucose-Capillary: 82 mg/dL (ref 70–99)
Glucose-Capillary: 200 mg/dL — ABNORMAL HIGH (ref 70–99)
Glucose-Capillary: 113 mg/dL — ABNORMAL HIGH (ref 70–99)

## 2014-04-24 NOTE — Progress Notes (Signed)
VRE lumbar infection and MRSA bacteremia.  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 He was d/c to SNF from [facility] deloped purulent drainage on 03/10/14 with wound cultures positive for VRE. Hardware was removed by Dr. Tawanna Cooler and [facility] placed with recommendations for daptomycin as well as cefepime with recommendations for 8 weeks of antibiotic therapy. He was readmitted to [facility] on 03/22/14 for antibiotic as well as therapy. Sputum culture from [place] positive for Kleb-pneumonia and cefepime was changed to meropenum on 03/26/14. ID following for input with recommendations to continue antibiotics through 05/04/14. Was on dysphagia diet with honey liquids and has been advanced to nectars at bedside on 04/09/14. 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 Back wound continues to have copious drainage with areas of dehiscence  Subjective/Complaints: Tolerating therapy, back pain controlled with meds   Review of Systems - Negative except intermittent pain, mostly back but also "general aches and pain" no shooting pain into legs Objective: Vital Signs: Blood pressure 100/70, pulse 85, temperature 97.5 F (36.4 C), temperature source Oral, resp. rate 18, height _0  (1.727 m), weight 83.689 kg (184 lb 8 oz), SpO2 98.00%. Dg Chest 1 View  04/22/2014   CLINICAL DATA:  PICC line placement  EXAM: CHEST - 1 VIEW  COMPARISON:  Portable exam 6945 hr compared to 03/22/2014  FINDINGS: RIGHT arm PICC line tip projects over SVC.  Minimal enlargement of cardiac silhouette.  Mediastinal contours and pulmonary vascularity normal.  Mild persistent LEFT basilar infiltrate.  Improving aeration at RIGHT base.  No pleural effusion or pneumothorax.  IMPRESSION: Tip of RIGHT arm PICC line projects over mid SVC.  Persistent LEFT basilar and improving  RIGHT basilar pulmonary infiltrates.   Electronically Signed   By: Lavonia Dana M.D.   On: 04/22/2014 17:14   Results for orders placed during the hospital encounter of 04/21/14 (from the past 72 hour(s))  GLUCOSE, CAPILLARY     Status: Abnormal   Collection Time    04/21/14  9:15 PM      Result Value Ref Range   Glucose-Capillary 185 (*) 70 - 99 mg/dL  CBC WITH DIFFERENTIAL     Status: Abnormal   Collection Time    04/22/14  6:10 AM      Result Value Ref Range   WBC 5.3  4.0 - 10.5 K/uL   RBC 3.40 (*) 4.22 - 5.81 MIL/uL   Hemoglobin 9.2 (*) 13.0 - 17.0 g/dL   HCT 29.3 (*) 39.0 - 52.0 %   MCV 86.2  78.0 - 100.0 fL   MCH 27.1  26.0 - 34.0 pg   MCHC 31.4  30.0 - 36.0 g/dL   RDW 16.6 (*) 11.5 - 15.5 %   Platelets 171  150 - 400 K/uL   Neutrophils Relative % 72  43 - 77 %   Neutro Abs 3.9  1.7 - 7.7 K/uL   Lymphocytes Relative 7 (*) 12 - 46 %   Lymphs Abs 0.4 (*) 0.7 - 4.0 K/uL   Monocytes Relative 17 (*) 3 - 12 %   Monocytes Absolute 0.9  0.1 - 1.0 K/uL   Eosinophils Relative 3  0 - 5 %   Eosinophils Absolute 0.2  0.0 - 0.7 K/uL   Basophils Relative 1  0 - 1 %   Basophils Absolute 0.0  0.0 - 0.1 K/uL  COMPREHENSIVE METABOLIC PANEL     Status: Abnormal   Collection Time    04/22/14  6:10 AM      Result Value Ref Range   Sodium 141  137 - 147 mEq/L   Potassium 3.5 (*) 3.7 - 5.3 mEq/L   Chloride 97  96 - 112 mEq/L   CO2 29  19 - 32 mEq/L   Glucose, Bld 86  70 - 99 mg/dL   BUN 20  6 - 23 mg/dL   Creatinine, Ser 0.68  0.50 - 1.35 mg/dL   Calcium 9.1  8.4 - 10.5 mg/dL   Total Protein 6.6  6.0 - 8.3 g/dL   Albumin 2.7 (*) 3.5 - 5.2 g/dL   AST 25  0 - 37 U/L   ALT 14  0 - 53 U/L   Alkaline Phosphatase 148 (*) 39 - 117 U/L   Total Bilirubin 0.8  0.3 - 1.2 mg/dL   GFR calc non Af Amer >90  >90 mL/min   GFR calc Af Amer >90  >90 mL/min   Comment: (NOTE)     The eGFR has been calculated using the CKD EPI equation.     This calculation has not been validated in all clinical  situations.     eGFR's persistently <90 mL/min signify possible Chronic Kidney     Disease.  GLUCOSE, CAPILLARY     Status: None   Collection Time    04/22/14  7:12 AM      Result Value Ref Range   Glucose-Capillary 81  70 - 99 mg/dL   Comment 1 Notify RN    MRSA PCR SCREENING     Status: None   Collection Time    04/22/14  9:44 AM      Result Value Ref Range   MRSA by PCR NEGATIVE  NEGATIVE   Comment:            The GeneXpert MRSA Assay (FDA     approved for NASAL specimens     only), is one component of a     comprehensive MRSA colonization     surveillance program. It is not     intended to diagnose MRSA     infection nor to guide or     monitor treatment for     MRSA infections.  GLUCOSE, CAPILLARY     Status: Abnormal   Collection Time    04/22/14 11:16 AM      Result Value Ref Range   Glucose-Capillary 156 (*) 70 - 99 mg/dL   Comment 1 Notify RN    GLUCOSE, CAPILLARY     Status: Abnormal   Collection Time    04/22/14  4:31 PM      Result Value Ref Range   Glucose-Capillary 145 (*) 70 - 99 mg/dL  GLUCOSE, CAPILLARY     Status: Abnormal   Collection Time    04/22/14  9:06 PM      Result Value Ref Range   Glucose-Capillary 165 (*) 70 - 99 mg/dL  GLUCOSE, CAPILLARY     Status: None   Collection Time    04/23/14  7:48 AM      Result Value Ref Range   Glucose-Capillary 82  70 - 99 mg/dL  GLUCOSE, CAPILLARY     Status: Abnormal   Collection Time    04/23/14 11:52 AM      Result Value Ref Range   Glucose-Capillary 107 (*) 70 - 99 mg/dL  GLUCOSE, CAPILLARY     Status: Abnormal   Collection Time    04/23/14  4:52 PM      Result Value Ref Range   Glucose-Capillary 118 (*) 70 - 99 mg/dL  GLUCOSE, CAPILLARY     Status: None   Collection Time    04/23/14  9:46 PM      Result Value Ref Range   Glucose-Capillary 94  70 - 99 mg/dL  GLUCOSE, CAPILLARY     Status: None   Collection Time    04/24/14  7:18 AM      Result Value Ref Range   Glucose-Capillary 82  70 - 99  mg/dL   Comment 1 Notify RN       HEENT: normal Cardio: RRR and no murmur Resp: CTA B/L and unlabored GI: BS positive and NT,ND Extremity:  Pulses positive and No Edema Skin:   Breakdown skin tears on shins, no heel breakdown Neuro: Lethargic, Abnormal Sensory reduced sensory Left L5 and S1 dermatomes and Abnormal Motor 4/5 BUE except 3- at delt Musc/Skel:  LB tender GEN NAD   Assessment/Plan: 1. Functional deficits secondary to deconditioning after wound infection post lumbar fusion which require 3+ hours per day of interdisciplinary therapy in a comprehensive inpatient rehab setting. Physiatrist is providing close team supervision and 24 hour management of active medical problems listed below. Physiatrist and rehab team continue to assess barriers to discharge/monitor patient progress toward functional and medical goals. FIM: FIM - Bathing Bathing Steps Patient Completed: Chest;Right Arm;Left Arm;Abdomen;Right upper leg;Left upper leg Bathing: 3: Mod-Patient completes 5-7 28f10 parts or 50-74%  FIM - Upper Body Dressing/Undressing Upper body dressing/undressing steps patient completed: Put head through opening of pull over shirt/dress;Thread/unthread left sleeve of pullover shirt/dress;Thread/unthread right sleeve of pullover shirt/dresss Upper body dressing/undressing: 4: Min-Patient completed 75 plus % of tasks FIM - Lower Body Dressing/Undressing Lower body dressing/undressing: 1: Total-Patient completed less than 25% of tasks  FIM - Toileting Toileting: 1: Total-Patient completed zero steps, helper did all 3  FIM - TAir cabin crewTransfers: 0-Activity did not occur  FIM - BIT sales professionalTransfer: 1: Mechanical lift  FIM - Locomotion: Wheelchair Distance: 25 Locomotion: Wheelchair: 1: Travels less than 50 ft with minimal assistance (Pt.>75%) FIM - Locomotion: Ambulation Locomotion: Ambulation: 0: Activity did not  occur  Comprehension Comprehension Mode: Auditory Comprehension: 5-Understands complex 90% of the time/Cues < 10% of the time  Expression Expression Mode: Verbal Expression: 5-Expresses basic 90% of the time/requires cueing < 10% of the time.  Social Interaction Social Interaction: 6-Interacts appropriately with others with medication or extra time (anti-anxiety, antidepressant).  Problem Solving Problem Solving: 5-Solves basic 90% of the time/requires cueing < 10% of the time  Memory Memory: 4-Recognizes or recalls 75 - 89% of the time/requires cueing 10 - 24% of the time  Medical Problem List and Plan:  1. Functional deficits secondary to deconditioning from multiple medical issues--back surgery, removal of Infected hardware as well as bacteremia  2. DVT Prophylaxis/Anticoagulation: add Pharmaceutical: Lovenox. 3. Pain Management: Schedule oxycodone prior to therapy as well as prn doses in between. Was using dilaudid for severe pain while on [place].  4. Mood: Will have LCSW follow for evaluation and support.  5. Neuropsych: This patient is capable of making decisions on his own behalf.  6. Stress induced hyperglycemia: continue to check cbgs and use SSI for elevated BS.  7. Back wound: Wet to dry dressings. Continue air mattress overlay  8. Malnutrition: continue with nutritional  supplements tid.  9. RA: on chronic steroids.  10. VRE/MRSA bacteremia with Kleb PNA: Continue daptomycin and cubicin thorough 05/04/14.  11. Anemia of chronic illness: continue iron supplement. 6/11 Hgb 9.2   LOS (Days) 3 A FACE TO FACE EVALUATION WAS PERFORMED  Vanesha Athens E 04/24/2014, 7:30 AM

## 2014-04-24 NOTE — Progress Notes (Signed)
Occupational Therapy Session Notes  Patient Details  Name: Jon Lozano MRN: [id number] Date of Birth: Nov 09, 1941  Today's Date: 04/24/2014 Time: 0800-0900 and 250-350 Time Calculation (min): 60 min and 60 min  Short Term Goals: Week 1:  OT Short Term Goal 1 (Week 1): Pt will tolerate sitting EOB for 10 minutes to engage in UB bathing and dressing with steady A. OT Short Term Goal 2 (Week 1): Pt will don a shirt with min A. OT Short Term Goal 3 (Week 1): Pt will transfer to a BSC or toilet with mod-max A x1.  OT Short Term Goal 4 (Week 1): Pt will be able to cleanse himself on toilet or BSC with mod A.   Skilled Therapeutic Interventions/Progress Updates:  1)  Patient resting in bed upon arrival.  Engaged in self care retraining to include sponge bath and dressing.  Focused session on activity tolerance, managing pain during all transitional movements, adaptive techniques and use of AE to improve independence with LB bath and dress.  Patient performed self care at bed level with HOB up and rolling.  Patient declined OOB during OT session secondary to PT to assist OOB during their session.  Patient required dressing change for his back therefor assisted patient onto his side for RN to complete extensive dressing change.    2)  Patient resting in reclining w/c upon arrival with poor posture and leaning/falling to the right.  After discussion with PT, agreed that patient needed a different w/c.  This clinician located a tilt in space w/c and built up the right lateral portion on the w/c back to provide lateral support and encourage better postural alignment.  Performed [place] Plus transfer to this different w/c and patient looks better and reports feeling much better.  Educated patient on need to be tilted every 20-30 minutes for pressure relief on his bottom and patient practiced reaching and leaning to his left when his postural comes out of alignment.  Patient reported wanting to drink some water  therefore set him up to perform oral care so he could participate in water protocol.  Patient able to take 10 sips of water without coughing.  NT and RN in room to supervise patient taking more sips.    Therapy Documentation Precautions:  Precautions Precautions: Fall Precaution Comments: wound VAC on back Pain: 1)  4/10 in back at rest, 6/10 with activity, RN notified and medication provided, rest, repositioned, activity, adaptive mobility 2)  No report of pain ADL: See FIM for current functional status  Therapy/Group: Individual Therapy for both sessions  Teresina Bugaj 04/24/2014, 9:07 AM

## 2014-04-24 NOTE — Progress Notes (Signed)
Speech Language Pathology Daily Session Note  Patient Details  Name: Jon Lozano MRN: [id number] Date of Birth: 04-15-1941  Today's Date: 04/24/2014 Time: 1100-1130 Time Calculation (min): 30 min  Short Term Goals: Week 1: SLP Short Term Goal 1 (Week 1): Patient will recall procedures for Free Water Protocol with Min question cues SLP Short Term Goal 2 (Week 1): Patient will recall and accurately perform pharyngeal strengthening exercises with Supervision  SLP Short Term Goal 3 (Week 1): Patient will participate in on going diagnostic treatment for memory and word retreival  SLP Short Term Goal 4 (Week 1): Patient will demonstrate effectient mastication of regular textures   Skilled Therapeutic Interventions: Skilled treatment session focused on continued diagnostic treatment of pt's current cognitive function.  SLP facilitated session by providing intermittent Min A verbal cues for thought organization during a functional conversation.  Pt was oriented X 4 and recalled morning events with therapy and nursing staff with Mod I. Pt also recalled he was recently placed on the water protocol and verbalized the reasoning as well as his aspiration precautions with Mod I. Pt demonstrated selective attention for 30 minutes throughout the functional conversation with Mod I. Continue with current plan of care.    FIM:  Comprehension Comprehension Mode: Auditory Comprehension: 5-Understands complex 90% of the time/Cues < 10% of the time Expression Expression Mode: Verbal Expression: 5-Expresses basic 90% of the time/requires cueing < 10% of the time. Social Interaction Social Interaction: 6-Interacts appropriately with others with medication or extra time (anti-anxiety, antidepressant). Problem Solving Problem Solving: 5-Solves basic 90% of the time/requires cueing < 10% of the time Memory Memory: 4-Recognizes or recalls 75 - 89% of the time/requires cueing 10 - 24% of the time  Pain Pain  Assessment Pain Assessment: 0-10 Pain Score: 4  Pain Type: Chronic pain;Surgical pain Pain Location: Back Patients Stated Pain Goal: 3 Pain Intervention(s): Medication (See eMAR) Multiple Pain Sites: No  Therapy/Group: Individual Therapy  Coyt Govoni 04/24/2014, 12:17 PM

## 2014-04-24 NOTE — Progress Notes (Signed)
ANTIBIOTIC CONSULT NOTE - FOLLOW UP  Pharmacy Consult for Daptomycin + Meropenem Indication: Bacteremia  No Known Allergies  Patient Measurements: Height: 5\' 8"  (172.7 cm) Weight: 184 lb 8 oz (83.689 kg) IBW/kg (Calculated) : 68.4  Vital Signs: Temp: 97.5 F (36.4 C) (06/13 0500) Temp src: Oral (06/13 0500) BP: 100/70 mmHg (06/13 0500) Pulse Rate: 85 (06/13 0500) Intake/Output from previous day: 06/12 0701 - 06/13 0700 In: 600 [P.O.:600] Out: 600 [Urine:600] Intake/Output from this shift: Total I/O In: 140 [P.O.:140] Out: -   Labs:  Recent Labs  04/22/14 0610  WBC 5.3  HGB 9.2*  PLT 171  CREATININE 0.68   Estimated Creatinine Clearance: 89.2 ml/min (by C-G formula based on Cr of 0.68). No results found for this basename: VANCOTROUGH, 06/22/14, VANCORANDOM, GENTTROUGH, GENTPEAK, GENTRANDOM, TOBRATROUGH, TOBRAPEAK, TOBRARND, AMIKACINPEAK, AMIKACINTROU, AMIKACIN,  in the last 72 hours   Microbiology: Recent Results (from the past 720 hour(s))  CULTURE, BLOOD (ROUTINE X 2)     Status: None   Collection Time    04/06/14  3:30 PM      Result Value Ref Range Status   Specimen Description BLOOD LEFT ARM   Final   Special Requests BOTTLES DRAWN AEROBIC ONLY 3CC   Final   Culture  Setup Time     Final   Value: 04/06/2014 20:05     Performed at 04/08/2014   Culture     Final   Value: NO GROWTH 5 DAYS     Performed at [facility]   Report Status 04/12/2014 FINAL   Final  CULTURE, BLOOD (ROUTINE X 2)     Status: None   Collection Time    04/06/14  3:37 PM      Result Value Ref Range Status   Specimen Description BLOOD LEFT ARM   Final   Special Requests BOTTLES DRAWN AEROBIC ONLY 3CC   Final   Culture  Setup Time     Final   Value: 04/06/2014 20:05     Performed at 04/08/2014   Culture     Final   Value: NO GROWTH 5 DAYS     Performed at [facility]   Report Status 04/12/2014 FINAL   Final  MRSA PCR SCREENING     Status:  None   Collection Time    04/22/14  9:44 AM      Result Value Ref Range Status   MRSA by PCR NEGATIVE  NEGATIVE Final   Comment:            The GeneXpert MRSA Assay (FDA     approved for NASAL specimens     only), is one component of a     comprehensive MRSA colonization     surveillance program. It is not     intended to diagnose MRSA     infection nor to guide or     monitor treatment for     MRSA infections.    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 At SNF he developed purulent drainage on 03/10/14 with wound cultures positive for VRE. Hardware was removed by Dr. Alger Memos and [facility] placed with recommendations for daptomycin/cefepime for 8 weeks of antibiotic therapy. Cefepime changed to Merrem on 03/26/14 for Kleb. Pneumonia in sputum. Back wound continues to have copious drainage with areas of dehiscence. Plan to con't abx through 05/04/14. The patient's last CK on 6/9 was 42, will plan to check weekly CK levels while on Dapto. Renal function stable - doses appropriate.  Cubicin 03/10/14>> Cefepime 4/29>>5/15 Merrem 5/15>>  Last doses at [facility]:  -Cubicin 500mg  daily (missed dose 6/9?), dose 0800 6/10 -Merrem: last dose 6/10 1400  Goal of Therapy:  8 week treatment of VRE wound infection s/p back surgery  Plan:  1. Continue Daptomycin 500 mg (~6 mg/kg) IV every 24 hours 2. Continue Meropenem 1g IV every 8 hours 3. Weekly CK levels on Tuesdays while on Dapto 4. Will  continue to monitor renal function for any necessary dose adjustments  05-20-1973, PharmD, BCPS Clinical Pharmacist Pager: [phone] 04/24/2014 2:54 PM

## 2014-04-24 NOTE — IPOC Note (Signed)
Overall Plan of Care [facility]) Patient Details Name: Jon Lozano MRN: [id number] DOB: [birth year]  Admitting Diagnosis: Hx of Lumb Sx  wound infecton hardware removal  PNA  Hospital Problems: Active Problems:   Physical deconditioning     Functional Problem List: Nursing Bowel;Edema;Endurance;Medication Management;Nutrition;Motor;Pain;Perception;Safety;Sensory;Skin Integrity  PT Balance;Endurance;Motor;Pain;Safety;Sensory;Skin Integrity  OT Balance;Endurance;Motor;Pain;Safety;Sensory  SLP Nutrition  TR Endurance;Motor;Safety;Skin Integrity       Basic ADL's: OT Grooming;Bathing;Dressing;Toileting     Advanced  ADL's: OT       Transfers: PT Bed Mobility;Bed to Chair;Car;Furniture  OT Toilet;Tub/Shower     Locomotion: PT Wheelchair Mobility;Stairs;Ambulation     Additional Impairments: OT None  SLP Swallowing      TR      Anticipated Outcomes Item Anticipated Outcome  Self Feeding I  Swallowing  Set-up Supevision    Basic self-care  supervision bathing, toileting, UB dressing; min LB dressing  Toileting  supervision   Bathroom Transfers supervision  Bowel/Bladder  continent of bowel and bladder with mod I assist of bowel and level 5 for urinal  Transfers  supervision; min assist car  Locomotion  w/c modified independent x 150'; gait TBD  Communication     Cognition     Pain  Pain controled with prn/scheduled meds at or below level 5  Safety/Judgment  maintain safety with min assist   Therapy Plan: PT Intensity: Minimum of 1-2 x/day ,45 to 90 minutes PT Frequency: 5 out of 7 days PT Duration Estimated Length of Stay: 24-27 days OT Intensity: Minimum of 1-2 x/day, 45 to 90 minutes OT Frequency: 5 out of 7 days OT Duration/Estimated Length of Stay: 3-4 weeks SLP Intensity: Minumum of 1-2 x/day, 30 to 90 minutes SLP Frequency: 5 out of 7 days SLP Duration/Estimated Length of Stay: 3-4 weeks       Team Interventions: Nursing Interventions  Patient/Family Education;Skin Care/Wound Management;Bowel Management;Dysphagia/Aspiration Precaution Training;Disease Management/Prevention;Discharge Planning;Pain Management;Psychosocial Support;Medication Management  PT interventions Ambulation/gait training;Balance/vestibular training;Discharge planning;Community reintegration;DME/adaptive equipment instruction;Functional electrical stimulation;Functional mobility training;Patient/family education;Pain management;Neuromuscular re-education;Psychosocial support;Splinting/orthotics;Therapeutic Exercise;Therapeutic Activities;UE/LE Strength taining/ROM;UE/LE Coordination activities;Wheelchair propulsion/positioning  OT Interventions Financial controller;Functional mobility training;Pain management;Psychosocial support;Self Care/advanced ADL retraining;Patient/family education;Therapeutic Activities;Therapeutic Exercise;UE/LE Strength taining/ROM  SLP Interventions Dysphagia/aspiration precaution training;Environmental controls;Functional tasks;Internal/external aids;Patient/family education;Other (comment) (pharyngeal strengthening exercises)  TR Interventions Adaptive equipment instruction;1:1 session;Balance/vestibular training;Functional mobility training;Community reintegration;Patient/family education;Therapeutic activities;Recreation/leisure participation;Therapeutic exercise;UE/LE Coordination activities;Wheelchair propulsion/positioning  SW/CM Interventions      Team Discharge Planning: Destination: PT-Home ,OT- Home , SLP-Home Projected Follow-up: PT-Home health PT, OT-  Home health OT, SLP-Other (comment);24 hour supervision/assistance (TBD) Projected Equipment Needs: PT-To be determined, OT- 3 in 1 bedside comode;Tub/shower bench, SLP-None recommended by SLP Equipment Details: PT-pt owns [facility] and walker , ? 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 He was d/c to SNF from [facility] deloped purulent drainage on 03/10/14 with wound cultures positive for VRE. Hardware was removed by Dr. Alger Memos and [facility] placed with recommendations for daptomycin as well as cefepime with recommendations for 8 weeks of antibiotic therapy. He was readmitted to [facility] on 03/22/14 for antibiotic as well as therapy. Sputum culture from [place] positive for Kleb-pneumonia and cefepime was changed to meropenum on 03/26/14. ID following for input with recommendations to continue  antibiotics through 05/04/14. Was on dysphagia diet with honey liquids and has been advanced to nectars at bedside on 04/09/14  Now requiring 24/7 Rehab RN,MD, as well as CIR level PT, OT and SLP.  Treatment team will focus on ADLs and mobility with goals set at Supervision   See Team Conference Notes for weekly updates to the plan of care

## 2014-04-24 NOTE — Progress Notes (Signed)
Physical Therapy Session Note  Patient Details  Name: Jon Lozano MRN: [id number] Date of Birth: 1941/10/08  Today's Date: 04/24/2014 Time: 1300-1407 Time Calculation (min): 67 min  Short Term Goals: Week 1:  PT Short Term Goal 1 (Week 1): pt will move supine> sit with assistance of 1 person PT Short Term Goal 2 (Week 1): pt will transfer bed>< w/c with assistance of 1 person PT Short Term Goal 3 (Week 1): pt will propel w/c x 25' with min assist  Skilled Therapeutic Interventions/Progress Updates:  Tx focused on functional mobility, WC mobility, activity tolerance and midline orientation in sitting and standing. Pt received in bed, resting in far R lean, unable to adjust with UEs and cues. Pt's mobility highly limited by pushing tendencies and R trunk lean.   +2 Total assist for supine>sit position with elevated HOB. Pt's sitting balance improved with target for UEs to reach on Sara Plus.   Performed sit<>stand x2 with [place] Dec Plus, mindful of dressing and lumbar wound for positioning. Wound was wet, and nursing aware so came to change during standing in standing frame.   Pt propelled WC x50' with min A for steering and cues for posture and stroke technique. WC tends to list R due to heavy weight on that side.   Performed standing frame x with UE reaching and writing tasks to decrease RUE reliance for balance and improve midline orientation. Pt able to identify upright and leaning positions, but unable to maintain any minimal self correction nor therapist adjusted positions. Discussed WC suggestion for tilt-in-space with lateral supports with OT treating next to try.   While up in frame, pt had small skin tear at R elbow due to heavy R pressure for balance. Nurse came to clean and apply new dressing.   Pt left up in [facility] with belt at chest and hips to maintain upright posture. All needs in reach.       Therapy Documentation Precautions:  Precautions Precautions:  Fall Precaution Comments: wound VAC on back Pain: pt c/o momentary pain during mobility, so modified tx prn.   Locomotion : Wheelchair Mobility Distance: 50   See FIM for current functional status  Therapy/Group: Individual Therapy Clydene Laming, PT, DPT 04/24/2014, 2:33 PM

## 2014-04-25 ENCOUNTER — Inpatient Hospital Stay (HOSPITAL_COMMUNITY): Payer: Medicare Other

## 2014-04-25 DIAGNOSIS — Z1621 Resistance to vancomycin: Secondary | ICD-10-CM

## 2014-04-25 DIAGNOSIS — R7881 Bacteremia: Secondary | ICD-10-CM

## 2014-04-25 DIAGNOSIS — J189 Pneumonia, unspecified organism: Secondary | ICD-10-CM

## 2014-04-25 DIAGNOSIS — R509 Fever, unspecified: Secondary | ICD-10-CM

## 2014-04-25 DIAGNOSIS — R5381 Other malaise: Secondary | ICD-10-CM

## 2014-04-25 DIAGNOSIS — A419 Sepsis, unspecified organism: Secondary | ICD-10-CM

## 2014-04-25 DIAGNOSIS — I10 Essential (primary) hypertension: Secondary | ICD-10-CM | POA: Diagnosis present

## 2014-04-25 DIAGNOSIS — I739 Peripheral vascular disease, unspecified: Secondary | ICD-10-CM

## 2014-04-25 DIAGNOSIS — I471 Supraventricular tachycardia: Secondary | ICD-10-CM

## 2014-04-25 DIAGNOSIS — A491 Streptococcal infection, unspecified site: Secondary | ICD-10-CM

## 2014-04-25 LAB — URINE MICROSCOPIC-ADD ON

## 2014-04-25 LAB — COMPREHENSIVE METABOLIC PANEL
ALT: 14 U/L (ref 0–53)
AST: 26 U/L (ref 0–37)
Albumin: 2.7 g/dL — ABNORMAL LOW (ref 3.5–5.2)
Alkaline Phosphatase: 152 U/L — ABNORMAL HIGH (ref 39–117)
BUN: 18 mg/dL (ref 6–23)
CO2: 28 meq/L (ref 19–32)
Calcium: 9.4 mg/dL (ref 8.4–10.5)
Chloride: 96 mEq/L (ref 96–112)
Creatinine, Ser: 0.75 mg/dL (ref 0.50–1.35)
GFR calc Af Amer: >90 mL/min (ref >90)
GFR calc non Af Amer: >90 mL/min (ref >90)
Glucose, Bld: 174 mg/dL — ABNORMAL HIGH (ref 70–99)
Potassium: 4.1 mEq/L (ref 3.7–5.3)
SODIUM: 138 meq/L (ref 137–147)
Total Bilirubin: 0.8 mg/dL (ref 0.3–1.2)
Total Protein: 6.7 g/dL (ref 6.0–8.3)

## 2014-04-25 LAB — CBC WITH DIFFERENTIAL/PLATELET
Basophils Absolute: 0 10*3/uL (ref 0.0–0.1)
Basophils Relative: 0 % (ref 0–1)
EOS PCT: 0 % (ref 0–5)
Eosinophils Absolute: 0 10*3/uL (ref 0.0–0.7)
HEMATOCRIT: 29.3 % — AB (ref 39.0–52.0)
HEMOGLOBIN: 9.2 g/dL — AB (ref 13.0–17.0)
LYMPHS ABS: 0.3 10*3/uL — AB (ref 0.7–4.0)
LYMPHS PCT: 4 % — AB (ref 12–46)
MCH: 26.8 pg (ref 26.0–34.0)
MCHC: 31.4 g/dL (ref 30.0–36.0)
MCV: 85.4 fL (ref 78.0–100.0)
MONO ABS: 0.8 10*3/uL (ref 0.1–1.0)
MONOS PCT: 11 % (ref 3–12)
Neutro Abs: 5.8 10*3/uL (ref 1.7–7.7)
Neutrophils Relative %: 85 % — ABNORMAL HIGH (ref 43–77)
PLATELETS: 200 10*3/uL (ref 150–400)
RBC: 3.43 MIL/uL — ABNORMAL LOW (ref 4.22–5.81)
RDW: 16.6 % — ABNORMAL HIGH (ref 11.5–15.5)
WBC: 6.9 10*3/uL (ref 4.0–10.5)

## 2014-04-25 LAB — GLUCOSE, CAPILLARY
GLUCOSE-CAPILLARY: 116 mg/dL — AB (ref 70–99)
GLUCOSE-CAPILLARY: 119 mg/dL — AB (ref 70–99)
Glucose-Capillary: 131 mg/dL — ABNORMAL HIGH (ref 70–99)
Glucose-Capillary: 150 mg/dL — ABNORMAL HIGH (ref 70–99)

## 2014-04-25 LAB — URINALYSIS, ROUTINE W REFLEX MICROSCOPIC
Bilirubin Urine: NEGATIVE
GLUCOSE, UA: NEGATIVE mg/dL
Hgb urine dipstick: NEGATIVE
KETONES UR: NEGATIVE mg/dL
Nitrite: NEGATIVE
PH: 8 (ref 5.0–8.0)
Protein, ur: 30 mg/dL — AB
Specific Gravity, Urine: 1.014 (ref 1.005–1.030)
Urobilinogen, UA: 1 mg/dL (ref 0.0–1.0)

## 2014-04-25 LAB — CK: CK TOTAL: 39 U/L (ref 7–232)

## 2014-04-25 MED ORDER — DEXTROSE-NACL 5-0.45 % IV SOLN
INTRAVENOUS | Status: DC
  Administered 2014-04-25 – 2014-04-26 (×2): via INTRAVENOUS

## 2014-04-25 NOTE — Progress Notes (Signed)
Dr. Wynn Banker paged regarding patient's confusion and elevated temperature. VS reported. BS= 150. New orders given

## 2014-04-25 NOTE — Consult Note (Signed)
[facility] Medical Consultation  Jon PandaRonald L Lozano ZOX:[id number] DOB: 11/20/1940 DOA: 04/21/2014 PCP: Pcp Not In System   Requesting physician: Dr. Claudette LawsAndrew Kirsteins Date of consultation: 04/25/14 Reason for consultation: Fever  Impression/Recommendations Principal Problem:   Sepsis(995.91) Active Problems:   Physical deconditioning   HTN (hypertension)   Bacteremia   Pneumonia, organism unspecified   VRE (vancomycin-resistant Enterococci) infection   PVD (peripheral vascular disease)   Fever   Paroxysmal supraventricular tachycardia   1. Sepsis 1. New fevers with AMS in the setting of VRE/MRSA bacteremia and Klebsiella PNA 2. Currently on daptomycin and meropenem tentatively through 6/23 per previous ID recs and will cont for the time being 3. Discussed with primary attending and pt has been pan cultured - will follow cultures. 4. F/u cbc. Will also check lactate given concerns of sepsis 5. Will check UA 6. Pending cultures, may consider formal ID consult 7. Now afebrile after receiving tylenol and mental status much improved per staff and per wife at bedside. 2. HTN 1. BP stable 2. Cont monitor for now 3. Bacteremia 1. On abx per above 2. Repeat pan cultures pending 4. Pna 1. Per above 5. Deconditioning 1. Per primary service  I will followup again tomorrow. Please contact me if I can be of assistance in the meanwhile. Thank you for this consultation.  Chief Complaint: Confusion  HPI:  73yo with a hx of htn, pvd, hx of chronic back pain s/p back surgery which was complicated by MRSA and proteus hardware infection with bacteremia and later found pos for VRE wound infection. Pt was initially continued on dapto and cefepime, however, later developed klebsiella pna and cefepime substituted with meropenem on 03/26/14. Initially, ID recs were for continued antibiotics through 6/23. The patient was continued with physical therapy and on 6/14, noted to be febrile with Tmax  of 103 with increased confusion. Pan cultures were obtained and the hospitalist service consulted for recommendations.  Review of Systems:  Per above, the remainder of the 10pt ros reviewed and are neg  Past Medical History  Diagnosis Date  . Hypertension   . Heart murmur   . Peripheral vascular disease   . Arthritis   . Hepatitis 1954   Past Surgical History  Procedure Laterality Date  . Cholecystectomy  1983  . Pericardial fluid drainage  2007  . Cataract extraction  2012  . Tee without cardioversion  10/23/2012    Procedure: TRANSESOPHAGEAL ECHOCARDIOGRAM (TEE);  Surgeon: Thurmon FairMihai Croitoru, MD;  [facility];  Service: Cardiovascular;  Laterality: N/A;  to come in1130 for work up   Social History:  reports that he has quit smoking. His smoking use included Cigarettes. He smoked 1.00 pack per day. He quit smokeless tobacco use about 2 years ago. He reports that he drinks alcohol. He reports that he does not use illicit drugs.  No Known Allergies Family History  Problem Relation Age of Onset  . Heart disease Father     Prior to Admission medications   Medication Sig Start Date End Date Taking? Authorizing Provider  Amino Acids-Protein Hydrolys (FEEDING SUPPLEMENT, PRO-STAT SUGAR FREE 64,) LIQD Take 30 mLs by mouth 3 (three) times daily with meals.   Yes Historical Provider, MD  aspirin 81 MG tablet Take 81 mg by mouth daily.   Yes Historical Provider, MD  clopidogrel (PLAVIX) 75 MG tablet Take 75 mg by mouth daily.   Yes Historical Provider, MD  DAPTOmycin in sodium chloride 0.9 % 100 mL Inject 500 mg into the  vein daily. 03/13/14  Yes Historical Provider, MD  ferrous sulfate 325 (65 FE) MG tablet Take 325 mg by mouth 3 (three) times daily with meals.   Yes Historical Provider, MD  folic acid (FOLVITE) 1 MG tablet Take 1 mg by mouth daily.   Yes Historical Provider, MD  furosemide (LASIX) 20 MG tablet Take 20 mg by mouth 2 (two) times daily.   Yes Historical Provider, MD   Guaifenesin (MUCINEX MAXIMUM STRENGTH) 1200 MG TB12 Take 1 tablet by mouth 2 (two) times daily.   Yes Historical Provider, MD  lactobacillus acidophilus (BACID) TABS tablet Take 1 tablet by mouth 2 (two) times daily.    Yes Historical Provider, MD  lidocaine (LIDODERM) 5 % Place 1 patch onto the skin daily. Remove & Discard patch within 12 hours or as directed by MD   Yes Historical Provider, MD  meropenem 1 g in sodium chloride 0.9 % 100 mL Inject 1 g into the vein every 8 (eight) hours. For total 6 weeks 03/13/14  Yes Historical Provider, MD  mirtazapine (REMERON) 15 MG tablet Take 15 mg by mouth at bedtime.   Yes Historical Provider, MD  Multiple Vitamins-Minerals (MULTIVITAMINS THER. W/MINERALS) TABS tablet Take 1 tablet by mouth daily.   Yes Historical Provider, MD  oxycodone (OXY-IR) 5 MG capsule Take 5 mg by mouth 2 (two) times daily.   Yes Historical Provider, MD  pantoprazole (PROTONIX) 40 MG tablet Take 40 mg by mouth daily.   Yes Historical Provider, MD  polyethylene glycol (MIRALAX / GLYCOLAX) packet Take 17 g by mouth 2 (two) times daily.   Yes Historical Provider, MD  potassium chloride SA (K-DUR,KLOR-CON) 20 MEQ tablet Take 40 mEq by mouth daily.   Yes Historical Provider, MD  predniSONE (DELTASONE) 10 MG tablet Take 10 mg by mouth daily with breakfast.   Yes Historical Provider, MD  pregabalin (LYRICA) 100 MG capsule Take 100 mg by mouth 3 (three) times daily.   Yes Historical Provider, MD  senna (SENOKOT) 8.6 MG TABS tablet Take 1 tablet by mouth at bedtime.   Yes Historical Provider, MD  tamsulosin (FLOMAX) 0.4 MG CAPS capsule Take 0.4 mg by mouth daily.   Yes Historical Provider, MD  vitamin C (ASCORBIC ACID) 250 MG tablet Take 250 mg by mouth 3 (three) times daily with meals.   Yes Historical Provider, MD  zinc sulfate 220 MG capsule Take 220 mg by mouth daily.   Yes Historical Provider, MD   Physical Exam: Blood pressure 107/67, pulse 118, temperature 99.8 F (37.7 C),  temperature source Oral, resp. rate 18, height 5\' 8"  (1.727 m), weight 181 lb 8 oz (82.328 kg), SpO2 98.00%. Filed Vitals:   04/25/14 0626 04/25/14 1613 04/25/14 1649 04/25/14 1803  BP: 135/76 120/68 107/67   Pulse: 100  118   Temp: 99.5 F (37.5 C) 102 F (38.9 C) 103 F (39.4 C) 99.8 F (37.7 C)  TempSrc: Oral Oral Oral   Resp: 18     Height:      Weight: 181 lb 8 oz (82.328 kg)     SpO2: 98% 96% 98%      General:  Awake, in nad  Eyes: PERRL B  ENT: membranes moist, dentition fair  Neck: trachea midline, neck supple  Cardiovascular: regular, s1, s2  Respiratory: normal resp effort, coarse breath sounds  Abdomen: soft, nondistended  Skin: multiple ulcers under BLE and under lumbar region with dressings   Musculoskeletal: perfused, no clubbing  Psychiatric: mood/affect normal //  no auditory/visual hallucinations  Neurologic: cn2-12 grossly intact, generalized weakness  Labs on Admission:  Basic Metabolic Panel:  Recent Labs Lab 04/20/14 0535 04/22/14 0610  NA 142 141  K 4.2 3.5*  CL 99 97  CO2 33* 29  GLUCOSE 83 86  BUN 18 20  CREATININE 0.76 0.68  CALCIUM 9.5 9.1   Liver Function Tests:  Recent Labs Lab 04/22/14 0610  AST 25  ALT 14  ALKPHOS 148*  BILITOT 0.8  PROT 6.6  ALBUMIN 2.7*   No results found for this basename: LIPASE, AMYLASE,  in the last 168 hours No results found for this basename: AMMONIA,  in the last 168 hours CBC:  Recent Labs Lab 04/20/14 0535 04/22/14 0610 04/25/14 1805  WBC 5.7 5.3 6.9  NEUTROABS  --  3.9 5.8  HGB 9.9* 9.2* 9.2*  HCT 32.7* 29.3* 29.3*  MCV 86.7 86.2 85.4  PLT 188 171 200   Cardiac Enzymes:  Recent Labs Lab 04/20/14 0535  CKTOTAL 42   BNP: No components found with this basename: POCBNP,  CBG:  Recent Labs Lab 04/24/14 1617 04/24/14 2102 04/25/14 0830 04/25/14 1242 04/25/14 1621  GLUCAP 113* 170* 116* 131* 150*    Radiological Exams on Admission: No results found.  Time  spent:  Derin Granquist K [facility] Pager [phone]  If 7PM-7AM, please contact night-coverage [email or web address] Password [email or web address] 04/25/2014, 6:51 PM

## 2014-04-25 NOTE — Progress Notes (Signed)
Have not been able to give Merrem yet d/t pharmacy has not sent up medication yet, after 2 attempts. Called again.

## 2014-04-25 NOTE — Progress Notes (Signed)
VRE lumbar infection and MRSA bacteremia.  HPI: [name] is a 73 year old male with history of HTN, PVD, COPD, Low back pain with radiculopathy LLE who underwent back surgery 01/04/14 complicated by 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 He was d/c to SNF from [facility] deloped purulent drainage on 03/10/14 with wound cultures positive for VRE. Hardware was removed by Dr. Alger Memos and [facility] placed with recommendations for daptomycin as well as cefepime with recommendations for 8 weeks of antibiotic therapy. He was readmitted to [facility] on 03/22/14 for antibiotic as well as therapy. Sputum culture from [place] positive for Kleb-pneumonia and cefepime was changed to meropenum on 03/26/14. ID following for input with recommendations to continue antibiotics through 05/04/14. Was on dysphagia diet with honey liquids and has been advanced to nectars at bedside on 04/09/14. 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 Back wound continues to have copious drainage with areas of dehiscence  Subjective/Complaints: Tolerating therapy, back pain controlled with meds Plans on watching auto racing today  Review of Systems - Negative except intermittent pain, mostly back but also "general aches and pain" no shooting pain into legs Objective: Vital Signs: Blood pressure 135/76, pulse 100, temperature 99.5 F (37.5 C), temperature source Oral, resp. rate 18, height 5\' 8"  (1.727 m), weight 82.328 kg (181 lb 8 oz), SpO2 98.00%. No results found. Results for orders placed during the hospital encounter of 04/21/14 (from the past 72 hour(s))  MRSA PCR SCREENING     Status: None   Collection Time    04/22/14  9:44 AM      Result Value Ref Range   MRSA by PCR NEGATIVE  NEGATIVE   Comment:            The GeneXpert MRSA Assay (FDA     approved for NASAL specimens     only), is one component of a     comprehensive MRSA colonization     surveillance program. It is not     intended to diagnose MRSA      infection nor to guide or     monitor treatment for     MRSA infections.  GLUCOSE, CAPILLARY     Status: Abnormal   Collection Time    04/22/14 11:16 AM      Result Value Ref Range   Glucose-Capillary 156 (*) 70 - 99 mg/dL   Comment 1 Notify RN    GLUCOSE, CAPILLARY     Status: Abnormal   Collection Time    04/22/14  4:31 PM      Result Value Ref Range   Glucose-Capillary 145 (*) 70 - 99 mg/dL  GLUCOSE, CAPILLARY     Status: Abnormal   Collection Time    04/22/14  9:06 PM      Result Value Ref Range   Glucose-Capillary 165 (*) 70 - 99 mg/dL  GLUCOSE, CAPILLARY     Status: None   Collection Time    04/23/14  7:48 AM      Result Value Ref Range   Glucose-Capillary 82  70 - 99 mg/dL  GLUCOSE, CAPILLARY     Status: Abnormal   Collection Time    04/23/14 11:52 AM      Result Value Ref Range   Glucose-Capillary 107 (*) 70 - 99 mg/dL  GLUCOSE, CAPILLARY     Status: Abnormal   Collection Time    04/23/14  4:52 PM      Result Value Ref Range   Glucose-Capillary  118 (*) 70 - 99 mg/dL  GLUCOSE, CAPILLARY     Status: None   Collection Time    04/23/14  9:46 PM      Result Value Ref Range   Glucose-Capillary 94  70 - 99 mg/dL  GLUCOSE, CAPILLARY     Status: None   Collection Time    04/24/14  7:18 AM      Result Value Ref Range   Glucose-Capillary 82  70 - 99 mg/dL   Comment 1 Notify RN    GLUCOSE, CAPILLARY     Status: Abnormal   Collection Time    04/24/14 11:38 AM      Result Value Ref Range   Glucose-Capillary 200 (*) 70 - 99 mg/dL   Comment 1 Notify RN    GLUCOSE, CAPILLARY     Status: Abnormal   Collection Time    04/24/14  4:17 PM      Result Value Ref Range   Glucose-Capillary 113 (*) 70 - 99 mg/dL   Comment 1 Notify RN    GLUCOSE, CAPILLARY     Status: Abnormal   Collection Time    04/24/14  9:02 PM      Result Value Ref Range   Glucose-Capillary 170 (*) 70 - 99 mg/dL     HEENT: normal Cardio: RRR and no murmur Resp: CTA B/L and unlabored GI: BS  positive and NT,ND Extremity:  Pulses positive and No Edema Skin:   Breakdown skin tears on shins, no heel breakdown Neuro: Lethargic, Abnormal Sensory reduced sensory Left L5 and S1 dermatomes and Abnormal Motor 4/5 BUE except 3- at delt Musc/Skel:  LB tender GEN NAD   Assessment/Plan: 1. Functional deficits secondary to deconditioning after wound infection post lumbar fusion which require 3+ hours per day of interdisciplinary therapy in a comprehensive inpatient rehab setting. Physiatrist is providing close team supervision and 24 hour management of active medical problems listed below. Physiatrist and rehab team continue to assess barriers to discharge/monitor patient progress toward functional and medical goals. FIM: FIM - Bathing Bathing Steps Patient Completed: Chest;Right Arm;Left Arm;Abdomen;Right upper leg;Left upper leg;Front perineal area Bathing: 3: Mod-Patient completes 5-7 50f 10 parts or 50-74%  FIM - Upper Body Dressing/Undressing Upper body dressing/undressing steps patient completed: Put head through opening of pull over shirt/dress;Thread/unthread left sleeve of pullover shirt/dress;Thread/unthread right sleeve of pullover shirt/dresss Upper body dressing/undressing: 4: Min-Patient completed 75 plus % of tasks FIM - Lower Body Dressing/Undressing Lower body dressing/undressing steps patient completed:  (pt assisted w/ doff socks, donn pull up & shorts w/ reacher) Lower body dressing/undressing: 1: Total-Patient completed less than 25% of tasks  FIM - Toileting Toileting: 1: Total-Patient completed zero steps, helper did all 3  FIM - Archivist Transfers: 0-Activity did not occur  FIM - Banker Devices: HOB elevated;Bed rails Bed/Chair Transfer: 1: Mechanical lift;1: Two helpers  FIM - Locomotion: Wheelchair Distance: 50 Locomotion: Wheelchair: 2: Travels 50 - 149 ft with minimal assistance (Pt.>75%) FIM -  Locomotion: Ambulation Locomotion: Ambulation: 0: Activity did not occur  Comprehension Comprehension Mode: Auditory Comprehension: 5-Understands complex 90% of the time/Cues < 10% of the time  Expression Expression Mode: Verbal Expression: 5-Expresses basic 90% of the time/requires cueing < 10% of the time.  Social Interaction Social Interaction: 6-Interacts appropriately with others with medication or extra time (anti-anxiety, antidepressant).  Problem Solving Problem Solving: 5-Solves basic 90% of the time/requires cueing < 10% of the time  Memory Memory: 4-Recognizes or recalls  75 - 89% of the time/requires cueing 10 - 24% of the time  Medical Problem List and Plan:  1. Functional deficits secondary to deconditioning from multiple medical issues--back surgery, removal of Infected hardware as well as bacteremia  2. DVT Prophylaxis/Anticoagulation: add Pharmaceutical: Lovenox. 3. Pain Management: Schedule oxycodone prior to therapy as well as prn doses in between. Was using dilaudid for severe pain while on [place].  4. Mood: Will have LCSW follow for evaluation and support.  5. Neuropsych: This patient is capable of making decisions on his own behalf.  6. Stress induced hyperglycemia: continue to check cbgs and use SSI for elevated BS.  7. Back wound: Wet to dry dressings. Continue air mattress overlay  8. Malnutrition: continue with nutritional supplements tid.  9. RA: on chronic steroids.  10. VRE/MRSA bacteremia with Kleb PNA: Continue daptomycin and cubicin thorough 05/04/14.  11. Anemia of chronic illness: continue iron supplement. 6/11 Hgb 9.2   LOS (Days) 4 A FACE TO FACE EVALUATION WAS PERFORMED  Rameen Gohlke E 04/25/2014, 8:16 AM

## 2014-04-26 ENCOUNTER — Inpatient Hospital Stay (HOSPITAL_COMMUNITY): Payer: BLUE CROSS/BLUE SHIELD | Admitting: *Deleted

## 2014-04-26 ENCOUNTER — Inpatient Hospital Stay (HOSPITAL_COMMUNITY): Payer: Medicare Other | Admitting: Speech Pathology

## 2014-04-26 ENCOUNTER — Inpatient Hospital Stay (HOSPITAL_COMMUNITY)
Admission: AD | Admit: 2014-04-26 | Discharge: 2014-05-12 | DRG: 871 | Disposition: A | Discharge status: Hospice | Payer: Medicare Other | Source: Ambulatory Visit | Attending: Internal Medicine | Admitting: Internal Medicine

## 2014-04-26 ENCOUNTER — Encounter (HOSPITAL_COMMUNITY): Payer: BLUE CROSS/BLUE SHIELD | Admitting: Occupational Therapy

## 2014-04-26 ENCOUNTER — Inpatient Hospital Stay (HOSPITAL_COMMUNITY): Payer: Medicare Other

## 2014-04-26 DIAGNOSIS — R6520 Severe sepsis without septic shock: Secondary | ICD-10-CM

## 2014-04-26 DIAGNOSIS — I4891 Unspecified atrial fibrillation: Secondary | ICD-10-CM

## 2014-04-26 DIAGNOSIS — R1310 Dysphagia, unspecified: Secondary | ICD-10-CM

## 2014-04-26 DIAGNOSIS — T8140XA Infection following a procedure, unspecified, initial encounter: Secondary | ICD-10-CM | POA: Diagnosis present

## 2014-04-26 DIAGNOSIS — Z66 Do not resuscitate: Secondary | ICD-10-CM | POA: Diagnosis present

## 2014-04-26 DIAGNOSIS — I272 Pulmonary hypertension, unspecified: Secondary | ICD-10-CM

## 2014-04-26 DIAGNOSIS — I10 Essential (primary) hypertension: Secondary | ICD-10-CM

## 2014-04-26 DIAGNOSIS — I311 Chronic constrictive pericarditis: Secondary | ICD-10-CM | POA: Diagnosis present

## 2014-04-26 DIAGNOSIS — N138 Other obstructive and reflux uropathy: Secondary | ICD-10-CM | POA: Diagnosis not present

## 2014-04-26 DIAGNOSIS — I4819 Other persistent atrial fibrillation: Secondary | ICD-10-CM

## 2014-04-26 DIAGNOSIS — I471 Supraventricular tachycardia, unspecified: Secondary | ICD-10-CM

## 2014-04-26 DIAGNOSIS — G92 Toxic encephalopathy: Secondary | ICD-10-CM | POA: Diagnosis present

## 2014-04-26 DIAGNOSIS — G062 Extradural and subdural abscess, unspecified: Secondary | ICD-10-CM | POA: Diagnosis present

## 2014-04-26 DIAGNOSIS — R531 Weakness: Secondary | ICD-10-CM

## 2014-04-26 DIAGNOSIS — I9589 Other hypotension: Secondary | ICD-10-CM

## 2014-04-26 DIAGNOSIS — Z7901 Long term (current) use of anticoagulants: Secondary | ICD-10-CM

## 2014-04-26 DIAGNOSIS — I350 Nonrheumatic aortic (valve) stenosis: Secondary | ICD-10-CM

## 2014-04-26 DIAGNOSIS — Z87891 Personal history of nicotine dependence: Secondary | ICD-10-CM

## 2014-04-26 DIAGNOSIS — G929 Unspecified toxic encephalopathy: Secondary | ICD-10-CM | POA: Diagnosis present

## 2014-04-26 DIAGNOSIS — I482 Chronic atrial fibrillation, unspecified: Secondary | ICD-10-CM

## 2014-04-26 DIAGNOSIS — G061 Intraspinal abscess and granuloma: Secondary | ICD-10-CM

## 2014-04-26 DIAGNOSIS — I440 Atrioventricular block, first degree: Secondary | ICD-10-CM | POA: Diagnosis not present

## 2014-04-26 DIAGNOSIS — I739 Peripheral vascular disease, unspecified: Secondary | ICD-10-CM

## 2014-04-26 DIAGNOSIS — R627 Adult failure to thrive: Secondary | ICD-10-CM | POA: Diagnosis present

## 2014-04-26 DIAGNOSIS — E43 Unspecified severe protein-calorie malnutrition: Secondary | ICD-10-CM | POA: Diagnosis present

## 2014-04-26 DIAGNOSIS — I318 Other specified diseases of pericardium: Secondary | ICD-10-CM

## 2014-04-26 DIAGNOSIS — M810 Age-related osteoporosis without current pathological fracture: Secondary | ICD-10-CM

## 2014-04-26 DIAGNOSIS — I050 Rheumatic mitral stenosis: Secondary | ICD-10-CM

## 2014-04-26 DIAGNOSIS — R011 Cardiac murmur, unspecified: Secondary | ICD-10-CM | POA: Diagnosis present

## 2014-04-26 DIAGNOSIS — J189 Pneumonia, unspecified organism: Secondary | ICD-10-CM

## 2014-04-26 DIAGNOSIS — Z515 Encounter for palliative care: Secondary | ICD-10-CM

## 2014-04-26 DIAGNOSIS — I82401 Acute embolism and thrombosis of unspecified deep veins of right lower extremity: Secondary | ICD-10-CM

## 2014-04-26 DIAGNOSIS — E876 Hypokalemia: Secondary | ICD-10-CM | POA: Diagnosis present

## 2014-04-26 DIAGNOSIS — Z1621 Resistance to vancomycin: Secondary | ICD-10-CM

## 2014-04-26 DIAGNOSIS — J9600 Acute respiratory failure, unspecified whether with hypoxia or hypercapnia: Secondary | ICD-10-CM | POA: Diagnosis not present

## 2014-04-26 DIAGNOSIS — R4182 Altered mental status, unspecified: Secondary | ICD-10-CM | POA: Diagnosis present

## 2014-04-26 DIAGNOSIS — M4646 Discitis, unspecified, lumbar region: Secondary | ICD-10-CM

## 2014-04-26 DIAGNOSIS — I509 Heart failure, unspecified: Secondary | ICD-10-CM | POA: Diagnosis present

## 2014-04-26 DIAGNOSIS — D649 Anemia, unspecified: Secondary | ICD-10-CM | POA: Diagnosis present

## 2014-04-26 DIAGNOSIS — Z8249 Family history of ischemic heart disease and other diseases of the circulatory system: Secondary | ICD-10-CM

## 2014-04-26 DIAGNOSIS — R339 Retention of urine, unspecified: Secondary | ICD-10-CM | POA: Diagnosis not present

## 2014-04-26 DIAGNOSIS — A419 Sepsis, unspecified organism: Principal | ICD-10-CM

## 2014-04-26 DIAGNOSIS — N401 Enlarged prostate with lower urinary tract symptoms: Secondary | ICD-10-CM | POA: Diagnosis not present

## 2014-04-26 DIAGNOSIS — I959 Hypotension, unspecified: Secondary | ICD-10-CM

## 2014-04-26 DIAGNOSIS — M869 Osteomyelitis, unspecified: Secondary | ICD-10-CM | POA: Diagnosis present

## 2014-04-26 DIAGNOSIS — J438 Other emphysema: Secondary | ICD-10-CM | POA: Diagnosis present

## 2014-04-26 DIAGNOSIS — R5381 Other malaise: Secondary | ICD-10-CM

## 2014-04-26 DIAGNOSIS — M069 Rheumatoid arthritis, unspecified: Secondary | ICD-10-CM | POA: Diagnosis present

## 2014-04-26 DIAGNOSIS — E785 Hyperlipidemia, unspecified: Secondary | ICD-10-CM | POA: Diagnosis present

## 2014-04-26 DIAGNOSIS — I080 Rheumatic disorders of both mitral and aortic valves: Secondary | ICD-10-CM | POA: Diagnosis present

## 2014-04-26 DIAGNOSIS — M4640 Discitis, unspecified, site unspecified: Secondary | ICD-10-CM | POA: Diagnosis present

## 2014-04-26 DIAGNOSIS — R7309 Other abnormal glucose: Secondary | ICD-10-CM | POA: Diagnosis present

## 2014-04-26 DIAGNOSIS — Y838 Other surgical procedures as the cause of abnormal reaction of the patient, or of later complication, without mention of misadventure at the time of the procedure: Secondary | ICD-10-CM | POA: Diagnosis present

## 2014-04-26 DIAGNOSIS — A491 Streptococcal infection, unspecified site: Secondary | ICD-10-CM

## 2014-04-26 DIAGNOSIS — B372 Candidiasis of skin and nail: Secondary | ICD-10-CM | POA: Diagnosis not present

## 2014-04-26 DIAGNOSIS — I5033 Acute on chronic diastolic (congestive) heart failure: Secondary | ICD-10-CM | POA: Diagnosis present

## 2014-04-26 DIAGNOSIS — K219 Gastro-esophageal reflux disease without esophagitis: Secondary | ICD-10-CM | POA: Diagnosis present

## 2014-04-26 DIAGNOSIS — I2789 Other specified pulmonary heart diseases: Secondary | ICD-10-CM | POA: Diagnosis present

## 2014-04-26 DIAGNOSIS — Z7902 Long term (current) use of antithrombotics/antiplatelets: Secondary | ICD-10-CM

## 2014-04-26 DIAGNOSIS — R509 Fever, unspecified: Secondary | ICD-10-CM

## 2014-04-26 DIAGNOSIS — IMO0002 Reserved for concepts with insufficient information to code with codable children: Secondary | ICD-10-CM

## 2014-04-26 DIAGNOSIS — J150 Pneumonia due to Klebsiella pneumoniae: Secondary | ICD-10-CM | POA: Diagnosis not present

## 2014-04-26 DIAGNOSIS — M549 Dorsalgia, unspecified: Secondary | ICD-10-CM

## 2014-04-26 DIAGNOSIS — J9819 Other pulmonary collapse: Secondary | ICD-10-CM | POA: Diagnosis present

## 2014-04-26 DIAGNOSIS — R6521 Severe sepsis with septic shock: Secondary | ICD-10-CM

## 2014-04-26 DIAGNOSIS — F411 Generalized anxiety disorder: Secondary | ICD-10-CM | POA: Diagnosis not present

## 2014-04-26 DIAGNOSIS — Z7982 Long term (current) use of aspirin: Secondary | ICD-10-CM

## 2014-04-26 DIAGNOSIS — R0600 Dyspnea, unspecified: Secondary | ICD-10-CM

## 2014-04-26 DIAGNOSIS — E861 Hypovolemia: Secondary | ICD-10-CM | POA: Diagnosis present

## 2014-04-26 DIAGNOSIS — G9340 Encephalopathy, unspecified: Secondary | ICD-10-CM

## 2014-04-26 DIAGNOSIS — R7881 Bacteremia: Secondary | ICD-10-CM

## 2014-04-26 DIAGNOSIS — Z86718 Personal history of other venous thrombosis and embolism: Secondary | ICD-10-CM

## 2014-04-26 HISTORY — DX: Nonrheumatic aortic (valve) stenosis: I35.0

## 2014-04-26 HISTORY — DX: Other specified diseases of pericardium: I31.8

## 2014-04-26 HISTORY — DX: Unspecified atrial fibrillation: I48.91

## 2014-04-26 HISTORY — DX: Rheumatic mitral stenosis: I05.0

## 2014-04-26 LAB — GLUCOSE, CAPILLARY
GLUCOSE-CAPILLARY: 195 mg/dL — AB (ref 70–99)
Glucose-Capillary: 118 mg/dL — ABNORMAL HIGH (ref 70–99)
Glucose-Capillary: 159 mg/dL — ABNORMAL HIGH (ref 70–99)

## 2014-04-26 LAB — POCT I-STAT 3, ART BLOOD GAS (G3+)
ACID-BASE EXCESS: 7 mmol/L — AB (ref 0.0–2.0)
Bicarbonate: 32.5 mEq/L — ABNORMAL HIGH (ref 20.0–24.0)
O2 Saturation: 99 %
Patient temperature: 98.6
TCO2: 34 mmol/L (ref 0–100)
pCO2 arterial: 48.6 mmHg — ABNORMAL HIGH (ref 35.0–45.0)
pH, Arterial: 7.433 (ref 7.350–7.450)
pO2, Arterial: 134 mmHg — ABNORMAL HIGH (ref 80.0–100.0)

## 2014-04-26 LAB — URINE CULTURE
Colony Count: NO GROWTH
Culture: NO GROWTH

## 2014-04-26 MED ORDER — SODIUM CHLORIDE 0.9 % IV BOLUS (SEPSIS)
1000.0000 mL | INTRAVENOUS | Status: AC
  Administered 2014-04-26 (×2): 1000 mL via INTRAVENOUS

## 2014-04-26 MED ORDER — OXYCODONE HCL 5 MG PO TABS: 5.0000 mg | ORAL_TABLET | ORAL | Status: DC | PRN

## 2014-04-26 MED ORDER — INSULIN ASPART 100 UNIT/ML ~~LOC~~ SOLN: 1.0000 [IU] | SUBCUTANEOUS | Status: DC

## 2014-04-26 MED ORDER — HYDROCORTISONE NA SUCCINATE PF 100 MG IJ SOLR
50.0000 mg | Freq: Four times a day (QID) | INTRAMUSCULAR | Status: DC
  Administered 2014-04-26 – 2014-04-29 (×10): 50 mg via INTRAVENOUS
  Filled 2014-04-26 (×14): qty 1

## 2014-04-26 MED ORDER — SODIUM CHLORIDE 0.9 % IV SOLN
500.0000 mg | INTRAVENOUS | Status: DC
  Administered 2014-04-27 – 2014-05-03 (×7): 500 mg via INTRAVENOUS
  Filled 2014-04-26 (×9): qty 10

## 2014-04-26 MED ORDER — HEPARIN SODIUM (PORCINE) 5000 UNIT/ML IJ SOLN
5000.0000 [IU] | Freq: Three times a day (TID) | INTRAMUSCULAR | Status: DC
  Administered 2014-04-26 – 2014-04-27 (×2): 5000 [IU] via SUBCUTANEOUS
  Filled 2014-04-26 (×5): qty 1

## 2014-04-26 MED ORDER — ASPIRIN 300 MG RE SUPP
300.0000 mg | Freq: Once | RECTAL | Status: AC
  Administered 2014-04-26: 300 mg via RECTAL
  Filled 2014-04-26: qty 1

## 2014-04-26 MED ORDER — NALOXONE HCL 0.4 MG/ML IJ SOLN: 0.4000 mg | INTRAMUSCULAR | Status: DC | PRN

## 2014-04-26 MED ORDER — SODIUM CHLORIDE 0.9 % IV SOLN
1.0000 g | Freq: Three times a day (TID) | INTRAVENOUS | Status: AC
  Administered 2014-04-26 – 2014-05-04 (×24): 1 g via INTRAVENOUS
  Filled 2014-04-26 (×27): qty 1

## 2014-04-26 MED ORDER — SODIUM CHLORIDE 0.9 % IV SOLN
INTRAVENOUS | Status: DC
  Administered 2014-04-26: 1 mL via INTRAVENOUS
  Administered 2014-04-28 – 2014-05-09 (×7): via INTRAVENOUS
  Administered 2014-05-09: 1000 mL via INTRAVENOUS
  Administered 2014-05-10: 04:00:00 via INTRAVENOUS

## 2014-04-26 MED ORDER — SODIUM CHLORIDE 0.9 % IV SOLN
250.0000 mL | INTRAVENOUS | Status: DC | PRN
Start: 2014-04-26 — End: 2014-05-09

## 2014-04-26 MED ORDER — PANTOPRAZOLE SODIUM 40 MG IV SOLR
40.0000 mg | INTRAVENOUS | Status: DC
  Administered 2014-04-26: 40 mg via INTRAVENOUS
  Filled 2014-04-26 (×2): qty 40

## 2014-04-26 MED ORDER — ACETAMINOPHEN 325 MG PO TABS
650.0000 mg | ORAL_TABLET | ORAL | Status: DC | PRN
Start: 2014-04-26 — End: 2014-04-26

## 2014-04-26 NOTE — Progress Notes (Signed)
Patient with recurrent febrile episode with fever 102, tachycardia and hypoxia with lethargy. Was repositioned and placed on oxygen per [place]. Treated with tylenol with fever on downward trend. He sounds congested with concerns of being in CHF. Has had fluids ongoing and last CXR with evidence of interstitial prominence.  Will likely need ID input due to continued fevers and copious clear drainage. Wound bed does look better today with decrease in yellow eschar at proximal aspect of incision.

## 2014-04-26 NOTE — Progress Notes (Signed)
Social Work Assessment and Plan  Patient Details  Name: WYNNE JURY MRN: [id number] Date of Birth: Nov 04, 1941  Today's Date: 04/26/2014  Problem List:  Patient Active Problem List   Diagnosis Date Noted  . HTN (hypertension) 04/25/2014  . Bacteremia 04/25/2014  . Pneumonia, organism unspecified 04/25/2014  . VRE (vancomycin-resistant Enterococci) infection 04/25/2014  . PVD (peripheral vascular disease) 04/25/2014  . Fever 04/25/2014  . Paroxysmal supraventricular tachycardia 04/25/2014  . Sepsis(995.91) 04/25/2014  . Physical deconditioning 04/21/2014   Past Medical History:  Past Medical History  Diagnosis Date  . Hypertension   . Heart murmur   . Peripheral vascular disease   . Arthritis   . Hepatitis 1954   Past Surgical History:  Past Surgical History  Procedure Laterality Date  . Cholecystectomy  1983  . Pericardial fluid drainage  2007  . Cataract extraction  2012  . Tee without cardioversion  10/23/2012    Procedure: TRANSESOPHAGEAL ECHOCARDIOGRAM (TEE);  Surgeon: Sanda Klein, MD;  Location: [facility];  Service: Cardiovascular;  Laterality: N/A;  to come [id number] for work up   Social History:  reports that he has quit smoking. His smoking use included Cigarettes. He smoked 1.00 pack per day. He quit smokeless tobacco use about 2 years ago. He reports that he drinks alcohol. He reports that he does not use illicit drugs.  Family / Support Systems Marital Status: Married How Long?: 77 years Spouse/Significant Other: Tashawn Laswell - wife- [phone] Children: 2 children, son in [place] and dtr near [place] Other Supports: friends  Anticipated Caregiver: Wife for supervision and paid caregivers for physical care assistance. Ability/Limitations of Caregiver: She cannot provide all physical assistance to pt, but she can provide supervision.  She is willing to hire caregivers for physical care. Caregiver Availability: 24/7 Family Dynamics: Pt and wife  have a close relationship.  Children are supportive, but do not live close by.  Social History Preferred language: English Religion: Lutheran Education: On the job training with some college level math classes once pt was working. Read: Yes Write: Yes Employment Status: Retired Date Retired/Disabled/Unemployed: 2008 Age Retired: 65 Public relations account executive Issues: None reported Guardian/Conservator: N/A   Abuse/Neglect Physical Abuse: Denies Verbal Abuse: Denies Sexual Abuse: Denies Exploitation of patient/patient's resources: Denies Self-Neglect: Denies  Emotional Status Pt's affect, behavior and adjustment status: Pt has a positve outllook on his recovery, despite the multiple hospitalizations and the length of time he's been away from home.  He did become a little emotional as he reminisced about his children and his brother's past and all the good he has done with his life. Recent Psychosocial Issues: Pt with extended hospitalization at 8 different admissions to hospitals and SNFs. Psychiatric History: None reported Substance Abuse History: None reported  Patient / Family Perceptions, Expectations & Goals Pt/Family understanding of illness & functional limitations: Pt and wife have a great understanding of pt's condition and pt's wife has kept written records of pt's medical care course.   Premorbid pt/family roles/activities: Pt enjoys watching sports, especially basketball; spending time with his wife and family; and reading. Anticipated changes in roles/activities/participation: Pt will have support from his wife and can hire people to assist with household chores as needed. Pt/family expectations/goals: "I just want to go home."  [employer]: None Premorbid Home Care/DME Agencies: Other (Comment) (had [facility] in the past.  Already has hospital bed, bedside commode, shower seat, walker, 4 prong cane and single point cane.  Wife feels  pt  will need an air alternating mattress for pt/) Transportation available at discharge: wife Resource referrals recommended: Neuropsychology  Discharge Planning Living Arrangements: Spouse/significant other Support Systems: Spouse/significant other;Children;Other relatives;Friends/neighbors Type of Residence: Private residence Insurance Resources: Education officer, museum (specify) ([employer]) Museum/gallery curator Resources: Fish farm manager;Other (Comment) (retirement plan) Financial Screen Referred: No Living Expenses: Own Money Management: Patient;Spouse Does the patient have any problems obtaining your medications?: No Home Management: Pt's wife can manage home and she will get help as needed for things around the house she cannot do. Patient/Family Preliminary Plans: Pt plans to return to his home at d/c.  Pt's wife feels pt will need 24/7 caregivers. Barriers to Discharge: Steps Social Work Anticipated Follow Up Needs: HH/OP Expected length of stay: 3 - 4 weeks  Clinical Impression CSW met with pt to introduce self and role of CSW, as well as to complete assessment.  Later, CSW spoke with pt's wife via telephone to get her thoughts on pt's d/c plan.  She stated that pt lives in their basement and has everything he needs there and he can ride in the golf cart to get into the main level of the home.  Pt's wife is prepared to hire 24/7 caregivers if pt will need that level of assistance, as she can only provide supervision and no hands on assistance.  CSW offered private duty list and wife would like this.  CSW place it in pt's room for her to review when she came to visit pt.  CSW explained team conference and that we will have a targeted d/c date on Wednesday.  She was pleased to know that they would have a goal that pt can work toward.  No immediate needs/questions/concerns, except that pt's wife does feel pt will need an air alternating mattress for his hospital bed.  CSW will follow  up on this closer to d/c. Pt is in good spirits, despite being in medical facilities for 4 months.  CSW will continue to offer pt support and opportunities for sharing and life review, as this seemed to help pt a great deal.   Wife will need support, too.    Arley Salamone, Silvestre Mesi 04/26/2014, 9:56 AM

## 2014-04-26 NOTE — Progress Notes (Signed)
SLP Cancellation Note  Patient Details Name: BRYSTEN REISTER MRN: [id number] DOB: August 18, 1941   Cancelled treatment:       Amount of Missed SLP Time (min): 45 Minutes                                                                                          Patient unable to participate due to nursing care involving wound vac.  Given chart review and documented changes in lung sounds as well as need for O2 recommend discontinuing water protocol at this time.   Fae Pippin, M.A., CCC-SLP [phone]   Nari Vannatter 04/26/2014, 4:31 PM

## 2014-04-26 NOTE — Progress Notes (Signed)
Occupational Therapy Session Note  Patient Details  Name: Jon Lozano MRN: [id number] Date of Birth: 13-Apr-1941  Today's Date: 04/26/2014 Time: 0730-0830 Time Calculation (min): 60 min  Short Term Goals: Week 1:  OT Short Term Goal 1 (Week 1): Pt will tolerate sitting EOB for 10 minutes to engage in UB bathing and dressing with steady A. OT Short Term Goal 2 (Week 1): Pt will don a shirt with min A. OT Short Term Goal 3 (Week 1): Pt will transfer to a BSC or toilet with mod-max A x1.  OT Short Term Goal 4 (Week 1): Pt will be able to cleanse himself on toilet or BSC with mod A.   Skilled Therapeutic Interventions/Progress Updates:    Pt was seen for individual OT treatment session for ADL retraining w/ focus on activity tolerance, bed mobility, participation, self care and following directions. Pt appeared to required increased assistance w/ sponge bathing this morning than was last noted. Pt was w/ c/o pain, however he had difficulty completing tasks and following directions during grooming and bathing UB w/ HOB elevated as he was noted to frequently close his eyes and require both verbal and tactile reminders of task at hand. Pt was overall total A LB bathe/dress and Max assist UB bathe/dress today. Pt was repositioned in bed after self care tasks were completed and his call bell and phone was in reach, pt was dozing/eyes closed.  Therapy Documentation Precautions:  Precautions Precautions: Fall Precaution Comments: wound VAC on back General:   Vital Signs: Therapy Vitals Temp: 98 F (36.7 C) Temp src: Oral Pulse Rate: 112 Resp: 19 BP: 132/82 mmHg Patient Position (if appropriate): Sitting Oxygen Therapy SpO2: 98 % Pain: Pain Assessment Pain Score: 0-No pain  ADL: ADL ADL Comments: Refer to FIM Exercises:     See FIM for current functional status  Therapy/Group: Individual Therapy  Alm Bustard 04/26/2014, 12:26 PM

## 2014-04-26 NOTE — Progress Notes (Addendum)
VRE lumbar infection and MRSA bacteremia.  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 He was d/c to SNF from [facility] deloped purulent drainage on 03/10/14 with wound cultures positive for VRE. Hardware was removed by Dr. Tawanna Cooler and [facility] placed with recommendations for daptomycin as well as cefepime with recommendations for 8 weeks of antibiotic therapy. He was readmitted to [facility] on 03/22/14 for antibiotic as well as therapy. Sputum culture from [facility] positive for Kleb-pneumonia and cefepime was changed to meropenum on 03/26/14. ID following for input with recommendations to continue antibiotics through 05/04/14. Was on dysphagia diet with honey liquids and has been advanced to nectars at bedside on 04/09/14. 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 Back wound continues to have copious drainage with areas of dehiscence  Subjective/Complaints: Back pain controlled. More aler this morning.   Review of Systems - Negative except intermittent pain, mostly back but also "general aches and pain" no shooting pain into legs Objective: Vital Signs: Blood pressure 134/80, pulse 117, temperature 98 F (36.7 C), temperature source Oral, resp. rate 20, height $RemoveBe'5\' 8"'[id number]$  (1.727 m), weight 82.101 kg (181 lb), SpO2 92.00%. Dg Chest 2 View  04/25/2014   CLINICAL DATA:  Short of breath.  Fever.  Back pain.  EXAM: CHEST  2 VIEW  COMPARISON:  04/22/2014.  FINDINGS: Right upper extremity PICC is unchanged. The cardiopericardial silhouette remains enlarged. Bilateral basilar predominant airspace disease with diffuse interstitial prominence. Compared to the prior exam, there is no interval change aside from lower lung volumes. No pleural effusion.  IMPRESSION: 1. Unchanged right upper extremity PICC with the tip in the mid SVC. 2. Lower lung volumes  with interstitial and alveolar basilar opacities.   Electronically Signed   By: Dereck Ligas M.D.   On: 04/25/2014 19:27   Results for orders placed during the hospital encounter of 04/21/14 (from the past 72 hour(s))  GLUCOSE, CAPILLARY     Status: Abnormal   Collection Time    04/23/14 11:52 AM      Result Value Ref Range   Glucose-Capillary 107 (*) 70 - 99 mg/dL  GLUCOSE, CAPILLARY     Status: Abnormal   Collection Time    04/23/14  4:52 PM      Result Value Ref Range   Glucose-Capillary 118 (*) 70 - 99 mg/dL  GLUCOSE, CAPILLARY     Status: None   Collection Time    04/23/14  9:46 PM      Result Value Ref Range   Glucose-Capillary 94  70 - 99 mg/dL  GLUCOSE, CAPILLARY     Status: None   Collection Time    04/24/14  7:18 AM      Result Value Ref Range   Glucose-Capillary 82  70 - 99 mg/dL   Comment 1 Notify RN    GLUCOSE, CAPILLARY     Status: Abnormal   Collection Time    04/24/14 11:38 AM      Result Value Ref Range   Glucose-Capillary 200 (*) 70 - 99 mg/dL   Comment 1 Notify RN    GLUCOSE, CAPILLARY     Status: Abnormal   Collection Time    04/24/14  4:17 PM      Result Value Ref Range   Glucose-Capillary 113 (*) 70 - 99 mg/dL   Comment 1 Notify  RN    GLUCOSE, CAPILLARY     Status: Abnormal   Collection Time    04/24/14  9:02 PM      Result Value Ref Range   Glucose-Capillary 170 (*) 70 - 99 mg/dL  GLUCOSE, CAPILLARY     Status: Abnormal   Collection Time    04/25/14  8:30 AM      Result Value Ref Range   Glucose-Capillary 116 (*) 70 - 99 mg/dL  GLUCOSE, CAPILLARY     Status: Abnormal   Collection Time    04/25/14 12:42 PM      Result Value Ref Range   Glucose-Capillary 131 (*) 70 - 99 mg/dL  GLUCOSE, CAPILLARY     Status: Abnormal   Collection Time    04/25/14  4:21 PM      Result Value Ref Range   Glucose-Capillary 150 (*) 70 - 99 mg/dL  CBC WITH DIFFERENTIAL     Status: Abnormal   Collection Time    04/25/14  6:05 PM      Result Value Ref Range    WBC 6.9  4.0 - 10.5 K/uL   RBC 3.43 (*) 4.22 - 5.81 MIL/uL   Hemoglobin 9.2 (*) 13.0 - 17.0 g/dL   HCT 67.7 (*) 42.4 - 20.8 %   MCV 85.4  78.0 - 100.0 fL   MCH 26.8  26.0 - 34.0 pg   MCHC 31.4  30.0 - 36.0 g/dL   RDW 29.9 (*) 07.9 - 30.8 %   Platelets 200  150 - 400 K/uL   Neutrophils Relative % 85 (*) 43 - 77 %   Neutro Abs 5.8  1.7 - 7.7 K/uL   Lymphocytes Relative 4 (*) 12 - 46 %   Lymphs Abs 0.3 (*) 0.7 - 4.0 K/uL   Monocytes Relative 11  3 - 12 %   Monocytes Absolute 0.8  0.1 - 1.0 K/uL   Eosinophils Relative 0  0 - 5 %   Eosinophils Absolute 0.0  0.0 - 0.7 K/uL   Basophils Relative 0  0 - 1 %   Basophils Absolute 0.0  0.0 - 0.1 K/uL  COMPREHENSIVE METABOLIC PANEL     Status: Abnormal   Collection Time    04/25/14  6:05 PM      Result Value Ref Range   Sodium 138  137 - 147 mEq/L   Potassium 4.1  3.7 - 5.3 mEq/L   Chloride 96  96 - 112 mEq/L   CO2 28  19 - 32 mEq/L   Glucose, Bld 174 (*) 70 - 99 mg/dL   BUN 18  6 - 23 mg/dL   Creatinine, Ser 9.78  0.50 - 1.35 mg/dL   Calcium 9.4  8.4 - 17.5 mg/dL   Total Protein 6.7  6.0 - 8.3 g/dL   Albumin 2.7 (*) 3.5 - 5.2 g/dL   AST 26  0 - 37 U/L   ALT 14  0 - 53 U/L   Alkaline Phosphatase 152 (*) 39 - 117 U/L   Total Bilirubin 0.8  0.3 - 1.2 mg/dL   GFR calc non Af Amer >90  >90 mL/min   GFR calc Af Amer >90  >90 mL/min   Comment: (NOTE)     The eGFR has been calculated using the CKD EPI equation.     This calculation has not been validated in all clinical situations.     eGFR's persistently <90 mL/min signify possible Chronic Kidney     Disease.  CK  Status: None   Collection Time    04/25/14  6:05 PM      Result Value Ref Range   Total CK 39  7 - 232 U/L  URINALYSIS, ROUTINE W REFLEX MICROSCOPIC     Status: Abnormal   Collection Time    04/25/14  6:12 PM      Result Value Ref Range   Color, Urine YELLOW  YELLOW   APPearance CLOUDY (*) CLEAR   Specific Gravity, Urine 1.014  1.005 - 1.030   pH 8.0  5.0 - 8.0    Glucose, UA NEGATIVE  NEGATIVE mg/dL   Hgb urine dipstick NEGATIVE  NEGATIVE   Bilirubin Urine NEGATIVE  NEGATIVE   Ketones, ur NEGATIVE  NEGATIVE mg/dL   Protein, ur 30 (*) NEGATIVE mg/dL   Urobilinogen, UA 1.0  0.0 - 1.0 mg/dL   Nitrite NEGATIVE  NEGATIVE   Leukocytes, UA TRACE (*) NEGATIVE  URINE MICROSCOPIC-ADD ON     Status: Abnormal   Collection Time    04/25/14  6:12 PM      Result Value Ref Range   Squamous Epithelial / LPF RARE  RARE   WBC, UA 3-6  <3 WBC/hpf   RBC / HPF 0-2  <3 RBC/hpf   Bacteria, UA FEW (*) RARE   Casts HYALINE CASTS (*) NEGATIVE   Urine-Other RARE YEAST     Comment: AMORPHOUS URATES/PHOSPHATES  GLUCOSE, CAPILLARY     Status: Abnormal   Collection Time    04/25/14  9:48 PM      Result Value Ref Range   Glucose-Capillary 119 (*) 70 - 99 mg/dL  GLUCOSE, CAPILLARY     Status: Abnormal   Collection Time    04/26/14  7:44 AM      Result Value Ref Range   Glucose-Capillary 118 (*) 70 - 99 mg/dL     HEENT: normal Cardio: RRR and no murmur Resp: CTA B/L and unlabored GI: BS positive and NT,ND Extremity:  Pulses positive and No Edema Skin:   Breakdown skin tears on shins, no heel breakdown  -low back back wound with serous drainage. Substantial fibronecrotic tissue present within wound. No odor. Neuro: alert, intention tremor both UE's. Follows simple commands. Limited insight and awareness,  Abnormal Sensory reduced sensory Left L5 and S1 dermatomes and Abnormal Motor 4/5 BUE except 3- at delt Musc/Skel:  LB tender GEN NAD   Assessment/Plan: 1. Functional deficits secondary to deconditioning after wound infection post lumbar fusion which require 3+ hours per day of interdisciplinary therapy in a comprehensive inpatient rehab setting. Physiatrist is providing close team supervision and 24 hour management of active medical problems listed below. Physiatrist and rehab team continue to assess barriers to discharge/monitor patient progress toward  functional and medical goals.   FIM: FIM - Bathing Bathing Steps Patient Completed: Chest;Right Arm;Left Arm;Abdomen;Right upper leg;Left upper leg;Front perineal area Bathing: 3: Mod-Patient completes 5-7 37f 10 parts or 50-74%  FIM - Upper Body Dressing/Undressing Upper body dressing/undressing steps patient completed: Put head through opening of pull over shirt/dress;Thread/unthread left sleeve of pullover shirt/dress;Thread/unthread right sleeve of pullover shirt/dresss Upper body dressing/undressing: 4: Min-Patient completed 75 plus % of tasks FIM - Lower Body Dressing/Undressing Lower body dressing/undressing steps patient completed: Don/Doff right sock;Don/Doff left sock;Thread/unthread right pants leg;Thread/unthread left pants leg Lower body dressing/undressing: 1: Total-Patient completed less than 25% of tasks  FIM - Toileting Toileting: 1: Total-Patient completed zero steps, helper did all 3  FIM - Air cabin crew Transfers: 0-Activity did not  occur  FIM - Engineer, site Assistive Devices: HOB elevated;Bed rails Bed/Chair Transfer: 1: Mechanical lift;1: Two helpers  FIM - Locomotion: Wheelchair Distance: 50 Locomotion: Wheelchair: 2: Travels 50 - 149 ft with minimal assistance (Pt.>75%) FIM - Locomotion: Ambulation Locomotion: Ambulation: 0: Activity did not occur  Comprehension Comprehension Mode: Auditory Comprehension: 5-Understands basic 90% of the time/requires cueing < 10% of the time  Expression Expression Mode: Verbal Expression: 5-Expresses basic 90% of the time/requires cueing < 10% of the time.  Social Interaction Social Interaction: 6-Interacts appropriately with others with medication or extra time (anti-anxiety, antidepressant).  Problem Solving Problem Solving: 5-Solves basic 90% of the time/requires cueing < 10% of the time  Memory Memory: 4-Recognizes or recalls 75 - 89% of the time/requires cueing 10 - 24% of the  time  Medical Problem List and Plan:  1. Functional deficits secondary to deconditioning from multiple medical issues--back surgery, removal of Infected hardware as well as bacteremia  2. DVT Prophylaxis/Anticoagulation: add Pharmaceutical: Lovenox.  3. Pain Management: Schedule oxycodone prior to therapy as well as prn doses in between. Was using dilaudid for severe pain while on [facility].  4. Mood: Will have LCSW follow for evaluation and support.  5. Neuropsych: This patient is capable of making decisions on his own behalf.  6. Stress induced hyperglycemia: continue to check cbgs and use SSI for elevated BS.  7. Back wound: Wet to dry dressings. Continue air mattress overlay   -wet- dry dressing  -would like to discuss with [place] RN   8. Malnutrition: continue with nutritional supplements tid.  9. RA: on chronic steroids.  10. VRE/MRSA bacteremia with Kleb PNA: Continue daptomycin through 05/04/14.   -meropenem added  yesterday  -altered mental status due to infection? narc cause for confusion?  -labs unremarkable yesterday  -blood/urine cultures pending 11. Anemia of chronic illness: continue iron supplement. 6/11 Hgb 9.2   LOS (Days) 5 A FACE TO FACE EVALUATION WAS PERFORMED  SWARTZ,ZACHARY T 04/26/2014, 10:09 AM

## 2014-04-26 NOTE — H&P (Signed)
PULMONARY / CRITICAL CARE MEDICINE   Name: Jon Lozano MRN: [id number] DOB: 1941-05-10    ADMISSION DATE:  04/26/2014 CONSULTATION DATE:  04/26/2014  REFERRING MD :  EDP PRIMARY SERVICE: PCCM  CHIEF COMPLAINT:  Hypotension, AMS  BRIEF PATIENT DESCRIPTION: 73 y.o. M who underwent back surgery 01/04/14, complicated by hardware infection with MRSA, VRE, and proteus bacteremia.  He was initially on [facility], then d/c'd to SNF on 4/29.  Hardware was removed and he was readmitted to [facility] on 5/11.  Course further complicated by Klebsiella PNA on sputum culture.  On 6/10, was transferred to CIR then 6/15 developed new fever, altered mental status, lethargy.  PCCM was consulted.  SIGNIFICANT EVENTS / STUDIES:  2/23 - back surgery. 4/29 - d/c'd to SNF. 5/11 - admitted to [facility] (after hardware removal, ? Date). 6/10 - transferred to CIR. 6/15 - developed fever to 102, tachycardia, hypoxia, AMS, lethargy.  LINES / TUBES: Right PICC >>>  CULTURES: Blood 4/1 >>> neg Blood 5/26 >>> neg Blood ? Date >>> POS MRSA, VRE, PROTEUS. Sputum ? Date >>> POS KLEBSIELLA. Blood 6/15 >>> Urine 6/15 >>>  ANTIBIOTICS: Cefepime ? >>> 5/15  Daptomycin >>> (tentatively through 6/23 per ID) Meropenem >>> (tentatively through 6/23 per ID)   HISTORY OF PRESENT ILLNESS:  Pt is encephalopathic; therefore, this HPI is obtained from chart review. 73 y.o. M who underwent back surgery 01/04/14, complicated by hardware infection with MRSA, VRE, and proteus bacteremia.  He was initially on [facility], then d/c'd to SNF on 4/29.  Hardware was removed and he was readmitted to [facility] on 5/11.  Course further complicated by Klebsiella PNA on sputum culture.  On 6/10, was transferred to CIR then 6/15 developed new fever, altered mental status, lethargy.  Rapid Response team called due to pt being minimally responsive with hypotension and bradycardia.  Upon RRT arrival, pt noted to be bradycardic in 30's.  250cc bolus administered with  improvement in HR to 80's and improvement in mental status.  PCCM was consulted for transfer to ICU.  PAST MEDICAL HISTORY :  Past Medical History  Diagnosis Date  . Hypertension   . Heart murmur   . Peripheral vascular disease   . Arthritis   . Hepatitis 1954   Past Surgical History  Procedure Laterality Date  . Cholecystectomy  1983  . Pericardial fluid drainage  2007  . Cataract extraction  2012  . Tee without cardioversion  10/23/2012    Procedure: TRANSESOPHAGEAL ECHOCARDIOGRAM (TEE);  Surgeon: Thurmon Fair, MD;  Location: [facility] ENDOSCOPY;  Service: Cardiovascular;  Laterality: N/A;  to come in1130 for work up   Prior to Admission medications   Medication Sig Start Date End Date Taking? Authorizing Provider  Amino Acids-Protein Hydrolys (FEEDING SUPPLEMENT, PRO-STAT SUGAR FREE 64,) LIQD Take 30 mLs by mouth 3 (three) times daily with meals.    Historical Provider, MD  aspirin 81 MG tablet Take 81 mg by mouth daily.    Historical Provider, MD  clopidogrel (PLAVIX) 75 MG tablet Take 75 mg by mouth daily.    Historical Provider, MD  DAPTOmycin in sodium chloride 0.9 % 100 mL Inject 500 mg into the vein daily. 03/13/14   Historical Provider, MD  ferrous sulfate 325 (65 FE) MG tablet Take 325 mg by mouth 3 (three) times daily with meals.    Historical Provider, MD  folic acid (FOLVITE) 1 MG tablet Take 1 mg by mouth daily.    Historical Provider, MD  furosemide (LASIX) 20 MG  tablet Take 20 mg by mouth 2 (two) times daily.    Historical Provider, MD  Guaifenesin [facility] MAXIMUM STRENGTH) 1200 MG TB12 Take 1 tablet by mouth 2 (two) times daily.    Historical Provider, MD  lactobacillus acidophilus (BACID) TABS tablet Take 1 tablet by mouth 2 (two) times daily.     Historical Provider, MD  lidocaine (LIDODERM) 5 % Place 1 patch onto the skin daily. Remove & Discard patch within 12 hours or as directed by MD    Historical Provider, MD  meropenem 1 g in sodium chloride 0.9 % 100 mL Inject 1 g  into the vein every 8 (eight) hours. For total 6 weeks 03/13/14   Historical Provider, MD  mirtazapine (REMERON) 15 MG tablet Take 15 mg by mouth at bedtime.    Historical Provider, MD  Multiple Vitamins-Minerals (MULTIVITAMINS THER. W/MINERALS) TABS tablet Take 1 tablet by mouth daily.    Historical Provider, MD  oxycodone (OXY-IR) 5 MG capsule Take 5 mg by mouth 2 (two) times daily.    Historical Provider, MD  pantoprazole (PROTONIX) 40 MG tablet Take 40 mg by mouth daily.    Historical Provider, MD  polyethylene glycol (MIRALAX / GLYCOLAX) packet Take 17 g by mouth 2 (two) times daily.    Historical Provider, MD  potassium chloride SA (K-DUR,KLOR-CON) 20 MEQ tablet Take 40 mEq by mouth daily.    Historical Provider, MD  predniSONE (DELTASONE) 10 MG tablet Take 10 mg by mouth daily with breakfast.    Historical Provider, MD  pregabalin (LYRICA) 100 MG capsule Take 100 mg by mouth 3 (three) times daily.    Historical Provider, MD  senna (SENOKOT) 8.6 MG TABS tablet Take 1 tablet by mouth at bedtime.    Historical Provider, MD  tamsulosin (FLOMAX) 0.4 MG CAPS capsule Take 0.4 mg by mouth daily.    Historical Provider, MD  vitamin C (ASCORBIC ACID) 250 MG tablet Take 250 mg by mouth 3 (three) times daily with meals.    Historical Provider, MD  zinc sulfate 220 MG capsule Take 220 mg by mouth daily.    Historical Provider, MD   No Known Allergies  FAMILY HISTORY:  Family History  Problem Relation Age of Onset  . Heart disease Father    SOCIAL HISTORY:  reports that he has quit smoking. His smoking use included Cigarettes. He smoked 1.00 pack per day. He quit smokeless tobacco use about 2 years ago. He reports that he drinks alcohol. He reports that he does not use illicit drugs.  REVIEW OF SYSTEMS:  Unable to complete as pt is encephalopathic.   SUBJECTIVE:   VITAL SIGNS: Temp:  [97.4 F (36.3 C)-102 F (38.9 C)] 97.4 F (36.3 C) (06/15 2028) Pulse Rate:  [81-130] 81 (06/15 2028) Resp:   [18-22] 18 (06/15 2028) BP: (78-134)/(52-82) 106/66 mmHg (06/15 2150) SpO2:  [81 %-100 %] 100 % (06/15 2033) Weight:  [78.7 kg (173 lb 8 oz)-82.101 kg (181 lb)] 78.7 kg (173 lb 8 oz) (06/15 2100) HEMODYNAMICS:   VENTILATOR SETTINGS:   INTAKE / OUTPUT: Intake/Output   None     PHYSICAL EXAMINATION: General: Elderly ill appearing male, minimally responsive. Neuro: Opens eyes and withdraws to pain. + gag. HEENT: [place]/AT. PERRL, sclerae anicteric. Cardiovascular: RRR, no M/R/G.  Lungs: Respirations even and unlabored.  CTA bilaterally, No W/R/R. Abdomen: BS x 4, soft, NT/ND.  Musculoskeletal: vascular ulcers BLE's.  No edema. Skin: Intact, warm, no rashes.    LABS: PULMONARY  Recent Labs  Lab 04/26/14 2135  PHART 7.433  PCO2ART 48.6*  PO2ART 134.0*  HCO3 32.5*  TCO2 34  O2SAT 99.0    CBC  Recent Labs Lab 04/20/14 0535 04/22/14 0610 04/25/14 1805  HGB 9.9* 9.2* 9.2*  HCT 32.7* 29.3* 29.3*  WBC 5.7 5.3 6.9  PLT 188 171 200    COAGULATION No results found for this basename: INR,  in the last 168 hours  CARDIAC  No results found for this basename: TROPONINI,  in the last 168 hours No results found for this basename: PROBNP,  in the last 168 hours   CHEMISTRY  Recent Labs Lab 04/20/14 0535 04/22/14 0610 04/25/14 1805  NA 142 141 138  K 4.2 3.5* 4.1  CL 99 97 96  CO2 33* 29 28  GLUCOSE 83 86 174*  BUN 18 20 18   CREATININE 0.76 0.68 0.75  CALCIUM 9.5 9.1 9.4   Estimated Creatinine Clearance: 81.9 ml/min (by C-G formula based on Cr of 0.75).   LIVER  Recent Labs Lab 04/22/14 0610 04/25/14 1805  AST 25 26  ALT 14 14  ALKPHOS 148* 152*  BILITOT 0.8 0.8  PROT 6.6 6.7  ALBUMIN 2.7* 2.7*     INFECTIOUS No results found for this basename: LATICACIDVEN, PROCALCITON,  in the last 168 hours   ENDOCRINE CBG (last 3)   Recent Labs  04/26/14 0744 04/26/14 1139 04/26/14 1610  GLUCAP 118* 159* 195*         IMAGING x48h  Dg Chest  2 View  04/26/2014   CLINICAL DATA:  Hypoxia.  Question CHF  EXAM: CHEST  2 VIEW  COMPARISON:  04/25/2014  FINDINGS: Right arm PICC tip in the SVC unchanged.  Diffusely increased lung markings unchanged from the prior study. Question mild CHF versus chronic lung disease. No significant effusion.  IMPRESSION: Prominent lung markings are stable and may be due to chronic CHF or chronic lung disease and fibrosis. No superimposed edema or effusion.   Electronically Signed   By: 04/27/2014 M.D.   On: 04/26/2014 17:15   Dg Chest 2 View  04/25/2014   CLINICAL DATA:  Short of breath.  Fever.  Back pain.  EXAM: CHEST  2 VIEW  COMPARISON:  04/22/2014.  FINDINGS: Right upper extremity PICC is unchanged. The cardiopericardial silhouette remains enlarged. Bilateral basilar predominant airspace disease with diffuse interstitial prominence. Compared to the prior exam, there is no interval change aside from lower lung volumes. No pleural effusion.  IMPRESSION: 1. Unchanged right upper extremity PICC with the tip in the mid SVC. 2. Lower lung volumes with interstitial and alveolar basilar opacities.   Electronically Signed   By: 06/22/2014 M.D.   On: 04/25/2014 19:27      ASSESSMENT / PLAN:  PULMONARY A: At risk for intubation - protecting airway on own at the moment Atelectasis P:   - CXR in AM. - Pulmonary toiletry as able.  CARDIOVASCULAR A:  Hypotension - appears hypovolemic on exam. R/o septic shock P:  - IVF boluses x 2L. - Goal MAP > 65. - Trend lactate.  RENAL A:   No acute issues. P:   - 2L NS bolus then NS @ 75. - BMP now and in AM.  GASTROINTESTINAL A:   Nutrition GERD P:   - NPO. - Pantoprazole.  HEMATOLOGIC A:  Mild anemia P:  - Transfuse for Hgb < 7. - VTE Proph:  Heparin / SCD's. - CBC now and in AM. - Hold Plavix while NPO,  consider restarting in AM.  INFECTIOUS A:   MRSA, VRE, Proteus Bacteremia P:   - Blood / Urine cultures. - Continue Daptomycin /  Meropenem (per chart review, continue through 6/23). - Consider ID consult. - ICU PCT Algorithm.  ENDOCRINE A:   Concern for AI Hyperglycemia P:   - STAT cortisol. - Stress steroids. - ICU hyperglycemic protocol.  NEUROLOGIC A:   Acute encephalopathy P:   - Hold outpatient Mirtazapine.   Rutherford Guys, [place] - [facility] [place] [facility] Pgr: [phone]  or [phone] 04/26/2014, 10:48 PM   STAFF NOTE: I, Dr Lavinia Sharps have personally reviewed patient's available data, including medical history, events of note, physical examination and test results as part of my evaluation. I have discussed with resident/NP and other care providers such as pharmacist, RN and RRT.  In addition,  I personally evaluated patient and elicited key findings of  LTAC, long hospitalized patient. Now febrile, acute encephalopahty and hypotension. Mental status improved with fluids.  Check sepsis biomarkers. Intubate if worsens. Likely needs ID consult tomorrow.  Rest per NP/medical resident whose note is outlined above and that I agree with  The patient is critically ill with multiple organ systems failure and requires high complexity decision making for assessment and support, frequent evaluation and titration of therapies, application of advanced monitoring technologies and extensive interpretation of multiple databases.   Critical Care Time devoted to patient care services described in this note is  35  Minutes.  Dr. Kalman Shan, M.D., [facility].C.P Pulmonary and Critical Care Medicine Staff Physician [facility] [place] [facility] Pager: [phone], If no answer or between  15:00h - 7:00h: call [phone]  04/26/2014 11:35 PM

## 2014-04-26 NOTE — Progress Notes (Signed)
Physical Therapy Session Note  Patient Details  Name: Jon Lozano MRN: [id number] Date of Birth: [birth year]  Today's Date: 04/26/2014 Time: 4008-6761 ; 1040-1105; 9509-3267  Time Calculation (min): 55 min , 25 min, 30 min  Short Term Goals: Week 1:  PT Short Term Goal 1 (Week 1): pt will move supine> sit with assistance of 1 person PT Short Term Goal 2 (Week 1): pt will transfer bed>< w/c with assistance of 1 person PT Short Term Goal 3 (Week 1): pt will propel w/c x 25' with min assist     Skilled Therapeutic Interventions/Progress Updates:   Tx 1:  ain rated 5/10; low back; premedicated.  Pt very lethargic, only able to attend about 3 seconds before closing eyes and dozing again.  Oriented to name, date with choices due to problems staying on task without prompts.  Active assistive ROM bil LEs; ankle PF bil against min resistance.  Bed mobility supine with HOB elevated> R partial sidelying, x 5, with tactile cues and rails.  +2 supine> sit ; sat EOB x 3 minutes with max assist.  +2 Sara plus transfer; first attempt did not work well as pad slipped up.  Pt did assist with bil hands on grips.  In recliner w/c, pt assisted with positioning for more symmetrical trunk as he tends to list R.  Pain did not increase with sitting.  Stretching bil hamstrings and heel cords in reclined sitting, x 30 seconds x 2.  Tx 2:  Pt stated that he preferred to stay up in w/c at this time; still drowsy with poor attention. Pain LB, 5/10 premedicated.neuromuscular re-education bil LE to increase strength for functional mobility, as above.  Plan to return and transfer back to bed.  RN reported that pt is febrile and must be on ABX continuously due to S and S, possible sepsis.  Tx 3:  Due to decline in ability to assist, w/c> bed using MaxiMove with NT.  Pt assisted with wt shifting in w/c so that sling could be placed behind his back and under thighs. Pt stated that he would like to lie on his R side; he  was positioned for comfort in partial R sidelying with minimal use of pillows.  Max assist for lateral movements of trunk in bed, limited by increases in pain, LB.  RN present for pain meds to follow.  Tilt in space w/c would be more appropriate for pt.  Therapy Documentation Precautions:  Precautions Precautions: Fall Precaution Comments: wound VAC on back        See FIM for current functional status  Therapy/Group: Individual Therapy  Rainier Feuerborn 04/26/2014, 5:41 PM

## 2014-04-26 NOTE — Progress Notes (Signed)
[facility] PROGRESS NOTE  Jon Lozano MVE:[id number] DOB: [birth year] DOA: 04/21/2014 PCP: Pcp Not In System  Assessment/Plan: 1. Sepsis  1. New fevers with AMS in the setting of VRE/MRSA bacteremia and Klebsiella PNA 2. Remains on daptomycin and meropenem tentatively through 6/23 per previous ID recs and will cont for the time being 3. Pt has been pan cultured - will follow cultures, pending 4. No leukocytosis and afebrile overnight and this AM 5. UA neg for UTI 6. Pending culture results, may consider formal ID consult 7. Now afebrile after receiving tylenol and mental status much improved per staff and per wife at bedside. 2. HTN  1. BP stable 2. Cont monitor for now 3. Bacteremia  1. On abx per above 2. Repeat pan cultures pending 4. Pna  1. Per above 5. Deconditioning  1. Per primary service   HPI/Subjective: No acute events overnight  Objective: Filed Vitals:   04/25/14 1803 04/25/14 2205 04/26/14 0603 04/26/14 0957  BP:  121/78 134/80 132/82  Pulse:  87 117 112  Temp: 99.8 F (37.7 C) 100.3 F (37.9 C) 98.6 F (37 C) 98 F (36.7 C)  TempSrc:  Oral Oral Oral  Resp:   20 19  Height:      Weight:   181 lb (82.101 kg)   SpO2:  96% 92%     Intake/Output Summary (Last 24 hours) at 04/26/14 1024 Last data filed at 04/26/14 0900  Gross per 24 hour  Intake    880 ml  Output      0 ml  Net    880 ml   Filed Weights   04/24/14 0700 04/25/14 0626 04/26/14 0603  Weight: 184 lb 8 oz (83.689 kg) 181 lb 8 oz (82.328 kg) 181 lb (82.101 kg)    Exam:   General:  Awake, in nad  Cardiovascular: regular, s1, s2  Respiratory: normal resp effort, no wheezing  Abdomen: soft,nondistended  Musculoskeletal: perfused, no clubbing   Data Reviewed: Basic Metabolic Panel:  Recent Labs Lab 04/20/14 0535 04/22/14 0610 04/25/14 1805  NA 142 141 138  K 4.2 3.5* 4.1  CL 99 97 96  CO2 33* 29 28  GLUCOSE 83 86 174*  BUN 18 20 18   CREATININE 0.76 0.68  0.75  CALCIUM 9.5 9.1 9.4   Liver Function Tests:  Recent Labs Lab 04/22/14 0610 04/25/14 1805  AST 25 26  ALT 14 14  ALKPHOS 148* 152*  BILITOT 0.8 0.8  PROT 6.6 6.7  ALBUMIN 2.7* 2.7*   No results found for this basename: LIPASE, AMYLASE,  in the last 168 hours No results found for this basename: AMMONIA,  in the last 168 hours CBC:  Recent Labs Lab 04/20/14 0535 04/22/14 0610 04/25/14 1805  WBC 5.7 5.3 6.9  NEUTROABS  --  3.9 5.8  HGB 9.9* 9.2* 9.2*  HCT 32.7* 29.3* 29.3*  MCV 86.7 86.2 85.4  PLT 188 171 200   Cardiac Enzymes:  Recent Labs Lab 04/20/14 0535 04/25/14 1805  CKTOTAL 42 39   BNP (last 3 results)  Recent Labs  02/06/14 0605 02/15/14 0640  PROBNP 2426.0* 1292.0*   CBG:  Recent Labs Lab 04/25/14 0830 04/25/14 1242 04/25/14 1621 04/25/14 2148 04/26/14 0744  GLUCAP 116* 131* 150* 119* 118*    Recent Results (from the past 240 hour(s))  MRSA PCR SCREENING     Status: None   Collection Time    04/22/14  9:44 AM      Result Value Ref  Range Status   MRSA by PCR NEGATIVE  NEGATIVE Final   Comment:            The GeneXpert MRSA Assay (FDA     approved for NASAL specimens     only), is one component of a     comprehensive MRSA colonization     surveillance program. It is not     intended to diagnose MRSA     infection nor to guide or     monitor treatment for     MRSA infections.     Studies: Dg Chest 2 View  04/25/2014   CLINICAL DATA:  Short of breath.  Fever.  Back pain.  EXAM: CHEST  2 VIEW  COMPARISON:  04/22/2014.  FINDINGS: Right upper extremity PICC is unchanged. The cardiopericardial silhouette remains enlarged. Bilateral basilar predominant airspace disease with diffuse interstitial prominence. Compared to the prior exam, there is no interval change aside from lower lung volumes. No pleural effusion.  IMPRESSION: 1. Unchanged right upper extremity PICC with the tip in the mid SVC. 2. Lower lung volumes with interstitial and  alveolar basilar opacities.   Electronically Signed   By: Andreas Newport M.D.   On: 04/25/2014 19:27    Scheduled Meds: . antiseptic oral rinse  15 mL Mouth Rinse BID  . aspirin EC  81 mg Oral Daily  . calcium-vitamin D  1 tablet Oral BID  . clopidogrel  75 mg Oral Q breakfast  . collagenase   Topical Daily  . DAPTOmycin (CUBICIN)  IV  500 mg Intravenous Q24H  . enoxaparin (LOVENOX) injection  40 mg Subcutaneous Q24H  . feeding supplement (PRO-STAT SUGAR FREE 64)  30 mL Oral TID WC  . folic acid  1 mg Oral Daily  . furosemide  20 mg Oral BID  . guaiFENesin  1,200 mg Oral BID  . insulin aspart  0-5 Units Subcutaneous QHS  . insulin aspart  0-9 Units Subcutaneous TID WC  . iron polysaccharides  150 mg Oral TID  . lidocaine  1 patch Transdermal Q24H  . meropenem (MERREM) IV  1 g Intravenous 3 times per day  . mirtazapine  15 mg Oral QHS  . multivitamin with minerals  1 tablet Oral Daily  . oxyCODONE  10 mg Oral BID  . pantoprazole  40 mg Oral Daily  . polyethylene glycol  17 g Oral BID  . potassium chloride  20 mEq Oral BID  . predniSONE  10 mg Oral Q breakfast  . pregabalin  100 mg Oral TID  . saccharomyces boulardii  250 mg Oral BID  . tamsulosin  0.4 mg Oral QPC supper  . vitamin C  250 mg Oral TID  . zinc sulfate  220 mg Oral Daily   Continuous Infusions: . dextrose 5 % and 0.45% NaCl 70 mL/hr at 04/26/14 1660    Principal Problem:   Sepsis(995.91) Active Problems:   Physical deconditioning   HTN (hypertension)   Bacteremia   Pneumonia, organism unspecified   VRE (vancomycin-resistant Enterococci) infection   PVD (peripheral vascular disease)   Fever   Paroxysmal supraventricular tachycardia   Time spent:   Tzipora Mcinroy K  [facility] Pager [phone]. If 7PM-7AM, please contact night-coverage at [email or web address], password [facility] 04/26/2014, 10:24 AM  LOS: 5 days

## 2014-04-27 ENCOUNTER — Encounter (HOSPITAL_COMMUNITY): Payer: Self-pay

## 2014-04-27 ENCOUNTER — Inpatient Hospital Stay (HOSPITAL_COMMUNITY): Payer: BLUE CROSS/BLUE SHIELD | Admitting: Occupational Therapy

## 2014-04-27 ENCOUNTER — Inpatient Hospital Stay (HOSPITAL_COMMUNITY): Payer: BLUE CROSS/BLUE SHIELD | Admitting: Physical Therapy

## 2014-04-27 ENCOUNTER — Inpatient Hospital Stay (HOSPITAL_COMMUNITY): Payer: Medicare Other

## 2014-04-27 ENCOUNTER — Inpatient Hospital Stay (HOSPITAL_COMMUNITY): Payer: Medicare Other | Admitting: *Deleted

## 2014-04-27 ENCOUNTER — Encounter (HOSPITAL_COMMUNITY): Payer: BLUE CROSS/BLUE SHIELD | Admitting: Occupational Therapy

## 2014-04-27 DIAGNOSIS — I059 Rheumatic mitral valve disease, unspecified: Secondary | ICD-10-CM

## 2014-04-27 DIAGNOSIS — I471 Supraventricular tachycardia: Secondary | ICD-10-CM

## 2014-04-27 DIAGNOSIS — I739 Peripheral vascular disease, unspecified: Secondary | ICD-10-CM

## 2014-04-27 DIAGNOSIS — I82409 Acute embolism and thrombosis of unspecified deep veins of unspecified lower extremity: Secondary | ICD-10-CM

## 2014-04-27 DIAGNOSIS — R5381 Other malaise: Secondary | ICD-10-CM

## 2014-04-27 LAB — BASIC METABOLIC PANEL
BUN: 16 mg/dL (ref 6–23)
BUN: 18 mg/dL (ref 6–23)
CO2: 30 mEq/L (ref 19–32)
CO2: 31 mEq/L (ref 19–32)
CREATININE: 0.77 mg/dL (ref 0.50–1.35)
Calcium: 8 mg/dL — ABNORMAL LOW (ref 8.4–10.5)
Calcium: 8.8 mg/dL (ref 8.4–10.5)
Chloride: 104 mEq/L (ref 96–112)
Chloride: 98 mEq/L (ref 96–112)
Creatinine, Ser: 0.63 mg/dL (ref 0.50–1.35)
GFR calc Af Amer: >90 mL/min (ref >90)
GFR calc non Af Amer: 89 mL/min — ABNORMAL LOW (ref >90)
GFR calc non Af Amer: >90 mL/min (ref >90)
GLUCOSE: 117 mg/dL — AB (ref 70–99)
Glucose, Bld: 93 mg/dL (ref 70–99)
POTASSIUM: 3.6 meq/L — AB (ref 3.7–5.3)
Potassium: 3.9 mEq/L (ref 3.7–5.3)
Sodium: 142 mEq/L (ref 137–147)
Sodium: 144 mEq/L (ref 137–147)

## 2014-04-27 LAB — PROCALCITONIN: Procalcitonin: 0.11 ng/mL

## 2014-04-27 LAB — CBC
HCT: 30.3 % — ABNORMAL LOW (ref 39.0–52.0)
HEMATOCRIT: 27 % — AB (ref 39.0–52.0)
HEMOGLOBIN: 8.2 g/dL — AB (ref 13.0–17.0)
Hemoglobin: 9.3 g/dL — ABNORMAL LOW (ref 13.0–17.0)
MCH: 26.5 pg (ref 26.0–34.0)
MCH: 26.6 pg (ref 26.0–34.0)
MCHC: 30.4 g/dL (ref 30.0–36.0)
MCHC: 30.7 g/dL (ref 30.0–36.0)
MCV: 86.8 fL (ref 78.0–100.0)
MCV: 87.1 fL (ref 78.0–100.0)
PLATELETS: 155 10*3/uL (ref 150–400)
Platelets: 165 10*3/uL (ref 150–400)
RBC: 3.1 MIL/uL — ABNORMAL LOW (ref 4.22–5.81)
RBC: 3.49 MIL/uL — ABNORMAL LOW (ref 4.22–5.81)
RDW: 16.5 % — ABNORMAL HIGH (ref 11.5–15.5)
RDW: 16.6 % — AB (ref 11.5–15.5)
WBC: 7.5 10*3/uL (ref 4.0–10.5)
WBC: 7.2 10*3/uL (ref 4.0–10.5)

## 2014-04-27 LAB — GLUCOSE, CAPILLARY
GLUCOSE-CAPILLARY: 132 mg/dL — AB (ref 70–99)
GLUCOSE-CAPILLARY: 232 mg/dL — AB (ref 70–99)
GLUCOSE-CAPILLARY: 80 mg/dL (ref 70–99)
Glucose-Capillary: 116 mg/dL — ABNORMAL HIGH (ref 70–99)
Glucose-Capillary: 120 mg/dL — ABNORMAL HIGH (ref 70–99)
Glucose-Capillary: 106 mg/dL — ABNORMAL HIGH (ref 70–99)

## 2014-04-27 LAB — PHOSPHORUS
Phosphorus: 3.3 mg/dL (ref 2.3–4.6)
Phosphorus: 3.8 mg/dL (ref 2.3–4.6)

## 2014-04-27 LAB — TROPONIN I
Troponin I: <0.3 ng/mL (ref <0.30)
Troponin I: <0.3 ng/mL (ref <0.30)
Troponin I: <0.3 ng/mL (ref <0.30)

## 2014-04-27 LAB — LACTIC ACID, PLASMA
Lactic Acid, Venous: 0.7 mmol/L (ref 0.5–2.2)
Lactic Acid, Venous: 1.6 mmol/L (ref 0.5–2.2)

## 2014-04-27 LAB — MAGNESIUM
MAGNESIUM: 2.3 mg/dL (ref 1.5–2.5)
Magnesium: 2.2 mg/dL (ref 1.5–2.5)

## 2014-04-27 LAB — HEPARIN LEVEL (UNFRACTIONATED)
Heparin Unfractionated: 0.39 IU/mL (ref 0.30–0.70)
Heparin Unfractionated: 0.36 IU/mL (ref 0.30–0.70)

## 2014-04-27 LAB — CORTISOL: Cortisol, Plasma: 53.7 ug/dL

## 2014-04-27 MED ORDER — IOHEXOL 350 MG/ML SOLN
80.0000 mL | Freq: Once | INTRAVENOUS | Status: AC | PRN
  Administered 2014-04-27: 80 mL via INTRAVENOUS

## 2014-04-27 MED ORDER — CHLORHEXIDINE GLUCONATE 0.12 % MT SOLN
15.0000 mL | Freq: Two times a day (BID) | OROMUCOSAL | Status: DC
  Administered 2014-04-27 – 2014-04-28 (×4): 15 mL via OROMUCOSAL
  Filled 2014-04-27 (×4): qty 15

## 2014-04-27 MED ORDER — ASPIRIN EC 81 MG PO TBEC
81.0000 mg | DELAYED_RELEASE_TABLET | Freq: Every day | ORAL | Status: DC
  Administered 2014-04-27 – 2014-05-12 (×16): 81 mg via ORAL
  Filled 2014-04-27 (×16): qty 1

## 2014-04-27 MED ORDER — SODIUM CHLORIDE 0.9 % IJ SOLN
10.0000 mL | INTRAMUSCULAR | Status: DC | PRN
  Administered 2014-04-30 – 2014-05-01 (×2): 10 mL

## 2014-04-27 MED ORDER — PANTOPRAZOLE SODIUM 40 MG PO TBEC
40.0000 mg | DELAYED_RELEASE_TABLET | Freq: Every day | ORAL | Status: DC
  Administered 2014-04-27 – 2014-05-12 (×16): 40 mg via ORAL
  Filled 2014-04-27 (×15): qty 1

## 2014-04-27 MED ORDER — VITAMIN C 250 MG PO TABS
250.0000 mg | ORAL_TABLET | Freq: Three times a day (TID) | ORAL | Status: DC
  Administered 2014-04-27 – 2014-05-12 (×40): 250 mg via ORAL
  Filled 2014-04-27 (×50): qty 1

## 2014-04-27 MED ORDER — INSULIN ASPART 100 UNIT/ML ~~LOC~~ SOLN
8.0000 [IU] | Freq: Once | SUBCUTANEOUS | Status: AC
  Administered 2014-04-27: 8 [IU] via SUBCUTANEOUS

## 2014-04-27 MED ORDER — SODIUM CHLORIDE 0.9 % IV BOLUS (SEPSIS)
500.0000 mL | Freq: Once | INTRAVENOUS | Status: AC
  Administered 2014-04-27: 500 mL via INTRAVENOUS

## 2014-04-27 MED ORDER — ZINC SULFATE 220 (50 ZN) MG PO CAPS
220.0000 mg | ORAL_CAPSULE | Freq: Every day | ORAL | Status: DC
  Administered 2014-04-27 – 2014-05-04 (×8): 220 mg via ORAL
  Filled 2014-04-27 (×8): qty 1

## 2014-04-27 MED ORDER — HEPARIN (PORCINE) IN NACL 100-0.45 UNIT/ML-% IJ SOLN
1600.0000 [IU]/h | INTRAMUSCULAR | Status: AC
  Administered 2014-04-27: 1200 [IU]/h via INTRAVENOUS
  Administered 2014-04-28 – 2014-05-01 (×5): 1350 [IU]/h via INTRAVENOUS
  Administered 2014-05-02: 1550 [IU]/h via INTRAVENOUS
  Administered 2014-05-02: 1450 [IU]/h via INTRAVENOUS
  Administered 2014-05-02: 1350 [IU]/h via INTRAVENOUS
  Administered 2014-05-02 – 2014-05-04 (×3): 1550 [IU]/h via INTRAVENOUS
  Administered 2014-05-04 – 2014-05-06 (×4): 1600 [IU]/h via INTRAVENOUS
  Filled 2014-04-27 (×18): qty 250

## 2014-04-27 MED ORDER — BACID PO TABS
1.0000 | ORAL_TABLET | Freq: Two times a day (BID) | ORAL | Status: DC
  Administered 2014-04-27 – 2014-05-12 (×30): 1 via ORAL
  Filled 2014-04-27 (×38): qty 1

## 2014-04-27 MED ORDER — ACETAMINOPHEN 325 MG PO TABS
650.0000 mg | ORAL_TABLET | Freq: Four times a day (QID) | ORAL | Status: DC | PRN
  Administered 2014-04-28 – 2014-05-08 (×13): 650 mg via ORAL
  Filled 2014-04-27 (×14): qty 2

## 2014-04-27 MED ORDER — SODIUM CHLORIDE 0.9 % IJ SOLN
10.0000 mL | Freq: Two times a day (BID) | INTRAMUSCULAR | Status: DC
  Administered 2014-04-27 – 2014-05-08 (×6): 10 mL
  Administered 2014-05-09: 20 mL
  Administered 2014-05-10: 10 mL

## 2014-04-27 MED ORDER — BIOTENE DRY MOUTH MT LIQD
15.0000 mL | Freq: Two times a day (BID) | OROMUCOSAL | Status: DC
  Administered 2014-04-27 – 2014-05-12 (×29): 15 mL via OROMUCOSAL

## 2014-04-27 MED ORDER — ADULT MULTIVITAMIN W/MINERALS CH
1.0000 | ORAL_TABLET | Freq: Every day | ORAL | Status: DC
  Administered 2014-04-27 – 2014-05-10 (×14): 1 via ORAL
  Filled 2014-04-27 (×15): qty 1

## 2014-04-27 MED ORDER — THERA M PLUS PO TABS: 1.0000 | ORAL_TABLET | Freq: Every day | ORAL | Status: DC

## 2014-04-27 MED ORDER — INSULIN ASPART 100 UNIT/ML ~~LOC~~ SOLN
0.0000 [IU] | Freq: Three times a day (TID) | SUBCUTANEOUS | Status: DC
  Administered 2014-04-27: 2 [IU] via SUBCUTANEOUS
  Administered 2014-04-28: 3 [IU] via SUBCUTANEOUS
  Administered 2014-04-28 (×2): 2 [IU] via SUBCUTANEOUS
  Administered 2014-04-29: 3 [IU] via SUBCUTANEOUS
  Administered 2014-04-29 – 2014-05-03 (×5): 2 [IU] via SUBCUTANEOUS
  Administered 2014-05-05: 3 [IU] via SUBCUTANEOUS
  Administered 2014-05-06: 2 [IU] via SUBCUTANEOUS
  Administered 2014-05-06: 3 [IU] via SUBCUTANEOUS
  Administered 2014-05-07: 2 [IU] via SUBCUTANEOUS

## 2014-04-27 MED ORDER — PHENYLEPHRINE HCL 10 MG/ML IJ SOLN
30.0000 ug/min | INTRAMUSCULAR | Status: DC
  Administered 2014-04-27: 20 ug/min via INTRAVENOUS
  Administered 2014-04-27: 30 ug/min via INTRAVENOUS
  Administered 2014-04-28: 22 ug/min via INTRAVENOUS
  Administered 2014-04-28: 10 ug/min via INTRAVENOUS
  Filled 2014-04-27 (×4): qty 1

## 2014-04-27 MED ORDER — TAMSULOSIN HCL 0.4 MG PO CAPS
0.4000 mg | ORAL_CAPSULE | Freq: Every day | ORAL | Status: DC
  Administered 2014-04-27 – 2014-05-12 (×16): 0.4 mg via ORAL
  Filled 2014-04-27 (×16): qty 1

## 2014-04-27 MED ORDER — RESOURCE THICKENUP CLEAR PO POWD
ORAL | Status: DC | PRN
  Filled 2014-04-27 (×2): qty 125

## 2014-04-27 MED ORDER — SENNA 8.6 MG PO TABS
1.0000 | ORAL_TABLET | Freq: Every day | ORAL | Status: DC
  Administered 2014-04-27 – 2014-05-11 (×10): 8.6 mg via ORAL
  Filled 2014-04-27 (×16): qty 1

## 2014-04-27 MED ORDER — IPRATROPIUM-ALBUTEROL 0.5-2.5 (3) MG/3ML IN SOLN
3.0000 mL | RESPIRATORY_TRACT | Status: DC | PRN
  Administered 2014-04-27 – 2014-04-29 (×3): 3 mL via RESPIRATORY_TRACT
  Filled 2014-04-27 (×4): qty 3

## 2014-04-27 MED ORDER — HEPARIN BOLUS VIA INFUSION
3000.0000 [IU] | Freq: Once | INTRAVENOUS | Status: AC
  Administered 2014-04-27: 3000 [IU] via INTRAVENOUS
  Filled 2014-04-27: qty 3000

## 2014-04-27 MED ORDER — FOLIC ACID 1 MG PO TABS
1.0000 mg | ORAL_TABLET | Freq: Every day | ORAL | Status: DC
  Administered 2014-04-27 – 2014-05-12 (×16): 1 mg via ORAL
  Filled 2014-04-27 (×16): qty 1

## 2014-04-27 MED FILL — Medication: Qty: 1 | Status: AC

## 2014-04-27 NOTE — Progress Notes (Signed)
PULMONARY / CRITICAL CARE MEDICINE   Name: Laurie PandaRonald L Grizzle MRN: [id number] DOB: 04/13/1941    ADMISSION DATE:  04/26/2014 CONSULTATION DATE:  04/26/2014  REFERRING MD :  EDP  CHIEF COMPLAINT:  Hypotension, AMS  BRIEF PATIENT DESCRIPTION:  73 y.o. M who underwent back surgery 01/04/14, complicated by hardware infection with MRSA, VRE, and proteus bacteremia.  [facility].  Hardware [facility]  Course further complicated by Klebsiella PNA on sputum culture.  On 6/10, was transferred to CIR then 6/15 developed new fever, altered mental status, lethargy.  PCCM was consulted.  SIGNIFICANT EVENTS: 2/23 - back surgery. 4/29 - d/c'd to SNF. [facility] 6/10 - transferred to CIR. 6/15 - developed fever to 102, tachycardia, hypoxia, AMS, lethargy.  STUDIES:  6/10 Doppler legs >> partial acute DVT Rt common femoral vein  LINES / TUBES: Right PICC >>>  CULTURES: Blood 4/1 >>> neg Blood 5/26 >>> neg Blood ? Date >>> POS MRSA, VRE, PROTEUS. Sputum ? Date >>> POS KLEBSIELLA. Blood 6/15 >>> Urine 6/15 >>>  ANTIBIOTICS: Cefepime ? >>> 5/15  Daptomycin >>> (tentatively through 6/23 per ID) Meropenem >>> (tentatively through 6/23 per ID)  SUBJECTIVE:  On low dose phenylephrine.  Feels hungry/thirsty.  VITAL SIGNS: Temp:  [97.3 F (36.3 C)-102 F (38.9 C)] 97.5 F (36.4 C) (06/16 0752) Pulse Rate:  [30-130] 89 (06/16 0700) Resp:  [13-23] 23 (06/16 0700) BP: (71-132)/(42-82) 94/68 mmHg (06/16 0700) SpO2:  [81 %-100 %] 100 % (06/16 0700) Weight:  [173 lb 8 oz (78.7 kg)-187 lb 8 oz (85.049 kg)] 187 lb 8 oz (85.049 kg) (06/16 0500) INTAKE / OUTPUT: Intake/Output     06/15 0701 - 06/16 0700 06/16 0701 - 06/17 0700   I.V. (mL/kg) 775 (9.1)    IV Piggyback 2700    Total Intake(mL/kg) 3475 (40.9)    Urine (mL/kg/hr) 275    Total Output 275     Net +3200          Urine  Occurrence 2 x    Stool Occurrence 3 x      PHYSICAL EXAMINATION: General: no distress Neuro: alert, follows commands, moves upper extremities HEENT: no sinus tenderness Cardiovascular: irregular, 2/6 SM Lungs: decreased breath sounds at bases, no wheeze Abdomen: soft, non tender Musculoskeletal: no edema Skin: no rashes  LABS: PULMONARY  Recent Labs Lab 04/26/14 2135  PHART 7.433  PCO2ART 48.6*  PO2ART 134.0*  HCO3 32.5*  TCO2 34  O2SAT 99.0    CBC  Recent Labs Lab 04/25/14 1805 04/26/14 2345 04/27/14 0500  HGB 9.2* 9.3* 8.2*  HCT 29.3* 30.3* 27.0*  WBC 6.9 7.2 7.5  PLT 200 155 165    COAGULATION No results found for this basename: INR,  in the last 168 hours  CHEMISTRY  Recent Labs Lab 04/22/14 0610 04/25/14 1805 04/26/14 2345 04/27/14 0500  NA 141 138 142 144  K 3.5* 4.1 3.9 3.6*  CL 97 96 98 104  CO2 29 28 31 30   GLUCOSE 86 174* 93 117*  BUN 20 18 18 16   CREATININE 0.68 0.75 0.77 0.63  CALCIUM 9.1 9.4 8.8 8.0*  MG  --   --  2.3 2.2  PHOS  --   --  3.8 3.3   Estimated Creatinine Clearance: 89.8 ml/min (by C-G formula based on Cr of 0.63).   LIVER  Recent Labs Lab  04/22/14 0610 04/25/14 1805  AST 25 26  ALT 14 14  ALKPHOS 148* 152*  BILITOT 0.8 0.8  PROT 6.6 6.7  ALBUMIN 2.7* 2.7*     INFECTIOUS  Recent Labs Lab 04/26/14 2345 04/27/14 04/27/14 0500  LATICACIDVEN  --  1.6 0.7  PROCALCITON 0.11  --  <0.10     ENDOCRINE CBG (last 3)   Recent Labs  04/26/14 2356 04/27/14 0405 04/27/14 0755  GLUCAP 80 116* 120*    IMAGING x48h  Dg Chest 2 View  04/26/2014   CLINICAL DATA:  Hypoxia.  Question CHF  EXAM: CHEST  2 VIEW  COMPARISON:  04/25/2014  FINDINGS: Right arm PICC tip in the SVC unchanged.  Diffusely increased lung markings unchanged from the prior study. Question mild CHF versus chronic lung disease. No significant effusion.  IMPRESSION: Prominent lung markings are stable and may be due to chronic CHF or chronic  lung disease and fibrosis. No superimposed edema or effusion.   Electronically Signed   By: Marlan Palau M.D.   On: 04/26/2014 17:15   Dg Chest 2 View  04/25/2014   CLINICAL DATA:  Short of breath.  Fever.  Back pain.  EXAM: CHEST  2 VIEW  COMPARISON:  04/22/2014.  FINDINGS: Right upper extremity PICC is unchanged. The cardiopericardial silhouette remains enlarged. Bilateral basilar predominant airspace disease with diffuse interstitial prominence. Compared to the prior exam, there is no interval change aside from lower lung volumes. No pleural effusion.  IMPRESSION: 1. Unchanged right upper extremity PICC with the tip in the mid SVC. 2. Lower lung volumes with interstitial and alveolar basilar opacities.   Electronically Signed   By: Andreas Newport M.D.   On: 04/25/2014 19:27   Dg Chest Port 1 View  04/27/2014   CLINICAL DATA:  Followup prominent lung markings  EXAM: PORTABLE CHEST - 1 VIEW  COMPARISON:  Portable exam 0620 hr compared to 04/26/2014  FINDINGS: RIGHT arm PICC line tip projects over SVC.  Rotated to the RIGHT.  Enlargement of cardiac silhouette.  Mediastinal contours and pulmonary vascularity normal.  Persistent accentuation of interstitial markings similar to previous exam.  Minimal infiltrate or atelectasis at LEFT base.  Lungs otherwise clear.  No pleural effusion or pneumothorax.  Chronic RIGHT rotator cuff tear.  IMPRESSION: Mild enlargement of cardiac silhouette.  Chronic accentuation of interstitial markings.  Minimal atelectasis versus infiltrate at LEFT base.   Electronically Signed   By: Ulyses Southward M.D.   On: 04/27/2014 08:14      ASSESSMENT / PLAN:  PULMONARY A: Atelectasis. Reported hx of COPD. P:   Bronchial hygiene F/u CXR as needed Oxygen to keep SpO2 > 92%  CARDIOVASCULAR A:  Hypotension - appears hypovolemic on exam. Rt leg DVT. P:  Continue IV fluids Heparin gtt per pharmacy ASA Hold lasix, plavix Check Echo F/u ECG  RENAL A:  Hypokalemia. P:    F/u and replace electrolytes as needed Hold flomax  GASTROINTESTINAL A:   Nutrition. GERD. P:   Advance diet as tolerated Continue protonix  HEMATOLOGIC A:  Mild anemia. P:  F/u CBC Transfuse for Hb < 7  INFECTIOUS A:   MRSA, VRE, Proteus Bacteremia. P:   F/u cultures Continue Daptomycin / Meropenem (per chart review, continue through 6/23).  ENDOCRINE A:   Hyperglycemia. P:   SSI  NEUROLOGIC A:   Acute encephalopathy >> improved 6/16. P:   Hold remeron, oxycodone, lyrica  RHEUMATOLOGY A: Hx of Rheumatoid arthritis on chronic prednisone. P: Stress  dose steroids for now  CC time 35 minutes.  Coralyn Helling, MD [facility] Pulmonary/Critical Care 04/27/2014, 8:31 AM Pager:  [phone] After 3pm call: [phone]

## 2014-04-27 NOTE — Progress Notes (Signed)
Patient retaining urine. Bladder scan revealed 473 cc of urine in bladder. Patient can not void and does have a history of retention issues. Dr. Craige Cotta ordered to place foley.

## 2014-04-27 NOTE — H&P (Signed)
04/26/2014 the nurse tech alerted me that the pts vitals were 78/54, pulse 81 and 02 81. Pt was not responding to verbal stimulation and HR periodically dropping down to 35, then would increase to 80-90's. I called RRT, spoke with Zyad Boomer, she informed me to give the pt 250 bolus NS and increase O2 at 100%. RRT took over pt care and pt was D/C from rehab floor.

## 2014-04-27 NOTE — Progress Notes (Signed)
[facility] MD notified of persistent low BP. Instructed to administer 400 mL bolus of NS. SBP remained in low 80s. Instructed to start Neo and titrate to a MAP of 65.  Will continue to monitor patient carefully.  Devona Konig, RN.

## 2014-04-27 NOTE — Progress Notes (Signed)
ANTICOAGULATION CONSULT NOTE - Initial Consult  Pharmacy Consult for Heparin Indication: DVT  No Known Allergies  Patient Measurements: Height: 5\' 8"  (172.7 cm) Weight: 187 lb 8 oz (85.049 kg) IBW/kg (Calculated) : 68.4 Heparin Dosing Weight: 85 kg  Vital Signs: Temp: 97.5 F (36.4 C) (06/16 0752) Temp src: Oral (06/16 0752) BP: 122/83 mmHg (06/16 0800) Pulse Rate: 91 (06/16 0800)  Labs:  Recent Labs  04/25/14 1805 04/26/14 2345 04/27/14 0500  HGB 9.2* 9.3* 8.2*  HCT 29.3* 30.3* 27.0*  PLT 200 155 165  CREATININE 0.75 0.77 0.63  CKTOTAL 39  --   --     Estimated Creatinine Clearance: 89.8 ml/min (by C-G formula based on Cr of 0.63).   Medical History: Past Medical History  Diagnosis Date  . Hypertension   . Heart murmur   . Peripheral vascular disease   . Arthritis   . Hepatitis 1954    Medications:  Scheduled:  . antiseptic oral rinse  15 mL Mouth Rinse q12n4p  . aspirin  81 mg Oral Daily  . chlorhexidine  15 mL Mouth Rinse BID  . DAPTOmycin (CUBICIN)  IV  500 mg Intravenous Q24H  . folic acid  1 mg Oral Daily  . hydrocortisone sodium succinate  50 mg Intravenous Q6H  . insulin aspart  0-15 Units Subcutaneous TID WC  . lactobacillus acidophilus  1 tablet Oral BID  . meropenem (MERREM) IV  1 g Intravenous 3 times per day  . multivitamin with minerals  1 tablet Oral Daily  . pantoprazole  40 mg Oral Q1200  . senna  1 tablet Oral QHS  . sodium chloride  500 mL Intravenous Once  . tamsulosin  0.4 mg Oral Daily  . vitamin C  250 mg Oral TID WC  . zinc sulfate  220 mg Oral Daily    Assessment: 73 yo male admitted with hypotension and AMS.  Underwent back surgery 01/04/14, complicated by hardware infection.  On 6/10 was transferred to CIR, then developed new fever an AMS, lethargy and transferred back to inpatient status.  Dopplers of R leg reveals partial acute DVT of rt. Common femoral vein.  Pharmacy asked to begin anticoagulation with IV heparin.  Hgb  low at baseline, Pltc WNL.  Goal of Therapy:  Heparin level 0.3-0.7 units/ml Monitor platelets by anticoagulation protocol: Yes   Plan:  1. Start IV heparin with small bolus of 3000 units x 1. (reduced due to low Hgb) 2. Start heparin drip at 1200 units/hr. 3. Check heparin level 6 hrs after gtt starts. 4. Daily heparin level and CBC.  Will watch CBC closely on IV heparin. 5. F/u plans for oral anticoagulation?  8/10, BCPS  Clinical Pharmacist Pager [phone]  04/27/2014 9:30 AM

## 2014-04-27 NOTE — Progress Notes (Signed)
ANTICOAGULATION CONSULT NOTE - Follow Up Consult  Pharmacy Consult  :  Heparin Indication  :  DVT  Dosing Weight: 85 kg   Recent Labs  04/25/14 1805 04/26/14 2345 04/27/14 0500 04/27/14 1550 04/27/14 2130  HGB 9.2* 9.3* 8.2*  --   --   HCT 29.3* 30.3* 27.0*  --   --   PLT 200 155 165  --   --   HEPARINUNFRC  --   --   --  0.39 0.36  CREATININE 0.75 0.77 0.63  --   --      Medications: Scheduled:  . antiseptic oral rinse  15 mL Mouth Rinse q12n4p  . aspirin EC  81 mg Oral Daily  . chlorhexidine  15 mL Mouth Rinse BID  . DAPTOmycin (CUBICIN)  IV  500 mg Intravenous Q24H  . folic acid  1 mg Oral Daily  . hydrocortisone sodium succinate  50 mg Intravenous Q6H  . insulin aspart  0-15 Units Subcutaneous TID WC  . lactobacillus acidophilus  1 tablet Oral BID  . meropenem (MERREM) IV  1 g Intravenous 3 times per day  . multivitamin with minerals  1 tablet Oral Daily  . pantoprazole  40 mg Oral Q1200  . senna  1 tablet Oral QHS  . sodium chloride  10-40 mL Intracatheter Q12H  . tamsulosin  0.4 mg Oral Daily  . vitamin C  250 mg Oral TID WC  . zinc sulfate  220 mg Oral Daily    Infusions:  . sodium chloride 75 mL/hr at 04/27/14 2000  . heparin 1,200 Units/hr (04/27/14 2000)  . phenylephrine (NEO-SYNEPHRINE) Adult infusion 5 mcg/min (04/27/14 2000)    Assessment:  73 y/o male on Heparin infusion for treatment of partially acute DVT.    Repeat 6 hr Heparin level 0.36 units/ml.  No evidence of bleeding complications noted   Goal:  Heparin Level  0.3 - 0.7 units/ml   Plan: 1. Continue Heparin infusion at 1200 units/hr. 2. Daily Heparin Levels, Platelet counts, CBC.  Monitor for bleeding complications    Jon Lozano,  Pharm.D  04/27/2014, 10:22 PM

## 2014-04-27 NOTE — Consult Note (Signed)
WOC wound consult note  Reason for Consult: Consult requested for back wound. Pt had surgery to his back this spring at another hospital. After course of negative pressure wound therapy, wounds had not improved.  Consulted today for topical wound therapy.  Wife indicates would eventually like to go back to [facility] therapy if wound is appropriate.  Wound type: 3 areas of full thickness wounds over previous middle back surgical site where wound dehisced. Wounds communicate underneath the surface. Spinal bone palpable when probed with swab.  Measurement:4X4X1cm and .2X.2X1cm. Tunneling below lower wound is located at 6:00 o'clock to 8 cm depth from top wound to below bottom of lower wound. Wounds have 1 cm undermining to edges.  Another area of dehiscence is located further down the spine, measures 0.1 cm x 0.1 cm x 4 cm. There are multiple blistering ulcerations present to legs (8 on each calf) and wife states he has gotten these lesions for years. Each are covered with silicone bordered foam due to weeping and to promote healing.  Wound bed: 90% yellow slough covering wound bed, 10% red on top 2 spinal lesions.  Unable to visualize wound bed on the lowest opening, but yellow slough removed with measurement and assessment.   Drainage (amount, consistency, odor) Mod amt yellow drainage, no odor noted.  Periwound: intact  Dressing procedure/placement/frequency:  Bilateral legs:  Cleanse open lesions with NS and pat gently dry.  Apply silicone bordered foam dressing to nonintact blistering areas.  Change every 5 days and PRN soilage.   Back wounds:  Cleanse with NS and pat gently dry.  Apply Santyl to slough in wound bed, 1/8 inch thickness (opaque).  Fill in dead space of wound with NS moistened kerlix.  Pack top two wounds with one piece of kerlix.  Change daily.  Bedside nurse to change dressings at this time.  Will not follow at this time.  Please re-consult if needed.  Maple Hudson RN BSN CWON Pager  [phone]

## 2014-04-27 NOTE — Progress Notes (Signed)
eLink Physician-Brief Progress Note Patient Name: Jon Lozano DOB: Oct 14, 1941 MRN: [id number]  Date of Service  04/27/2014   HPI/Events of Note   Presumed septic shock, normal lactic acid Now with low BP again  CXR > pulm edema?  Breathing comfortably on 3L [place]  eICU Interventions  Bolus 500cc again, but if no improvement will start pressors   Intervention Category Major Interventions: Shock - evaluation and management  Talyn Eddie 04/27/2014, 1:16 AM

## 2014-04-27 NOTE — Progress Notes (Signed)
eLink Physician-Brief Progress Note Patient Name: Jon Lozano DOB: [birth year] MRN: [id number]  Date of Service  04/27/2014   HPI/Events of Note   Hypotension persists despite fluid resuscitation Does not appear toxic Afib, rate controlled  eICU Interventions  Start neo gtt titrated to MAP > 65 Repeat lactate in AM   Intervention Category Major Interventions: Hypotension - evaluation and management  MCQUAID, DOUGLAS 04/27/2014, 2:31 AM

## 2014-04-27 NOTE — Progress Notes (Signed)
ANTIBIOTIC CONSULT NOTE - FOLLOW UP  Pharmacy Consult for Daptomycin + Meropenem Indication: Bacteremia/ hardware infection  No Known Allergies  Patient Measurements: Height: 5\' 8"  (172.7 cm) Weight: 187 lb 8 oz (85.049 kg) IBW/kg (Calculated) : 68.4  Vital Signs: Temp: 97.5 F (36.4 C) (06/16 0752) Temp src: Oral (06/16 0752) BP: 132/89 mmHg (06/16 0900) Pulse Rate: 106 (06/16 0900) Intake/Output from previous day: 06/15 0701 - 06/16 0700 In: 3475 [I.V.:775; IV Piggyback:2700] Out: 275 [Urine:275] Intake/Output from this shift: Total I/O In: 223.1 [I.V.:223.1] Out: -   Labs:  Recent Labs  04/25/14 1805 04/26/14 2345 04/27/14 0500  WBC 6.9 7.2 7.5  HGB 9.2* 9.3* 8.2*  PLT 200 155 165  CREATININE 0.75 0.77 0.63   Estimated Creatinine Clearance: 89.8 ml/min (by C-G formula based on Cr of 0.63). No results found for this basename: VANCOTROUGH, 04/29/14, VANCORANDOM, GENTTROUGH, GENTPEAK, GENTRANDOM, TOBRATROUGH, TOBRAPEAK, TOBRARND, AMIKACINPEAK, AMIKACINTROU, AMIKACIN,  in the last 72 hours   Microbiology: Recent Results (from the past 720 hour(s))  CULTURE, BLOOD (ROUTINE X 2)     Status: None   Collection Time    04/06/14  3:30 PM      Result Value Ref Range Status   Specimen Description BLOOD LEFT ARM   Final   Special Requests BOTTLES DRAWN AEROBIC ONLY 3CC   Final   Culture  Setup Time     Final   Value: 04/06/2014 20:05     Performed at 04/08/2014   Culture     Final   Value: NO GROWTH 5 DAYS     Performed at [facility]   Report Status 04/12/2014 FINAL   Final  CULTURE, BLOOD (ROUTINE X 2)     Status: None   Collection Time    04/06/14  3:37 PM      Result Value Ref Range Status   Specimen Description BLOOD LEFT ARM   Final   Special Requests BOTTLES DRAWN AEROBIC ONLY 3CC   Final   Culture  Setup Time     Final   Value: 04/06/2014 20:05     Performed at 04/08/2014   Culture     Final   Value: NO GROWTH 5 DAYS      Performed at [facility]   Report Status 04/12/2014 FINAL   Final  MRSA PCR SCREENING     Status: None   Collection Time    04/22/14  9:44 AM      Result Value Ref Range Status   MRSA by PCR NEGATIVE  NEGATIVE Final   Comment:            The GeneXpert MRSA Assay (FDA     approved for NASAL specimens     only), is one component of a     comprehensive MRSA colonization     surveillance program. It is not     intended to diagnose MRSA     infection nor to guide or     monitor treatment for     MRSA infections.  CULTURE, BLOOD (ROUTINE X 2)     Status: None   Collection Time    04/25/14  6:05 PM      Result Value Ref Range Status   Specimen Description BLOOD LEFT ARM   Final   Special Requests BOTTLES DRAWN AEROBIC ONLY 10CC   Final   Culture  Setup Time     Final   Value: 04/26/2014 00:46  Performed at [facility]     Final   Value:        BLOOD CULTURE RECEIVED NO GROWTH TO DATE CULTURE WILL BE HELD FOR 5 DAYS BEFORE ISSUING A FINAL NEGATIVE REPORT     Performed at [facility]   Report Status PENDING   Incomplete  URINE CULTURE     Status: None   Collection Time    04/25/14  6:12 PM      Result Value Ref Range Status   Specimen Description URINE, CLEAN CATCH   Final   Special Requests NONE   Final   Culture  Setup Time     Final   Value: 04/25/2014 18:51     Performed at [facility]     Final   Value: NO GROWTH     Performed at [facility]   Culture     Final   Value: NO GROWTH     Performed at [facility]   Report Status 04/26/2014 FINAL   Final  CULTURE, BLOOD (ROUTINE X 2)     Status: None   Collection Time    04/25/14  6:12 PM      Result Value Ref Range Status   Specimen Description BLOOD LEFT HAND   Final   Special Requests BOTTLES DRAWN AEROBIC ONLY 10CC   Final   Culture  Setup Time     Final   Value: 04/26/2014 00:46     Performed at [facility]   Culture     Final    Value:        BLOOD CULTURE RECEIVED NO GROWTH TO DATE CULTURE WILL BE HELD FOR 5 DAYS BEFORE ISSUING A FINAL NEGATIVE REPORT     Performed at [facility]   Report Status PENDING   Incomplete    Anti-infectives   Start     Dose/Rate Route Frequency Ordered Stop   04/27/14 1200  DAPTOmycin (CUBICIN) 500 mg in sodium chloride 0.9 % IVPB     500 mg 220 mL/hr over 30 Minutes Intravenous Every 24 hours 04/26/14 2227     04/26/14 2230  meropenem (MERREM) 1 g in sodium chloride 0.9 % 100 mL IVPB     1 g 200 mL/hr over 30 Minutes Intravenous 3 times per day 04/26/14 2227        Assessment: Abx continuation from [facility] 73 y/o M underwent back surgery 01/04/14 complicated by 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 At SNF he developed purulent drainage on 03/10/14 with wound cultures positive for VRE. Hardware was removed by Dr. Alger Memos and [facility] placed with recommendations for daptomycin/cefepime for 8 weeks of antibiotic therapy. Cefepime changed to Merrem on 03/26/14 for Kleb. Pneumonia in sputum. Back wound continues to have copious drainage with areas of dehiscence. Plan to con't abx through 05/04/14. The patient's last CK on 6/14 was 39, will plan to check weekly CK levels while on Dapto. Renal function stable - doses appropriate.  Cubicin 03/10/14>> Cefepime 4/29>>5/15 Merrem 5/15>>  Last doses at [facility]:  -Cubicin 500mg  daily (missed dose 6/9?), dose 0800 6/10 -Merrem: last dose 6/10 1400  Goal of Therapy:  8 week treatment of VRE wound infection s/p back surgery  Plan:  1. Continue Daptomycin 500 mg (~6 mg/kg) IV every 24 hours 2. Continue Meropenem 1g IV every 8 hours 3. Weekly CK levels on Tuesdays while on Dapto 4. Will continue to  monitor renal function for any necessary dose adjustments  Tad Moore, BCPS  Clinical Pharmacist Pager [phone]  04/27/2014 9:42 AM

## 2014-04-27 NOTE — Progress Notes (Signed)
ANTICOAGULATION CONSULT NOTE - Initial Consult  Pharmacy Consult for Heparin Indication: DVT  No Known Allergies  Patient Measurements: Height: 5\' 8"  (172.7 cm) Weight: 187 lb 8 oz (85.049 kg) IBW/kg (Calculated) : 68.4 Heparin Dosing Weight: 85 kg  Vital Signs: Temp: 98 F (36.7 C) (06/16 1548) Temp src: Oral (06/16 1548) BP: 80/52 mmHg (06/16 1500) Pulse Rate: 66 (06/16 1500)  Labs:  Recent Labs  04/25/14 1805 04/26/14 2345 04/27/14 0500 04/27/14 1313 04/27/14 1435 04/27/14 1550  HGB 9.2* 9.3* 8.2*  --   --   --   HCT 29.3* 30.3* 27.0*  --   --   --   PLT 200 155 165  --   --   --   HEPARINUNFRC  --   --   --   --   --  0.39  CREATININE 0.75 0.77 0.63  --   --   --   CKTOTAL 39  --   --   --   --   --   TROPONINI  --   --   --  <0.30 <0.30  --     Estimated Creatinine Clearance: 89.8 ml/min (by C-G formula based on Cr of 0.63).   Medical History: Past Medical History  Diagnosis Date  . Hypertension   . Heart murmur   . Peripheral vascular disease   . Arthritis   . Hepatitis 1954    Medications:  Scheduled:  . antiseptic oral rinse  15 mL Mouth Rinse q12n4p  . aspirin EC  81 mg Oral Daily  . chlorhexidine  15 mL Mouth Rinse BID  . DAPTOmycin (CUBICIN)  IV  500 mg Intravenous Q24H  . folic acid  1 mg Oral Daily  . hydrocortisone sodium succinate  50 mg Intravenous Q6H  . insulin aspart  0-15 Units Subcutaneous TID WC  . lactobacillus acidophilus  1 tablet Oral BID  . meropenem (MERREM) IV  1 g Intravenous 3 times per day  . multivitamin with minerals  1 tablet Oral Daily  . pantoprazole  40 mg Oral Q1200  . senna  1 tablet Oral QHS  . sodium chloride  10-40 mL Intracatheter Q12H  . tamsulosin  0.4 mg Oral Daily  . vitamin C  250 mg Oral TID WC  . zinc sulfate  220 mg Oral Daily    Assessment: Initial 6hr heparin level is 0.39, therapeutic on heparin 1200 units/hr in this 73 yo male for partial acute DVT of right common femoral vein. No bleeding  noted.   Hgb low at baseline, Pltc WNL.  Goal of Therapy:  Heparin level 0.3-0.7 units/ml Monitor platelets by anticoagulation protocol: Yes   Plan: Continue heparin drip at 1200 units/hr. Recheck heparin level 6 hrs to confirm it remains therpeutic then will monitor daily heparin level and CBC.  Will watch CBC closely on IV heparin. F/u plans for oral anticoagulation?  62, RPh Clinical Pharmacist Pager: [phone]  04/27/2014 4:57 PM

## 2014-04-27 NOTE — Progress Notes (Signed)
Utilization Review Completed.  

## 2014-04-27 NOTE — Progress Notes (Signed)
2D Echocardiogram Complete.  04/27/2014   Catalia Massett, RDCS 

## 2014-04-27 NOTE — Discharge Summary (Signed)
Physician Discharge Summary  Patient ID: Jon Lozano MRN: [id number] DOB/AGE: 1941/04/16 73 y.o.  Admit date: 04/21/2014 Discharge date: 04/26/2014  Discharge Diagnoses:  Principal Problem:   Sepsis(995.91) Active Problems:   Physical deconditioning   HTN (hypertension)   Bacteremia   Pneumonia, organism unspecified   VRE (vancomycin-resistant Enterococci) infection   PVD (peripheral vascular disease)   Fever   Paroxysmal supraventricular tachycardia   Discharged Condition: Guarded  Significant Diagnostic Studies: Dg Chest 1 View  04/22/2014   CLINICAL DATA:  PICC line placement  EXAM: CHEST - 1 VIEW  COMPARISON:  Portable exam 1548 hr compared to 03/22/2014  FINDINGS: RIGHT arm PICC line tip projects over SVC.  Minimal enlargement of cardiac silhouette.  Mediastinal contours and pulmonary vascularity normal.  Mild persistent LEFT basilar infiltrate.  Improving aeration at RIGHT base.  No pleural effusion or pneumothorax.  IMPRESSION: Tip of RIGHT arm PICC line projects over mid SVC.  Persistent LEFT basilar and improving RIGHT basilar pulmonary infiltrates.   Electronically Signed   By: Ulyses Southward M.D.   On: 04/22/2014 17:14   Dg Chest 2 View  04/26/2014   CLINICAL DATA:  Hypoxia.  Question CHF  EXAM: CHEST  2 VIEW  COMPARISON:  04/25/2014  FINDINGS: Right arm PICC tip in the SVC unchanged.  Diffusely increased lung markings unchanged from the prior study. Question mild CHF versus chronic lung disease. No significant effusion.  IMPRESSION: Prominent lung markings are stable and may be due to chronic CHF or chronic lung disease and fibrosis. No superimposed edema or effusion.   Electronically Signed   By: Marlan Palau M.D.   On: 04/26/2014 17:15   Dg Chest 2 View  04/25/2014   CLINICAL DATA:  Short of breath.  Fever.  Back pain.  EXAM: CHEST  2 VIEW  COMPARISON:  04/22/2014.  FINDINGS: Right upper extremity PICC is unchanged. The cardiopericardial silhouette remains enlarged.  Bilateral basilar predominant airspace disease with diffuse interstitial prominence. Compared to the prior exam, there is no interval change aside from lower lung volumes. No pleural effusion.  IMPRESSION: 1. Unchanged right upper extremity PICC with the tip in the mid SVC. 2. Lower lung volumes with interstitial and alveolar basilar opacities.   Electronically Signed   By: Andreas Newport M.D.   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 He was d/c to SNF from [facility] deloped purulent drainage on 03/10/14 with wound cultures positive for VRE. Hardware was removed by Dr. Alger Memos and [facility] placed with recommendations for daptomycin as well as cefepime with recommendations for 8 weeks of antibiotic therapy. He was readmitted to [facility] on 03/22/14 for antibiotic as well as therapy. Sputum culture  from [place] positive for Kleb-pneumonia and cefepime was changed to meropenum on 03/26/14. ID following for input with recommendations to continue antibiotics through 05/04/14. Was on dysphagia diet with honey liquids and has been advanced to nectars at bedside on 04/09/14. 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 Back wound continues to have copious drainage with areas of dehiscence. Oxycodone scheduled bid to help with pain control. Foley was discontinued last week with incontinence reported. Endurance was improving  and CIR recommended by rehab team   Hospital Course: Jon Lozano was admitted to rehab 04/21/2014 for inpatient therapies to consist of PT, ST and OT at least three hours five days a week. Past admission physiatrist, therapy team and rehab RN have worked together to provide customized collaborative inpatient rehab. Vitals were monitored every 8 hours and BS have been monitored on ac/hs basis. He has had complaints of significant pain therefore oxycodone was increased to 10 mg bid prior to am and afternoon therapies. VAC was changed out on 04/22/14 with WOC nurse for input on wound. He has greater than 90% yellow slough on proximal dehisced site with undermining as well as tracking down the length of incision. Dr. Loralie Champagne was consulted for input on evaluation for debridement as VAC contraindicated with amount of slough present. He felt that surgical debridement was contraindicated at current time and was willing to give enzymatic debridement a try for a few days prior to resuming VAC. Patient has continued to have copious drainage requiring multiple dressing changes when up in therapy.   Patient developed fever of 103 with lethargy on 04/25/14 and he was pan cultured. CXR was done showing low lung volumes. Mentation improved past deffervescing with tylenol and he was started on IVF for hydration. Dr. Rhona Leavens was consulted for input/mangement and recommended monitoring for results od culture and no change in current antibiotic regimen recommended. Patient was noted to be more fatigued on 04/26/14 with difficulty completing therapy tasks as well as difficulty following directions. He spiked a temp of 102 that afternoon with subsequent lethargy and hypoxia. He was placed on oxygen and IVF were discontinued to to congested lung sounds. Scheduled oxycodone was discontinued due to concerns of sedating SE.  CXR was repeated and showed no sign of overload. Mentation did improve past defervesence and he was alert and  conversant. Later in the evening he developed hypotension with bradycardia and lethargy with signs of sepsis.  CCM was consulted for input and patient was transferred to acute for closer monitoring.  Disposition:  Step down unit.    Diet: NPO  Special Instructions: 1. Santyl with damp to dry dressing changes daily. Change cover frequently to keep wound from maceration.     Medication List    ASK your doctor about these medications       aspirin 81 MG tablet  Take 81 mg by mouth daily.     clopidogrel 75 MG tablet  Commonly known as:  PLAVIX  Take 75 mg by mouth daily.     DAPTOmycin in sodium chloride 0.9 % 100 mL  Inject 500 mg into the vein daily.     feeding supplement (PRO-STAT SUGAR FREE 64) Liqd  Take 30 mLs by mouth 3 (three) times daily with meals.     ferrous sulfate 325 (65 FE) MG tablet  Take 325 mg by mouth 3 (three) times daily with meals.     folic acid 1 MG tablet  Commonly known as:  FOLVITE  Take 1 mg  by mouth daily.     furosemide 20 MG tablet  Commonly known as:  LASIX  Take 20 mg by mouth 2 (two) times daily.     lactobacillus acidophilus Tabs tablet  Take 1 tablet by mouth 2 (two) times daily.     lidocaine 5 %  Commonly known as:  LIDODERM  Place 1 patch onto the skin daily. Remove & Discard patch within 12 hours or as directed by MD     meropenem 1 g in sodium chloride 0.9 % 100 mL  Inject 1 g into the vein every 8 (eight) hours. For total 6 weeks     mirtazapine 15 MG tablet  Commonly known as:  REMERON  Take 15 mg by mouth at bedtime.     MUCINEX MAXIMUM STRENGTH 1200 MG Tb12  Generic drug:  Guaifenesin  Take 1 tablet by mouth 2 (two) times daily.     multivitamins ther. w/minerals Tabs tablet  Take 1 tablet by mouth daily.     oxycodone 5 MG capsule  Commonly known as:  OXY-IR  Take 5 mg by mouth 2 (two) times daily.     pantoprazole 40 MG tablet  Commonly known as:  PROTONIX  Take 40 mg by mouth daily.     polyethylene  glycol packet  Commonly known as:  MIRALAX / GLYCOLAX  Take 17 g by mouth 2 (two) times daily.     potassium chloride SA 20 MEQ tablet  Commonly known as:  K-DUR,KLOR-CON  Take 40 mEq by mouth daily.     predniSONE 10 MG tablet  Commonly known as:  DELTASONE  Take 10 mg by mouth daily with breakfast.     pregabalin 100 MG capsule  Commonly known as:  LYRICA  Take 100 mg by mouth 3 (three) times daily.     senna 8.6 MG Tabs tablet  Commonly known as:  SENOKOT  Take 1 tablet by mouth at bedtime.     tamsulosin 0.4 MG Caps capsule  Commonly known as:  FLOMAX  Take 0.4 mg by mouth daily.     vitamin C 250 MG tablet  Commonly known as:  ASCORBIC ACID  Take 250 mg by mouth 3 (three) times daily with meals.     zinc sulfate 220 MG capsule  Take 220 mg by mouth daily.         SignedJacquelynn Cree 04/27/2014, 3:48 PM

## 2014-04-28 ENCOUNTER — Encounter (HOSPITAL_COMMUNITY): Payer: Self-pay | Admitting: Interventional Cardiology

## 2014-04-28 DIAGNOSIS — R509 Fever, unspecified: Secondary | ICD-10-CM

## 2014-04-28 DIAGNOSIS — I350 Nonrheumatic aortic (valve) stenosis: Secondary | ICD-10-CM | POA: Diagnosis present

## 2014-04-28 DIAGNOSIS — I050 Rheumatic mitral stenosis: Secondary | ICD-10-CM | POA: Diagnosis present

## 2014-04-28 DIAGNOSIS — I4891 Unspecified atrial fibrillation: Secondary | ICD-10-CM | POA: Diagnosis present

## 2014-04-28 DIAGNOSIS — I5030 Unspecified diastolic (congestive) heart failure: Secondary | ICD-10-CM

## 2014-04-28 DIAGNOSIS — I318 Other specified diseases of pericardium: Secondary | ICD-10-CM | POA: Diagnosis present

## 2014-04-28 DIAGNOSIS — J189 Pneumonia, unspecified organism: Secondary | ICD-10-CM

## 2014-04-28 LAB — BASIC METABOLIC PANEL
BUN: 13 mg/dL (ref 6–23)
CHLORIDE: 100 meq/L (ref 96–112)
CO2: 25 meq/L (ref 19–32)
Calcium: 8.3 mg/dL — ABNORMAL LOW (ref 8.4–10.5)
Creatinine, Ser: 0.5 mg/dL (ref 0.50–1.35)
GFR calc Af Amer: >90 mL/min (ref >90)
GFR calc non Af Amer: >90 mL/min (ref >90)
Glucose, Bld: 135 mg/dL — ABNORMAL HIGH (ref 70–99)
POTASSIUM: 3.1 meq/L — AB (ref 3.7–5.3)
SODIUM: 138 meq/L (ref 137–147)

## 2014-04-28 LAB — GLUCOSE, CAPILLARY
GLUCOSE-CAPILLARY: 152 mg/dL — AB (ref 70–99)
Glucose-Capillary: 126 mg/dL — ABNORMAL HIGH (ref 70–99)
Glucose-Capillary: 137 mg/dL — ABNORMAL HIGH (ref 70–99)
Glucose-Capillary: 144 mg/dL — ABNORMAL HIGH (ref 70–99)

## 2014-04-28 LAB — CBC
HCT: 27.4 % — ABNORMAL LOW (ref 39.0–52.0)
HEMOGLOBIN: 8.5 g/dL — AB (ref 13.0–17.0)
MCH: 26.5 pg (ref 26.0–34.0)
MCHC: 31 g/dL (ref 30.0–36.0)
MCV: 85.4 fL (ref 78.0–100.0)
Platelets: 153 10*3/uL (ref 150–400)
RBC: 3.21 MIL/uL — AB (ref 4.22–5.81)
RDW: 16.1 % — AB (ref 11.5–15.5)
WBC: 5 10*3/uL (ref 4.0–10.5)

## 2014-04-28 LAB — HEPARIN LEVEL (UNFRACTIONATED)
HEPARIN UNFRACTIONATED: 0.24 [IU]/mL — AB (ref 0.30–0.70)
Heparin Unfractionated: 0.42 IU/mL (ref 0.30–0.70)

## 2014-04-28 LAB — URINE CULTURE
COLONY COUNT: NO GROWTH
CULTURE: NO GROWTH

## 2014-04-28 LAB — MAGNESIUM: Magnesium: 2.3 mg/dL (ref 1.5–2.5)

## 2014-04-28 LAB — PHOSPHORUS: PHOSPHORUS: 2 mg/dL — AB (ref 2.3–4.6)

## 2014-04-28 MED ORDER — MIRTAZAPINE 15 MG PO TABS
15.0000 mg | ORAL_TABLET | Freq: Every day | ORAL | Status: DC
  Administered 2014-04-28 – 2014-05-08 (×11): 15 mg via ORAL
  Filled 2014-04-28 (×12): qty 1

## 2014-04-28 MED ORDER — SODIUM CHLORIDE 0.9 % IV BOLUS (SEPSIS)
1000.0000 mL | Freq: Once | INTRAVENOUS | Status: AC
  Administered 2014-04-28: 1000 mL via INTRAVENOUS

## 2014-04-28 MED ORDER — LIDOCAINE 5 % EX PTCH
1.0000 | MEDICATED_PATCH | Freq: Every day | CUTANEOUS | Status: DC
  Administered 2014-04-28 – 2014-05-12 (×15): 1 via TRANSDERMAL
  Filled 2014-04-28 (×15): qty 1

## 2014-04-28 MED ORDER — POTASSIUM CHLORIDE CRYS ER 20 MEQ PO TBCR
40.0000 meq | EXTENDED_RELEASE_TABLET | Freq: Once | ORAL | Status: AC
  Administered 2014-04-28: 40 meq via ORAL
  Filled 2014-04-28: qty 2

## 2014-04-28 MED ORDER — POTASSIUM CHLORIDE 10 MEQ/100ML IV SOLN
10.0000 meq | INTRAVENOUS | Status: AC
  Administered 2014-04-28 (×4): 10 meq via INTRAVENOUS
  Filled 2014-04-28 (×4): qty 100

## 2014-04-28 MED ORDER — OXYCODONE HCL 5 MG PO TABS
5.0000 mg | ORAL_TABLET | Freq: Two times a day (BID) | ORAL | Status: DC
  Administered 2014-04-28 (×2): 5 mg via ORAL
  Filled 2014-04-28 (×4): qty 1

## 2014-04-28 MED ORDER — PREGABALIN 25 MG PO CAPS
100.0000 mg | ORAL_CAPSULE | Freq: Three times a day (TID) | ORAL | Status: DC
  Administered 2014-04-28 – 2014-05-10 (×37): 100 mg via ORAL
  Filled 2014-04-28 (×44): qty 1

## 2014-04-28 MED ORDER — COLLAGENASE 250 UNIT/GM EX OINT
TOPICAL_OINTMENT | Freq: Every day | CUTANEOUS | Status: DC
  Administered 2014-04-28 – 2014-05-09 (×12): via TOPICAL
  Administered 2014-05-10: 1 via TOPICAL
  Administered 2014-05-12: 11:00:00 via TOPICAL
  Filled 2014-04-28 (×2): qty 30

## 2014-04-28 NOTE — Consult Note (Signed)
CARDIOLOGY CONSULT NOTE      Patient ID: LENWOOD BALSAM MRN: [id number] DOB/AGE: 1941-04-22 73 y.o.  Admit date: 04/26/2014 Referring Physician Dr. Craige Cotta  Primary CardiologistDr. Hanley Hays [facility])/ Dr. 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 This postoperative course was complicated due to infection with several different organisms. He went to a skilled nursing facility in April 2015. He has been admitted with pneumonia. He was referred for this and went to inpatient rehabilitation. On June 15, he developed a fever with altered mental status and is now in the ICU. He has been hypotensive. He has been requiring IV phenylephrine for blood pressure support.  Echocardiogram was performed. He does have a history of mitral stenosis and aortic stenosis. The aortic stenosis has been moderate most. Several quantification is no mitral stenosis have called at moderate to severe. He has had atrial fibrillation over the past few years. It is increasing in frequency. He is not on chronic anticoagulation at home. He does take Plavix.  Currently, he reports some shortness of breath when lying flat. We're consult in 2 to a CT scan finding. The CT scan must rule out PE. However it was noted, but he had pericardial calcifications and features suggestive of constriction.  Review of systems complete and found to be negative unless listed above   Past Medical History  Diagnosis Date  . Hypertension   . Heart murmur   . Peripheral vascular disease   . Arthritis   . Hepatitis 1954    Family History  Problem Relation Age of Onset  . Heart disease Father     History   Social History  . Marital Status: Married    Spouse Name: N/A    Number of Children: N/A  . Years of Education: N/A   Occupational History  . Not on file.   Social History Main Topics  . Smoking status: Former Smoker -- 1.00  packs/day    Types: Cigarettes  . Smokeless tobacco: Former Neurosurgeon    Quit date: 10/24/2011  . Alcohol Use: Yes     Comment: 3 beers per week  . Drug Use: No  . Sexual Activity:    Other Topics Concern  . Not on file   Social History Narrative  . No narrative on file    Past Surgical History  Procedure Laterality Date  . Cholecystectomy  1983  . Pericardial fluid drainage  2007  . Cataract extraction  2012  . Tee without cardioversion  10/23/2012    Procedure: TRANSESOPHAGEAL ECHOCARDIOGRAM (TEE);  Surgeon: Thurmon Fair, MD;  Location: [facility] ENDOSCOPY;  Service: Cardiovascular;  Laterality: N/A;  to come [id number] for work up     Prescriptions prior to admission  Medication Sig Dispense Refill  . Amino Acids-Protein Hydrolys (FEEDING SUPPLEMENT, PRO-STAT SUGAR FREE 64,) LIQD Take 30 mLs by mouth 3 (three) times daily with meals.      Marland Kitchen aspirin 81 MG tablet Take 81 mg by mouth daily.      Marland Kitchen DAPTOmycin in sodium chloride 0.9 % 100 mL Inject 500 mg into the vein daily.      . ferrous sulfate 325 (65 FE) MG tablet Take 325 mg by mouth 3 (three) times daily with meals.      . folic acid (FOLVITE) 1 MG tablet Take 1 mg by mouth daily.      . furosemide (LASIX) 20 MG tablet Take 20 mg by mouth 2 (  two) times daily.      . Guaifenesin (MUCINEX MAXIMUM STRENGTH) 1200 MG TB12 Take 1 tablet by mouth 2 (two) times daily.      Marland Kitchen lactobacillus acidophilus (BACID) TABS tablet Take 1 tablet by mouth 2 (two) times daily.       Marland Kitchen lidocaine (LIDODERM) 5 % Place 1 patch onto the skin daily. Remove & Discard patch within 12 hours or as directed by MD      . meropenem 1 g in sodium chloride 0.9 % 100 mL Inject 1 g into the vein every 8 (eight) hours. For total 6 weeks      . mirtazapine (REMERON) 15 MG tablet Take 15 mg by mouth at bedtime.      . Multiple Vitamins-Minerals (MULTIVITAMINS THER. W/MINERALS) TABS tablet Take 1 tablet by mouth daily.      Marland Kitchen oxycodone (OXY-IR) 5 MG capsule Take 5 mg by mouth 2  (two) times daily.      . pantoprazole (PROTONIX) 40 MG tablet Take 40 mg by mouth daily.      . polyethylene glycol (MIRALAX / GLYCOLAX) packet Take 17 g by mouth 2 (two) times daily.      . potassium chloride SA (K-DUR,KLOR-CON) 20 MEQ tablet Take 40 mEq by mouth daily.      . predniSONE (DELTASONE) 10 MG tablet Take 10 mg by mouth daily with breakfast.      . pregabalin (LYRICA) 100 MG capsule Take 100 mg by mouth 3 (three) times daily.      Marland Kitchen senna (SENOKOT) 8.6 MG TABS tablet Take 1 tablet by mouth at bedtime.      . tamsulosin (FLOMAX) 0.4 MG CAPS capsule Take 0.4 mg by mouth daily.      . vitamin C (ASCORBIC ACID) 250 MG tablet Take 250 mg by mouth 3 (three) times daily with meals.      . zinc sulfate 220 MG capsule Take 220 mg by mouth daily.      . clopidogrel (PLAVIX) 75 MG tablet Take 75 mg by mouth daily.        Physical Exam: Vitals:   Filed Vitals:   04/28/14 1200 04/28/14 1244 04/28/14 1255 04/28/14 1300  BP: 82/46  97/68   Pulse: 77  69 74  Temp:  97.3 F (36.3 C)    TempSrc:  Oral    Resp: 22  18 22   Height:      Weight:      SpO2: 98%  99% 98%   I&O's:   Intake/Output Summary (Last 24 hours) at 04/28/14 1341 Last data filed at 04/28/14 1302  Gross per 24 hour  Intake 3992.76 ml  Output   1315 ml  Net 2677.76 ml   Physical exam:  [place]/AT EOMI No JVD, No carotid bruit Irregularly irregular S1S2 , distant heart sounds No wheezing Soft. mild obesity, nontender Trace bilateral pretibial edema. No focal motor or sensory deficits Normal affect  Labs:   Lab Results  Component Value Date   WBC 5.0 04/28/2014   HGB 8.5* 04/28/2014   HCT 27.4* 04/28/2014   MCV 85.4 04/28/2014   PLT 153 04/28/2014    Recent Labs Lab 04/25/14 1805  04/28/14 0240  NA 138  < > 138  K 4.1  < > 3.1*  CL 96  < > 100  CO2 28  < > 25  BUN 18  < > 13  CREATININE 0.75  < > 0.50  CALCIUM 9.4  < > 8.3*  PROT 6.7  --   --   BILITOT 0.8  --   --   ALKPHOS 152*  --   --   ALT 14   --   --   AST 26  --   --   GLUCOSE 174*  < > 135*  < > = values in this interval not displayed. Lab Results  Component Value Date   CKTOTAL 39 04/25/2014   TROPONINI <0.30 04/27/2014    Lab Results  Component Value Date   CHOL 109 02/06/2014   Lab Results  Component Value Date   HDL 23* 02/06/2014   Lab Results  Component Value Date   LDLCALC 63 02/06/2014   Lab Results  Component Value Date   TRIG 115 02/06/2014   Lab Results  Component Value Date   CHOLHDL 4.7 02/06/2014   No results found for this basename: LDLDIRECT      Radiology: CT scan findings as noted above; pleural effusions and pulmonary edema noted as well by CT scan EKG:  Atrial fibrillation, right bundle branch block ASSESSMENT AND PLAN:  Atrial fibrillation: This may be related to his mitral stenosis. He has evidence of left atrial enlargement. Left ventricular function is preserved. When he is more stable from a medical standpoint, would have to readdress the benefits versus risks of anticoagulation. Clearly, his CHADS score is high and he is at fairly high risk for thromboembolic event off of anticoagulation.  ? Constrictive pericarditis: I personally reviewed his echocardiogram. In the four-chamber view, there is some suggestion of ventricular interdependence and abnormal septal motion. It is difficult to quantify as the recorded loop is not long enough. Once he is off of pressors, it would be helpful to have a longer loop with respirometer recorded by echocardiogram to see the interventricular septal motion, and how it varies with respiration. At the moment, he is not a candidate for pericardial stripping due to his other comorbidities. Could consider cardiac MRI as well if he is a candidate, to further evaluate the pericardium when he is more stable from a medical standpoint.  Diastolic heart failure: Fluid overload likely related to valvular heart disease as well as LV diastolic dysfunction from atrial  fibrillation . Diuresis when blood pressure allows.   Will follow. Signed:   Fredric Mare, MD, [facility] 04/28/2014, 1:41 PM

## 2014-04-28 NOTE — Progress Notes (Signed)
ANTICOAGULATION CONSULT NOTE - Follow Up Consult  Pharmacy Consult for Heparin  Indication: DVT  No Known Allergies  Patient Measurements: Height: 5\' 8"  (172.7 cm) Weight: 187 lb 8 oz (85.049 kg) IBW/kg (Calculated) : 68.4  Vital Signs: Temp: 97.5 F (36.4 C) (06/17 0359) Temp src: Oral (06/17 0359) BP: 91/57 mmHg (06/17 0300) Pulse Rate: 50 (06/17 0300)  Labs:  Recent Labs  04/25/14 1805 04/26/14 2345 04/27/14 0500 04/27/14 1313 04/27/14 1435 04/27/14 1550 04/27/14 2126 04/27/14 2130 04/28/14 0240  HGB 9.2* 9.3* 8.2*  --   --   --   --   --  8.5*  HCT 29.3* 30.3* 27.0*  --   --   --   --   --  27.4*  PLT 200 155 165  --   --   --   --   --  153  HEPARINUNFRC  --   --   --   --   --  0.39  --  0.36 0.24*  CREATININE 0.75 0.77 0.63  --   --   --   --   --  0.50  CKTOTAL 39  --   --   --   --   --   --   --   --   TROPONINI  --   --   --  <0.30 <0.30  --  <0.30  --   --     Estimated Creatinine Clearance: 89.8 ml/min (by C-G formula based on Cr of 0.5).   Medications:  Heparin 1200 units/hr  Assessment: 73 y/o M on heparin for DVT. HL is 0.24. Other labs as above.   Goal of Therapy:  Heparin level 0.3-0.7 units/ml Monitor platelets by anticoagulation protocol: Yes   Plan:  -Increase heparin to 1350 units/hr -1200 HL -Daily CBC/HL -Monitor for bleeding  62 04/28/2014,4:12 AM

## 2014-04-28 NOTE — Progress Notes (Signed)
PULMONARY / CRITICAL CARE MEDICINE   Name: Jon Lozano MRN: [id number] DOB: Feb 20, 1941    ADMISSION DATE:  04/26/2014 CONSULTATION DATE:  04/26/2014  REFERRING MD :  EDP  CHIEF COMPLAINT:  Hypotension, AMS  BRIEF PATIENT DESCRIPTION:  73 y.o. M who underwent back surgery 01/04/14, complicated by hardware infection with MRSA, VRE, and proteus bacteremia.  He was initially on [facility], then d/c'd to SNF on 4/29.  Hardware was removed and he was readmitted to [facility] on 5/11.  Course further complicated by Klebsiella PNA on sputum culture.  On 6/10, was transferred to CIR then 6/15 developed new fever, altered mental status, lethargy.  PCCM was consulted.  SIGNIFICANT EVENTS: 2/23 - back surgery. 4/29 - d/c'd to SNF. 5/11 - admitted to [facility] (after hardware removal). 6/10 - transferred to CIR. 6/15 - developed fever to 102, tachycardia, hypoxia, AMS, lethargy.  STUDIES:  6/10 Doppler legs >> partial acute DVT Rt common femoral vein 6/16 Echo >> mild LVH, EF 65 to 70%, grade 3 diastolic dysfx, mild AS, mild/mod MS, mod LA dilation 6/16 CT chest >> no PE, atherosclerosis, emphysema with interstitial fibrosis, small pericardial effusions, Rt base consolidation, foci of calcification in pericardium  LINES / TUBES: Right PICC >>>  CULTURES: Blood 4/1 >>> neg Blood 5/26 >>> neg Blood ? Date >>> POS MRSA, VRE, PROTEUS. Sputum ? Date >>> POS KLEBSIELLA. Blood 6/15 >>> Urine 6/15 >>> negative  ANTIBIOTICS: Cefepime ? >>> 5/15  Daptomycin >>> (tentatively through 6/23 per ID) Meropenem >>> (tentatively through 6/23 per ID)  SUBJECTIVE:  Remains on low dose pressors.  Denies cough, chest pain, abdominal pain.  Tolerating diet.  VITAL SIGNS: Temp:  [97.2 F (36.2 C)-98 F (36.7 C)] 97.2 F (36.2 C) (06/17 0802) Pulse Rate:  [29-112] 49 (06/17 0700) Resp:  [15-23] 19 (06/17 0700) BP: (78-133)/(44-118) 90/50 mmHg (06/17 0700) SpO2:  [96 %-100 %] 100 % (06/17 0700) Weight:  [198 lb 6.6 oz  (90 kg)] 198 lb 6.6 oz (90 kg) (06/17 0500) INTAKE / OUTPUT: Intake/Output     06/16 0701 - 06/17 0700 06/17 0701 - 06/18 0700   P.O. 1080    I.V. (mL/kg) 2393 (26.6)    IV Piggyback 610    Total Intake(mL/kg) 4083 (45.4)    Urine (mL/kg/hr) 1160 (0.5)    Total Output 1160     Net +2923          Stool Occurrence 2 x      PHYSICAL EXAMINATION: General: no distress Neuro: alert, follows commands, moves extremities HEENT: no sinus tenderness Cardiovascular: irregular, 2/6 SM Lungs: decreased breath sounds at bases, no wheeze Abdomen: soft, non tender Musculoskeletal: no edema Skin: no rashes  LABS: PULMONARY  Recent Labs Lab 04/26/14 2135  PHART 7.433  PCO2ART 48.6*  PO2ART 134.0*  HCO3 32.5*  TCO2 34  O2SAT 99.0   CBC  Recent Labs Lab 04/26/14 2345 04/27/14 0500 04/28/14 0240  HGB 9.3* 8.2* 8.5*  HCT 30.3* 27.0* 27.4*  WBC 7.2 7.5 5.0  PLT 155 165 153   CHEMISTRY  Recent Labs Lab 04/22/14 0610 04/25/14 1805 04/26/14 2345 04/27/14 0500 04/28/14 0240  NA 141 138 142 144 138  K 3.5* 4.1 3.9 3.6* 3.1*  CL 97 96 98 104 100  CO2 29 28 31 30 25   GLUCOSE 86 174* 93 117* 135*  BUN 20 18 18 16 13   CREATININE 0.68 0.75 0.77 0.63 0.50  CALCIUM 9.1 9.4 8.8 8.0* 8.3*  MG  --   --  2.3 2.2 2.3  PHOS  --   --  3.8 3.3 2.0*   Estimated Creatinine Clearance: 92.2 ml/min (by C-G formula based on Cr of 0.5).   LIVER  Recent Labs Lab 04/22/14 0610 04/25/14 1805  AST 25 26  ALT 14 14  ALKPHOS 148* 152*  BILITOT 0.8 0.8  PROT 6.6 6.7  ALBUMIN 2.7* 2.7*   INFECTIOUS  Recent Labs Lab 04/26/14 2345 04/27/14 04/27/14 0500  LATICACIDVEN  --  1.6 0.7  PROCALCITON 0.11  --  <0.10   ENDOCRINE CBG (last 3)   Recent Labs  04/27/14 1552 04/27/14 2135 04/28/14 0356  GLUCAP 106* 232* 126*   IMAGING x48h  Dg Chest 2 View  04/26/2014   CLINICAL DATA:  Hypoxia.  Question CHF  EXAM: CHEST  2 VIEW  COMPARISON:  04/25/2014  FINDINGS: Right arm PICC tip  in the SVC unchanged.  Diffusely increased lung markings unchanged from the prior study. Question mild CHF versus chronic lung disease. No significant effusion.  IMPRESSION: Prominent lung markings are stable and may be due to chronic CHF or chronic lung disease and fibrosis. No superimposed edema or effusion.   Electronically Signed   By: Marlan Palau M.D.   On: 04/26/2014 17:15   Ct Angio Chest Pe W/cm &/or Wo Cm  04/27/2014   CLINICAL DATA:  Right lower extremity deep venous thrombosis  EXAM: CT ANGIOGRAPHY CHEST WITH CONTRAST  TECHNIQUE: Multidetector CT imaging of the chest was performed using the standard protocol during bolus administration of intravenous contrast. Multiplanar CT image reconstructions and MIPs were obtained to evaluate the vascular anatomy.  CONTRAST:  47mL OMNIPAQUE IOHEXOL 350 MG/ML SOLN  COMPARISON:  Chest CT February 04, 2014 and chest radiograph April 27, 2014  FINDINGS: There is no demonstrable pulmonary embolus. There is atherosclerotic change in the aorta. There is no demonstrable thoracic aortic aneurysm or dissection.  There is underlying emphysema with areas of interstitial fibrosis. There are small pleural effusions with what appears to be some interstitial edema superimposed on interstitial fibrotic change. There is an area of opacity in the posterior segment right upper lobe which appears to represent rounded atelectasis. There may be some superimposed pneumonia in this area. There is an area of airspace consolidation in the posterior right base which appears new from the prior study and is felt to represent localized pneumonia in this area.  There is a small pericardial effusion. There are scattered foci of calcification within the pericardium. This finding is stable. The pericardium is also thickened on the left side with relative straightening of the left lateral heart border. There is the aortic valve and mitral annular calcification as well, stable from prior study.  There  is no appreciable thoracic adenopathy. A borderline prominent superior right paratracheal lymph node is stable and most likely of reactive etiology.  The visualized upper abdominal structures appear normal except for atherosclerotic change. There are no blastic or lytic bone lesions. Thyroid appears normal.  Review of the MIP images confirms the above findings.  IMPRESSION: No demonstrable pulmonary embolus.  Evidence of congestive heart failure superimposed on interstitial fibrosis. Note that there is a small pericardial effusion as well as scattered areas of pericardial calcification on the left. There is also extensive aortic valve and mitral annulus calcification, stable. 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 There appears to be some straightening of the lateral left heart border near the pericardium, a finding that could be indicative of  constrictive pericarditis. Echocardiography could be helpful to further assess in this regard. Note that this appearance is essentially unchanged from prior CT examination.  Suspect rounded atelectasis in the posterior segment right upper lobe. There may be some superimposed infiltrate in this area. This appearance suggesting rounded atelectasis was also present on the previous study and is stable. There is also an area of localized pneumonia in the right base posteriorly.   Electronically Signed   By: Bretta Bang M.D.   On: 04/27/2014 10:28   Dg Chest Port 1 View  04/27/2014   CLINICAL DATA:  Followup prominent lung markings  EXAM: PORTABLE CHEST - 1 VIEW  COMPARISON:  Portable exam 0620 hr compared to 04/26/2014  FINDINGS: RIGHT arm PICC line tip projects over SVC.  Rotated to the RIGHT.  Enlargement of cardiac silhouette.  Mediastinal contours and pulmonary vascularity normal.  Persistent accentuation of interstitial markings similar to previous exam.  Minimal infiltrate or atelectasis at LEFT base.  Lungs otherwise  clear.  No pleural effusion or pneumothorax.  Chronic RIGHT rotator cuff tear.  IMPRESSION: Mild enlargement of cardiac silhouette.  Chronic accentuation of interstitial markings.  Minimal atelectasis versus infiltrate at LEFT base.   Electronically Signed   By: Ulyses Southward M.D.   On: 04/27/2014 08:14      ASSESSMENT / PLAN:  PULMONARY A: Atelectasis. Hx of COPD/emphysema. P:   Bronchial hygiene F/u CXR 6/18 Oxygen to keep SpO2 > 92% BD's prn  CARDIOVASCULAR A:  Hypotension - appears hypovolemic on exam. Rt leg DVT. A fib, valvular heart disease, pericardial calcification, chronic diastolic heart failure. P:  Continue IV fluids Heparin gtt per pharmacy ASA Hold lasix, plavix Consult cardiology  RENAL A:  Hypokalemia. Urinary retention P:   F/u and replace electrolytes as needed Resume flomax Keep foley in for now  GASTROINTESTINAL A:   Nutrition. GERD. P:   Regular diet Continue protonix  HEMATOLOGIC A:  Mild anemia. P:  F/u CBC Transfuse for Hb < 7  INFECTIOUS A:   MRSA, VRE, Proteus Bacteremia. HCAP. P:   Continue current Abx.  ENDOCRINE A:   Hyperglycemia. P:   SSI  NEUROLOGIC A:   Acute encephalopathy >> resolved 6/17. P:   Resume remeron, oxycodone, lyrica 6/17  RHEUMATOLOGY A: Hx of Rheumatoid arthritis on chronic prednisone. P: Stress dose steroids for now >> transition pack to prednisone when off pressors  CC time 35 minutes.  Coralyn Helling, MD [facility] Pulmonary/Critical Care 04/28/2014, 8:26 AM Pager:  [phone] After 3pm call: [phone]

## 2014-04-28 NOTE — Progress Notes (Signed)
eLink Physician-Brief Progress Note Patient Name: Jon Lozano DOB: January 26, 1941 MRN: [id number]  Date of Service  04/28/2014   HPI/Events of Note     eICU Interventions  Hypokalemia, repleted       MCQUAID, DOUGLAS 04/28/2014, 4:14 AM

## 2014-04-28 NOTE — Progress Notes (Addendum)
Per MD Craige Cotta, pt is able to get out of bed to chair as tolerated. Nurse will continue to monitor.

## 2014-04-28 NOTE — Progress Notes (Signed)
INITIAL NUTRITION ASSESSMENT  DOCUMENTATION CODES Per approved criteria  -Obesity Unspecified   INTERVENTION: No additional nutrition intervention at this time --- patient declined Continue MVI with minerals daily RD to follow for nutrition care plan  NUTRITION DIAGNOSIS: Increased nutrient needs related to wound healing as evidenced by estimated nutrition needs  Goal: Pt to meet >/= 90% of their estimated nutrition needs   Monitor:  PO & supplemental intake, weight, labs, I/O's  Reason for Assessment: Malnutrition Screening Tool Report  73 y.o. male  Admitting Dx: hypotension, AMS  ASSESSMENT: 73 y.o. M who underwent back surgery 01/04/14, complicated by hardware infection with MRSA, VRE, and proteus bacteremia. He was initially on [facility], then d/c'd to SNF on 4/29. Hardware was removed and he was readmitted to [facility] on 5/11. Course further complicated by Klebsiella PNA on sputum culture. On 6/10, was transferred to CIR then 6/15 developed new fever, altered mental status, lethargy.  Patient known to Clinical Nutrition during IP Rehab stay; per RD assessment 6/10 pt reported a poor appetite since his back surgery in February 2015; pt reports a good appetite to this RD; also reports he's been gaining weight; PO intake 100% this AM at breakfast; declined addition of oral nutrition supplements at this time.  CWOCN note 6/16 reviewed -- pt with 3 areas of full thickness wounds over previous middle back surgical site where wound dehisced; multiple blistering ulcerations present to legs.  Height: Ht Readings from Last 1 Encounters:  04/26/14 5\' 8"  (1.727 m)    Weight: Wt Readings from Last 1 Encounters:  04/28/14 198 lb 6.6 oz (90 kg)    Ideal Body Weight: 154 lb  % Ideal Body Weight: 128%  Wt Readings from Last 10 Encounters:  04/28/14 198 lb 6.6 oz (90 kg)  04/26/14 181 lb (82.101 kg)  04/21/14 180 lb (81.647 kg)  10/23/12 212 lb (96.163 kg)  10/23/12 212 lb (96.163 kg)     Usual Body Weight: 180 lb  % Usual Body Weight: 110%  BMI:  Body mass index is 30.18 kg/(m^2).  Estimated Nutritional Needs: Kcal: 1900-2100 Protein: 105-115 gm Fluid: 1.9-2.1 L  Skin:  -3 areas of full thickness wounds over previous middle back surgical site where wound dehisced -multiple blistering ulcerations present to legs  Diet Order: General  EDUCATION NEEDS: -No education needs identified at this time   Intake/Output Summary (Last 24 hours) at 04/28/14 0942 Last data filed at 04/28/14 0901  Gross per 24 hour  Intake 4300.61 ml  Output   1220 ml  Net 3080.61 ml    Labs:   Recent Labs Lab 04/26/14 2345 04/27/14 0500 04/28/14 0240  NA 142 144 138  K 3.9 3.6* 3.1*  CL 98 104 100  CO2 31 30 25   BUN 18 16 13   CREATININE 0.77 0.63 0.50  CALCIUM 8.8 8.0* 8.3*  MG 2.3 2.2 2.3  PHOS 3.8 3.3 2.0*  GLUCOSE 93 117* 135*    CBG (last 3)   Recent Labs  04/27/14 2135 04/28/14 0356 04/28/14 0755  GLUCAP 232* 126* 137*    Scheduled Meds: . antiseptic oral rinse  15 mL Mouth Rinse q12n4p  . aspirin EC  81 mg Oral Daily  . chlorhexidine  15 mL Mouth Rinse BID  . DAPTOmycin (CUBICIN)  IV  500 mg Intravenous Q24H  . folic acid  1 mg Oral Daily  . hydrocortisone sodium succinate  50 mg Intravenous Q6H  . insulin aspart  0-15 Units Subcutaneous TID WC  .  lactobacillus acidophilus  1 tablet Oral BID  . lidocaine  1 patch Transdermal Daily  . meropenem (MERREM) IV  1 g Intravenous 3 times per day  . mirtazapine  15 mg Oral QHS  . multivitamin with minerals  1 tablet Oral Daily  . oxyCODONE  5 mg Oral BID  . pantoprazole  40 mg Oral Q1200  . potassium chloride  10 mEq Intravenous Q1 Hr x 4  . pregabalin  100 mg Oral TID  . senna  1 tablet Oral QHS  . sodium chloride  10-40 mL Intracatheter Q12H  . tamsulosin  0.4 mg Oral Daily  . vitamin C  250 mg Oral TID WC  . zinc sulfate  220 mg Oral Daily    Continuous Infusions: . sodium chloride 75 mL/hr  at 04/28/14 0700  . heparin 1,350 Units/hr (04/28/14 0800)  . phenylephrine (NEO-SYNEPHRINE) Adult infusion 20 mcg/min (04/28/14 0800)    Past Medical History  Diagnosis Date  . Hypertension   . Heart murmur   . Peripheral vascular disease   . Arthritis   . Hepatitis 1954    Past Surgical History  Procedure Laterality Date  . Cholecystectomy  1983  . Pericardial fluid drainage  2007  . Cataract extraction  2012  . Tee without cardioversion  10/23/2012    Procedure: TRANSESOPHAGEAL ECHOCARDIOGRAM (TEE);  Surgeon: Thurmon Fair, MD;  Location: [facility] ENDOSCOPY;  Service: Cardiovascular;  Laterality: N/A;  to come [id number] for work up    Maureen Chatters, RD, LDN Pager #: [phone] After-Hours Pager #: [phone]

## 2014-04-28 NOTE — Progress Notes (Signed)
ANTICOAGULATION CONSULT NOTE - Follow Up Consult  Pharmacy Consult for Heparin Indication: DVT  No Known Allergies  Patient Measurements: Height: 5\' 8"  (172.7 cm) Weight: 198 lb 6.6 oz (90 kg) IBW/kg (Calculated) : 68.4 Heparin Dosing Weight: 85kg  Vital Signs: Temp: 97.3 F (36.3 C) (06/17 1244) Temp src: Oral (06/17 1244) BP: 97/68 mmHg (06/17 1255) Pulse Rate: 74 (06/17 1300)  Labs:  Recent Labs  04/25/14 1805 04/26/14 2345 04/27/14 0500 04/27/14 1313 04/27/14 1435  04/27/14 2126 04/27/14 2130 04/28/14 0240 04/28/14 1310  HGB 9.2* 9.3* 8.2*  --   --   --   --   --  8.5*  --   HCT 29.3* 30.3* 27.0*  --   --   --   --   --  27.4*  --   PLT 200 155 165  --   --   --   --   --  153  --   HEPARINUNFRC  --   --   --   --   --   < >  --  0.36 0.24* 0.42  CREATININE 0.75 0.77 0.63  --   --   --   --   --  0.50  --   CKTOTAL 39  --   --   --   --   --   --   --   --   --   TROPONINI  --   --   --  <0.30 <0.30  --  <0.30  --   --   --   < > = values in this interval not displayed.  Estimated Creatinine Clearance: 92.2 ml/min (by C-G formula based on Cr of 0.5).   Medications:  Heparin drip 1350 units/hr  Assessment: 72yom continues on heparin for RLE DVT and hx Afib. Heparin level (0.42) is now therapeutic after rate increase - will continue current heparin rate and follow-up AM heparin level. - H/H and Plts stable - No significant bleeding reported  Goal of Therapy:  Heparin level 0.3-0.7 units/ml Monitor platelets by anticoagulation protocol: Yes   Plan:  1. Continue heparin drip 1350 units/hr 2. Follow-up AM heparin level and CBC 3. Follow-up oral anticoagulation discussion/inititation  04/30/14 Cleon Dew 04/28/2014,2:22 PM

## 2014-04-29 ENCOUNTER — Inpatient Hospital Stay (HOSPITAL_COMMUNITY): Payer: Medicare Other

## 2014-04-29 ENCOUNTER — Encounter (HOSPITAL_COMMUNITY): Payer: Self-pay | Admitting: *Deleted

## 2014-04-29 DIAGNOSIS — I4891 Unspecified atrial fibrillation: Secondary | ICD-10-CM

## 2014-04-29 DIAGNOSIS — I359 Nonrheumatic aortic valve disorder, unspecified: Secondary | ICD-10-CM

## 2014-04-29 LAB — GLUCOSE, CAPILLARY
GLUCOSE-CAPILLARY: 139 mg/dL — AB (ref 70–99)
GLUCOSE-CAPILLARY: 167 mg/dL — AB (ref 70–99)
Glucose-Capillary: 129 mg/dL — ABNORMAL HIGH (ref 70–99)
Glucose-Capillary: 114 mg/dL — ABNORMAL HIGH (ref 70–99)
Glucose-Capillary: 142 mg/dL — ABNORMAL HIGH (ref 70–99)
Glucose-Capillary: 164 mg/dL — ABNORMAL HIGH (ref 70–99)

## 2014-04-29 LAB — BASIC METABOLIC PANEL
BUN: 12 mg/dL (ref 6–23)
CHLORIDE: 108 meq/L (ref 96–112)
CO2: 25 meq/L (ref 19–32)
Calcium: 8.6 mg/dL (ref 8.4–10.5)
Creatinine, Ser: 0.63 mg/dL (ref 0.50–1.35)
GFR calc non Af Amer: >90 mL/min (ref >90)
Glucose, Bld: 134 mg/dL — ABNORMAL HIGH (ref 70–99)
Potassium: 4.5 mEq/L (ref 3.7–5.3)
Sodium: 144 mEq/L (ref 137–147)

## 2014-04-29 LAB — CBC
HCT: 28.8 % — ABNORMAL LOW (ref 39.0–52.0)
Hemoglobin: 8.7 g/dL — ABNORMAL LOW (ref 13.0–17.0)
MCH: 26.1 pg (ref 26.0–34.0)
MCHC: 30.2 g/dL (ref 30.0–36.0)
MCV: 86.5 fL (ref 78.0–100.0)
Platelets: 171 10*3/uL (ref 150–400)
RBC: 3.33 MIL/uL — ABNORMAL LOW (ref 4.22–5.81)
RDW: 16.2 % — AB (ref 11.5–15.5)
WBC: 4.3 10*3/uL (ref 4.0–10.5)

## 2014-04-29 LAB — MAGNESIUM: Magnesium: 2.6 mg/dL — ABNORMAL HIGH (ref 1.5–2.5)

## 2014-04-29 LAB — HEPARIN LEVEL (UNFRACTIONATED): Heparin Unfractionated: 0.52 IU/mL (ref 0.30–0.70)

## 2014-04-29 MED ORDER — OXYCODONE HCL 5 MG PO TABS
5.0000 mg | ORAL_TABLET | ORAL | Status: DC | PRN
Start: 2014-04-29 — End: 2014-05-02
  Administered 2014-04-29 – 2014-05-01 (×4): 5 mg via ORAL
  Filled 2014-04-29 (×6): qty 1

## 2014-04-29 MED ORDER — PREDNISONE 10 MG PO TABS
10.0000 mg | ORAL_TABLET | Freq: Every day | ORAL | Status: DC
  Administered 2014-04-29 – 2014-05-12 (×14): 10 mg via ORAL
  Filled 2014-04-29 (×15): qty 1

## 2014-04-29 NOTE — Progress Notes (Signed)
PULMONARY / CRITICAL CARE MEDICINE   Name: Jon Lozano MRN: [id number] DOB: 11-21-1940    ADMISSION DATE:  04/26/2014 CONSULTATION DATE:  04/26/2014  REFERRING MD :  EDP  CHIEF COMPLAINT:  Hypotension, AMS  BRIEF PATIENT DESCRIPTION:  73 y.o. M who underwent back surgery 01/04/14, complicated by hardware infection with MRSA, VRE, and proteus bacteremia.  He was initially on [facility], then d/c'd to SNF on 4/29.  Hardware was removed and he was readmitted to [facility] on 5/11.  Course further complicated by Klebsiella PNA on sputum culture.  On 6/10, was transferred to CIR then 6/15 developed new fever, altered mental status, lethargy.  PCCM was consulted.  SIGNIFICANT EVENTS: 2/23 - back surgery. 4/29 - d/c'd to SNF. 5/11 - admitted to [facility] (after hardware removal). 6/10 - transferred to CIR. 6/15 - developed fever to 102, tachycardia, hypoxia, AMS, lethargy. 6/17 - cardiology consulted 6/18 - off pressors, transfer to telemetry  STUDIES:  6/10 Doppler legs >> partial acute DVT Rt common femoral vein 6/16 Echo >> mild LVH, EF 65 to 70%, grade 3 diastolic dysfx, mild AS, mild/mod MS, mod LA dilation 6/16 CT chest >> no PE, atherosclerosis, emphysema with interstitial fibrosis, small pericardial effusions, Rt base consolidation, foci of calcification in pericardium  LINES / TUBES: Right PICC >>>  CULTURES: Blood 4/1 >>> neg Blood 5/26 >>> neg Blood ? Date >>> POS MRSA, VRE, PROTEUS. Sputum ? Date >>> POS KLEBSIELLA. Blood 6/15 >>> Urine 6/15 >>> negative  ANTIBIOTICS: Cefepime ? >>> 5/15  Daptomycin >>> (tentatively through 6/23 per ID) Meropenem >>> (tentatively through 6/23 per ID)  SUBJECTIVE:  Off pressors.  Denies cough, chest pain, abdominal pain.  Tolerating diet.  VITAL SIGNS: Temp:  [94.5 F (34.7 C)-97.5 F (36.4 C)] 97 F (36.1 C) (06/18 0745) Pulse Rate:  [39-123] 123 (06/18 0800) Resp:  [12-25] 19 (06/18 0800) BP: (77-140)/(46-91) 118/84 mmHg (06/18 0800) SpO2:   [92 %-100 %] 93 % (06/18 0800) Weight:  [194 lb 3.6 oz (88.1 kg)] 194 lb 3.6 oz (88.1 kg) (06/18 0500) INTAKE / OUTPUT: Intake/Output     06/17 0701 - 06/18 0700 06/18 0701 - 06/19 0700   P.O. 560 120   I.V. (mL/kg) 2443.5 (27.7) 94.5 (1.1)   IV Piggyback 610    Total Intake(mL/kg) 3613.5 (41) 214.5 (2.4)   Urine (mL/kg/hr) 1580 (0.7)    Stool 2 (0)    Total Output 1582     Net +2031.5 +214.5          PHYSICAL EXAMINATION: General: no distress Neuro: alert, follows commands, moves extremities HEENT: no sinus tenderness Cardiovascular: irregular, 2/6 SM Lungs: decreased breath sounds at bases, no wheeze Abdomen: soft, non tender Musculoskeletal: no edema Skin: no rashes  LABS: PULMONARY  Recent Labs Lab 04/26/14 2135  PHART 7.433  PCO2ART 48.6*  PO2ART 134.0*  HCO3 32.5*  TCO2 34  O2SAT 99.0   CBC  Recent Labs Lab 04/27/14 0500 04/28/14 0240 04/29/14 0412  HGB 8.2* 8.5* 8.7*  HCT 27.0* 27.4* 28.8*  WBC 7.5 5.0 4.3  PLT 165 153 171   CHEMISTRY  Recent Labs Lab 04/25/14 1805 04/26/14 2345 04/27/14 0500 04/28/14 0240 04/29/14 0412  NA 138 142 144 138 144  K 4.1 3.9 3.6* 3.1* 4.5  CL 96 98 104 100 108  CO2 28 31 30 25 25   GLUCOSE 174* 93 117* 135* 134*  BUN 18 18 16 13 12   CREATININE 0.75 0.77 0.63 0.50 0.63  CALCIUM 9.4 8.8  8.0* 8.3* 8.6  MG  --  2.3 2.2 2.3 2.6*  PHOS  --  3.8 3.3 2.0*  --    Estimated Creatinine Clearance: 91.4 ml/min (by C-G formula based on Cr of 0.63).   LIVER  Recent Labs Lab 04/25/14 1805  AST 26  ALT 14  ALKPHOS 152*  BILITOT 0.8  PROT 6.7  ALBUMIN 2.7*   INFECTIOUS  Recent Labs Lab 04/26/14 2345 04/27/14 04/27/14 0500  LATICACIDVEN  --  1.6 0.7  PROCALCITON 0.11  --  <0.10   ENDOCRINE CBG (last 3)   Recent Labs  04/28/14 1645 04/28/14 2154 04/29/14 0740  GLUCAP 152* 129* 114*   IMAGING x48h  Ct Angio Chest Pe W/cm &/or Wo Cm  04/27/2014   CLINICAL DATA:  Right lower extremity deep venous  thrombosis  EXAM: CT ANGIOGRAPHY CHEST WITH CONTRAST  TECHNIQUE: Multidetector CT imaging of the chest was performed using the standard protocol during bolus administration of intravenous contrast. Multiplanar CT image reconstructions and MIPs were obtained to evaluate the vascular anatomy.  CONTRAST:  32mL OMNIPAQUE IOHEXOL 350 MG/ML SOLN  COMPARISON:  Chest CT February 04, 2014 and chest radiograph April 27, 2014  FINDINGS: There is no demonstrable pulmonary embolus. There is atherosclerotic change in the aorta. There is no demonstrable thoracic aortic aneurysm or dissection.  There is underlying emphysema with areas of interstitial fibrosis. There are small pleural effusions with what appears to be some interstitial edema superimposed on interstitial fibrotic change. There is an area of opacity in the posterior segment right upper lobe which appears to represent rounded atelectasis. There may be some superimposed pneumonia in this area. There is an area of airspace consolidation in the posterior right base which appears new from the prior study and is felt to represent localized pneumonia in this area.  There is a small pericardial effusion. There are scattered foci of calcification within the pericardium. This finding is stable. The pericardium is also thickened on the left side with relative straightening of the left lateral heart border. There is the aortic valve and mitral annular calcification as well, stable from prior study.  There is no appreciable thoracic adenopathy. A borderline prominent superior right paratracheal lymph node is stable and most likely of reactive etiology.  The visualized upper abdominal structures appear normal except for atherosclerotic change. There are no blastic or lytic bone lesions. Thyroid appears normal.  Review of the MIP images confirms the above findings.  IMPRESSION: No demonstrable pulmonary embolus.  Evidence of congestive heart failure superimposed on interstitial  fibrosis. Note that there is a small pericardial effusion as well as scattered areas of pericardial calcification on the left. There is also extensive aortic valve and mitral annulus calcification, stable. 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 There appears to be some straightening of the lateral left heart border near the pericardium, a finding that could be indicative of constrictive pericarditis. Echocardiography could be helpful to further assess in this regard. Note that this appearance is essentially unchanged from prior CT examination.  Suspect rounded atelectasis in the posterior segment right upper lobe. There may be some superimposed infiltrate in this area. This appearance suggesting rounded atelectasis was also present on the previous study and is stable. There is also an area of localized pneumonia in the right base posteriorly.   Electronically Signed   By: Bretta Bang M.D.   On: 04/27/2014 10:28   Dg Chest Port 1 View  04/29/2014  CLINICAL DATA:  Followup pneumonia  EXAM: PORTABLE CHEST - 1 VIEW  COMPARISON:  04/27/2014  FINDINGS: Mild left lower lobe atelectasis/ infiltrate unchanged. Right lung remains clear. Negative for heart failure or effusion. Right arm PICC tip in the SVC.  IMPRESSION: No significant change left lower lobe atelectasis/infiltrate   Electronically Signed   By: Marlan Palau M.D.   On: 04/29/2014 08:04      ASSESSMENT / PLAN:  PULMONARY A: Atelectasis. HCAP. Hx of COPD/emphysema. P:   Bronchial hygiene F/u CXR intermittently Oxygen to keep SpO2 > 92% BD's prn  CARDIOVASCULAR A:  Septic shock >> off pressors 6/18. Rt leg DVT. A fib, valvular heart disease, pericardial calcification, chronic diastolic heart failure. P:  Continue IV fluids Heparin gtt per pharmacy >> will need to d/w cardiology about optimal anti-coagulation regimen with a fib and DVT ASA Hold lasix, plavix for now  RENAL A:   Hypokalemia >> improved. Urinary retention P:   F/u and replace electrolytes as needed Resumed flomax Keep foley in for now  GASTROINTESTINAL A:   Nutrition. GERD. P:   Regular diet Continue protonix  HEMATOLOGIC A:  Mild anemia. P:  F/u CBC Transfuse for Hb < 7  INFECTIOUS A:   MRSA, VRE, Proteus Bacteremia. HCAP. P:   Continue current Abx.  ENDOCRINE A:   Hyperglycemia. P:   SSI  NEUROLOGIC A:   Acute encephalopathy >> resolved 6/17. P:   Resumed remeron, lyrica 6/17 Change oxycodone to prn  RHEUMATOLOGY A: Hx of Rheumatoid arthritis on chronic prednisone. P: D/c solucortef and resume prednisone 6/18  Transfer to telemetry 6/18.  Transfer to [facility] for 6/19 and PCCM sign off.  Coralyn Helling, MD [facility] Pulmonary/Critical Care 04/29/2014, 8:50 AM Pager:  [phone] After 3pm call: [phone]

## 2014-04-29 NOTE — Progress Notes (Signed)
ANTICOAGULATION CONSULT NOTE - Follow Up Consult  Pharmacy Consult for Heparin Indication: DVT  No Known Allergies  Patient Measurements: Height: 5\' 8"  (172.7 cm) Weight: 194 lb 3.6 oz (88.1 kg) IBW/kg (Calculated) : 68.4 Heparin Dosing Weight: 85kg  Vital Signs: Temp: 97 F (36.1 C) (06/18 0745) Temp src: Oral (06/18 0745) BP: 118/84 mmHg (06/18 0800) Pulse Rate: 123 (06/18 0800)  Labs:  Recent Labs  04/27/14 0500 04/27/14 1313 04/27/14 1435  04/27/14 2126  04/28/14 0240 04/28/14 1310 04/29/14 0412  HGB 8.2*  --   --   --   --   --  8.5*  --  8.7*  HCT 27.0*  --   --   --   --   --  27.4*  --  28.8*  PLT 165  --   --   --   --   --  153  --  171  HEPARINUNFRC  --   --   --   < >  --   < > 0.24* 0.42 0.52  CREATININE 0.63  --   --   --   --   --  0.50  --  0.63  TROPONINI  --  <0.30 <0.30  --  <0.30  --   --   --   --   < > = values in this interval not displayed.  Estimated Creatinine Clearance: 91.4 ml/min (by C-G formula based on Cr of 0.63).   Medications:  Heparin 1350 units/hr  Assessment: 72yom continues on heparin for RLE DVT andAfib. Heparin level (0.52) remains therapeutic on current rate - will continue and follow-up AM heparin level. Noted CCM plans to discuss oral anticoagulation with Cardiology. - H/H and Plts improving - No significant bleeding reported  Goal of Therapy:  Heparin level 0.3-0.7 units/ml Monitor platelets by anticoagulation protocol: Yes   Plan:  1. Continue heparin drip 1350 units/hr (13.5 ml/hr) 2. Follow-up AM heparin level 3. Follow-up oral anticoagulation discussion/inititation   05/01/14 Cleon Dew 04/29/2014,9:41 AM

## 2014-04-29 NOTE — Progress Notes (Signed)
eLink Physician-Brief Progress Note Patient Name: Jon Lozano DOB: [birth year] MRN: [id number]  Date of Service  04/29/2014   HPI/Events of Note   Has ben in A fib, now with periods of bradycardia. 50'-60's but with some periods in the high 30's . Has stable BP. Did received some pain medication earlier, has been sleeping  eICU Interventions  - ECG now - check electrolytes early - follow   Intervention Category Intermediate Interventions: Arrhythmia - evaluation and management  BYRUM,ROBERT S. 04/29/2014, 3:49 AM

## 2014-04-29 NOTE — Evaluation (Signed)
Physical Therapy Evaluation Patient Details Name: Jon Lozano MRN: [id number] DOB: 05/05/1941 Today's Date: 04/29/2014   History of Present Illness  Pt originally had back surgery in [place] in February complicated by hardware infection with MRSA, VRE, and proteus bacteremia. Pt was transferred to [facility] and then dc'd to SNF on 03/10/14. Pt readmitted to [facility] on 03/22/14 after removal of hardware. Transferred to CIR on 04/21/14. Developed new fever, lethargy, and altered mental status on 04/26/14 and transferred to ICU.Pt with HCAP and septic shock.  Clinical Impression  Pt admitted with above. Pt currently with functional limitations due to the deficits listed below (see PT Problem List).  Pt will benefit from skilled PT to increase their independence and safety with mobility to allow discharge to the venue listed below.        Follow Up Recommendations      Equipment Recommendations  Hospital bed;Other (comment) (mechanical lift)    Recommendations for Other Services       Precautions / Restrictions Precautions Precautions: Fall      Mobility  Bed Mobility Overal bed mobility: Needs Assistance Bed Mobility: Rolling Rolling: +2 for physical assistance;Total assist         General bed mobility comments: Assist for all facets.  Transfers                 General transfer comment: Maxisky from chair to bed.  Ambulation/Gait                Stairs            Wheelchair Mobility    Modified Rankin (Stroke Patients Only)       Balance Overall balance assessment: Needs assistance   Sitting balance-Leahy Scale: Zero Sitting balance - Comments: Pt required max A to bring trunk off back of chair. Pt leaning rt.                                     Pertinent Vitals/Pain Pt premedicated.    Home Living   Living Arrangements: Spouse/significant other Available Help at Discharge: Available 24 hours/day Type of Home: House Home  Access: Level entry (back door level entrance via golfcart per pt)     Home Layout: Multi-level;Able to live on main level with bedroom/bathroom (Plan is to live in ground level apartment of multilevel home.)        Prior Function Level of Independence: Needs assistance   Gait / Transfers Assistance Needed: Was requiring mechanical lift on rehab prior to transfer to acute. Prior to back surgery was amb with cane at times.           Hand Dominance   Dominant Hand: Left    Extremity/Trunk Assessment   Upper Extremity Assessment: Defer to OT evaluation           Lower Extremity Assessment: RLE deficits/detail;LLE deficits/detail RLE Deficits / Details: grossly 2/5 LLE Deficits / Details: grossly 2/5     Communication   Communication: No difficulties  Cognition Arousal/Alertness: Awake/alert Behavior During Therapy: WFL for tasks assessed/performed Overall Cognitive Status: Within Functional Limits for tasks assessed                      General Comments      Exercises General Exercises - Lower Extremity Long Arc QuadBarbaraann Boys;Both;10 reps;Seated Hip Flexion/Marching: AAROM;Both;10 reps;Seated      Assessment/Plan    PT  Assessment Patient needs continued PT services  PT Diagnosis Difficulty walking;Generalized weakness   PT Problem List Decreased strength;Decreased activity tolerance;Decreased balance;Decreased mobility;Decreased knowledge of use of DME  PT Treatment Interventions DME instruction;Functional mobility training;Therapeutic activities;Therapeutic exercise;Balance training;Neuromuscular re-education;Patient/family education   PT Goals (Current goals can be found in the Care Plan section) Acute Rehab PT Goals Patient Stated Goal: Get better PT Goal Formulation: With patient Time For Goal Achievement: 05/13/14 Potential to Achieve Goals: Fair    Frequency Min 3X/week   Barriers to discharge        Co-evaluation                End of Session Equipment Utilized During Treatment: Oxygen;Other (comment) (maxisky) Activity Tolerance: Patient limited by fatigue Patient left: in bed;with call bell/phone within reach;with nursing/sitter in room Nurse Communication: Mobility status;Need for lift equipment         Time: 1152-1207 PT Time Calculation (min): 15 min   Charges:   PT Evaluation $Initial PT Evaluation Tier I: 1 Procedure PT Treatments $Therapeutic Exercise: 8-22 mins   PT G Codes:          MAYCOCK,CARY May 07, 2014, 1:59 PM  [facility] PT [phone]

## 2014-04-29 NOTE — Progress Notes (Signed)
Pt's HR occasionally drops to the high 40's low 50's while sleeping but does not stay there, BP stable at 110/79 (86), notified CCM MD on call tonight. No interventions at this time will continue to monitor.

## 2014-04-29 NOTE — Progress Notes (Signed)
Patient tx to 3E17 on bed and tele, with his belongings.  Called wife to update her.

## 2014-04-30 DIAGNOSIS — I9589 Other hypotension: Secondary | ICD-10-CM

## 2014-04-30 DIAGNOSIS — Z1639 Resistance to other specified antimicrobial drug: Secondary | ICD-10-CM

## 2014-04-30 DIAGNOSIS — B952 Enterococcus as the cause of diseases classified elsewhere: Secondary | ICD-10-CM

## 2014-04-30 LAB — HEPARIN LEVEL (UNFRACTIONATED): HEPARIN UNFRACTIONATED: 0.37 [IU]/mL (ref 0.30–0.70)

## 2014-04-30 LAB — CBC
HCT: 31.8 % — ABNORMAL LOW (ref 39.0–52.0)
Hemoglobin: 9.7 g/dL — ABNORMAL LOW (ref 13.0–17.0)
MCH: 26.3 pg (ref 26.0–34.0)
MCHC: 30.5 g/dL (ref 30.0–36.0)
MCV: 86.2 fL (ref 78.0–100.0)
Platelets: 176 10*3/uL (ref 150–400)
RBC: 3.69 MIL/uL — ABNORMAL LOW (ref 4.22–5.81)
RDW: 16.2 % — ABNORMAL HIGH (ref 11.5–15.5)
WBC: 9.7 10*3/uL (ref 4.0–10.5)

## 2014-04-30 LAB — GLUCOSE, CAPILLARY
GLUCOSE-CAPILLARY: 99 mg/dL (ref 70–99)
Glucose-Capillary: 79 mg/dL (ref 70–99)
Glucose-Capillary: 105 mg/dL — ABNORMAL HIGH (ref 70–99)
Glucose-Capillary: 115 mg/dL — ABNORMAL HIGH (ref 70–99)

## 2014-04-30 LAB — BASIC METABOLIC PANEL
BUN: 13 mg/dL (ref 6–23)
CALCIUM: 8.9 mg/dL (ref 8.4–10.5)
CO2: 25 mEq/L (ref 19–32)
Chloride: 107 mEq/L (ref 96–112)
Creatinine, Ser: 0.78 mg/dL (ref 0.50–1.35)
GFR calc Af Amer: >90 mL/min (ref >90)
GFR, EST NON AFRICAN AMERICAN: 89 mL/min — AB (ref >90)
Glucose, Bld: 86 mg/dL (ref 70–99)
Potassium: 4.3 mEq/L (ref 3.7–5.3)
SODIUM: 144 meq/L (ref 137–147)

## 2014-04-30 LAB — PRO B NATRIURETIC PEPTIDE: Pro B Natriuretic peptide (BNP): 1579 pg/mL — ABNORMAL HIGH (ref 0–125)

## 2014-04-30 MED ORDER — FUROSEMIDE 10 MG/ML IJ SOLN
20.0000 mg | Freq: Two times a day (BID) | INTRAMUSCULAR | Status: AC
  Administered 2014-04-30 – 2014-05-01 (×3): 20 mg via INTRAVENOUS
  Filled 2014-04-30 (×2): qty 2

## 2014-04-30 MED ORDER — LEVALBUTEROL HCL 0.63 MG/3ML IN NEBU
0.6300 mg | INHALATION_SOLUTION | Freq: Four times a day (QID) | RESPIRATORY_TRACT | Status: DC | PRN
  Administered 2014-04-30 – 2014-05-09 (×4): 0.63 mg via RESPIRATORY_TRACT
  Filled 2014-04-30 (×4): qty 3

## 2014-04-30 NOTE — Progress Notes (Signed)
SUBJECTIVE:  Complains of SOB  OBJECTIVE:   Vitals:   Filed Vitals:   04/29/14 1323 04/29/14 2146 04/30/14 0240 04/30/14 0704  BP: 117/72 112/64 110/71 131/87  Pulse: 83 80 88 113  Temp:  98.1 F (36.7 C) 97.1 F (36.2 C) 97.3 F (36.3 C)  TempSrc:  Oral Oral Axillary  Resp: 16 18 20 20   Height: 5\' 8"  (1.727 m)     Weight: 190 lb 7.6 oz (86.4 kg)     SpO2: 100% 100% 94% 94%   I&O's:   Intake/Output Summary (Last 24 hours) at 04/30/14 0840 Last data filed at 04/30/14 0251  Gross per 24 hour  Intake 1738.5 ml  Output    476 ml  Net 1262.5 ml   TELEMETRY: Reviewed telemetry pt in atrial fibrillation with HR 101bpm:     PHYSICAL EXAM General: Well developed, well nourished, in no acute distress Head: Eyes PERRLA, No xanthomas.   Normal cephalic and atramatic  Lungs:   Diffuse expiratory and inspiratory wheeze. Heart:   Irregularly irregular S1 S2 Pulses are 2+ & equal. Abdomen: Bowel sounds are positive, abdomen soft and non-tender without masses  Extremities:   No clubbing, cyanosis or edema.  DP +1 Neuro: Alert and oriented X 3. Psych:  Good affect, responds appropriately   LABS: Basic Metabolic Panel:  Recent Labs  46/80/32 0240 04/29/14 0412 04/30/14 0658  NA 138 144 144  K 3.1* 4.5 4.3  CL 100 108 107  CO2 25 25 25   GLUCOSE 135* 134* 86  BUN 13 12 13   CREATININE 0.50 0.63 0.78  CALCIUM 8.3* 8.6 8.9  MG 2.3 2.6*  --   PHOS 2.0*  --   --    Liver Function Tests: No results found for this basename: AST, ALT, ALKPHOS, BILITOT, PROT, ALBUMIN,  in the last 72 hours No results found for this basename: LIPASE, AMYLASE,  in the last 72 hours CBC:  Recent Labs  04/29/14 0412 04/30/14 0658  WBC 4.3 9.7  HGB 8.7* 9.7*  HCT 28.8* 31.8*  MCV 86.5 86.2  PLT 171 176   Cardiac Enzymes:  Recent Labs  04/27/14 1313 04/27/14 1435 04/27/14 2126  TROPONINI <0.30 <0.30 <0.30   BNP: No components found with this basename: POCBNP,  D-Dimer: No results  found for this basename: DDIMER,  in the last 72 hours Hemoglobin A1C: No results found for this basename: HGBA1C,  in the last 72 hours Fasting Lipid Panel: No results found for this basename: CHOL, HDL, LDLCALC, TRIG, CHOLHDL, LDLDIRECT,  in the last 72 hours Thyroid Function Tests: No results found for this basename: TSH, T4TOTAL, FREET3, T3FREE, THYROIDAB,  in the last 72 hours Anemia Panel: No results found for this basename: VITAMINB12, FOLATE, FERRITIN, TIBC, IRON, RETICCTPCT,  in the last 72 hours Coag Panel:   Lab Results  Component Value Date   INR 1.29 03/22/2014   INR 1.23 02/06/2014    RADIOLOGY: Dg Chest 1 View  04/22/2014   CLINICAL DATA:  PICC line placement  EXAM: CHEST - 1 VIEW  COMPARISON:  Portable exam 1548 hr compared to 03/22/2014  FINDINGS: RIGHT arm PICC line tip projects over SVC.  Minimal enlargement of cardiac silhouette.  Mediastinal contours and pulmonary vascularity normal.  Mild persistent LEFT basilar infiltrate.  Improving aeration at RIGHT base.  No pleural effusion or pneumothorax.  IMPRESSION: Tip of RIGHT arm PICC line projects over mid SVC.  Persistent LEFT basilar and improving RIGHT basilar pulmonary infiltrates.  Electronically Signed   By: Ulyses Southward M.D.   On: 04/22/2014 17:14   Dg Chest 2 View  04/26/2014   CLINICAL DATA:  Hypoxia.  Question CHF  EXAM: CHEST  2 VIEW  COMPARISON:  04/25/2014  FINDINGS: Right arm PICC tip in the SVC unchanged.  Diffusely increased lung markings unchanged from the prior study. Question mild CHF versus chronic lung disease. No significant effusion.  IMPRESSION: Prominent lung markings are stable and may be due to chronic CHF or chronic lung disease and fibrosis. No superimposed edema or effusion.   Electronically Signed   By: Marlan Palau M.D.   On: 04/26/2014 17:15   Dg Chest 2 View  04/25/2014   CLINICAL DATA:  Short of breath.  Fever.  Back pain.  EXAM: CHEST  2 VIEW  COMPARISON:  04/22/2014.  FINDINGS: Right upper  extremity PICC is unchanged. The cardiopericardial silhouette remains enlarged. Bilateral basilar predominant airspace disease with diffuse interstitial prominence. Compared to the prior exam, there is no interval change aside from lower lung volumes. No pleural effusion.  IMPRESSION: 1. Unchanged right upper extremity PICC with the tip in the mid SVC. 2. Lower lung volumes with interstitial and alveolar basilar opacities.   Electronically Signed   By: Andreas Newport M.D.   On: 04/25/2014 19:27   Ct Angio Chest Pe W/cm &/or Wo Cm  04/27/2014   CLINICAL DATA:  Right lower extremity deep venous thrombosis  EXAM: CT ANGIOGRAPHY CHEST WITH CONTRAST  TECHNIQUE: Multidetector CT imaging of the chest was performed using the standard protocol during bolus administration of intravenous contrast. Multiplanar CT image reconstructions and MIPs were obtained to evaluate the vascular anatomy.  CONTRAST:  30mL OMNIPAQUE IOHEXOL 350 MG/ML SOLN  COMPARISON:  Chest CT February 04, 2014 and chest radiograph April 27, 2014  FINDINGS: There is no demonstrable pulmonary embolus. There is atherosclerotic change in the aorta. There is no demonstrable thoracic aortic aneurysm or dissection.  There is underlying emphysema with areas of interstitial fibrosis. There are small pleural effusions with what appears to be some interstitial edema superimposed on interstitial fibrotic change. There is an area of opacity in the posterior segment right upper lobe which appears to represent rounded atelectasis. There may be some superimposed pneumonia in this area. There is an area of airspace consolidation in the posterior right base which appears new from the prior study and is felt to represent localized pneumonia in this area.  There is a small pericardial effusion. There are scattered foci of calcification within the pericardium. This finding is stable. The pericardium is also thickened on the left side with relative straightening of the left  lateral heart border. There is the aortic valve and mitral annular calcification as well, stable from prior study.  There is no appreciable thoracic adenopathy. A borderline prominent superior right paratracheal lymph node is stable and most likely of reactive etiology.  The visualized upper abdominal structures appear normal except for atherosclerotic change. There are no blastic or lytic bone lesions. Thyroid appears normal.  Review of the MIP images confirms the above findings.  IMPRESSION: No demonstrable pulmonary embolus.  Evidence of congestive heart failure superimposed on interstitial fibrosis. Note that there is a small pericardial effusion as well as scattered areas of pericardial calcification on the left. There is also extensive aortic valve and mitral annulus calcification, stable. 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 There appears to be some straightening of the lateral left heart  border near the pericardium, a finding that could be indicative of constrictive pericarditis. Echocardiography could be helpful to further assess in this regard. Note that this appearance is essentially unchanged from prior CT examination.  Suspect rounded atelectasis in the posterior segment right upper lobe. There may be some superimposed infiltrate in this area. This appearance suggesting rounded atelectasis was also present on the previous study and is stable. There is also an area of localized pneumonia in the right base posteriorly.   Electronically Signed   By: Bretta Bang M.D.   On: 04/27/2014 10:28   Dg Chest Port 1 View  04/29/2014   CLINICAL DATA:  Followup pneumonia  EXAM: PORTABLE CHEST - 1 VIEW  COMPARISON:  04/27/2014  FINDINGS: Mild left lower lobe atelectasis/ infiltrate unchanged. Right lung remains clear. Negative for heart failure or effusion. Right arm PICC tip in the SVC.  IMPRESSION: No significant change left lower lobe atelectasis/infiltrate    Electronically Signed   By: Marlan Palau M.D.   On: 04/29/2014 08:04   Dg Chest Port 1 View  04/27/2014   CLINICAL DATA:  Followup prominent lung markings  EXAM: PORTABLE CHEST - 1 VIEW  COMPARISON:  Portable exam 0620 hr compared to 04/26/2014  FINDINGS: RIGHT arm PICC line tip projects over SVC.  Rotated to the RIGHT.  Enlargement of cardiac silhouette.  Mediastinal contours and pulmonary vascularity normal.  Persistent accentuation of interstitial markings similar to previous exam.  Minimal infiltrate or atelectasis at LEFT base.  Lungs otherwise clear.  No pleural effusion or pneumothorax.  Chronic RIGHT rotator cuff tear.  IMPRESSION: Mild enlargement of cardiac silhouette.  Chronic accentuation of interstitial markings.  Minimal atelectasis versus infiltrate at LEFT base.   Electronically Signed   By: Ulyses Southward M.D.   On: 04/27/2014 08:14    ASSESSMENT AND PLAN:  Atrial fibrillation: This may be related to his mitral stenosis. He has evidence of left atrial enlargement. Left ventricular function is preserved. When he is more stable from a medical standpoint, would have to readdress the benefits versus risks of anticoagulation. Clearly, his CHADS score is high and he is at fairly high risk for thromboembolic event off of anticoagulation. His HR control is borderline in the upper 90's-low 100's.  Would avoid BB in setting of COPD and active wheezing.  If BP allows may try low dose Cardizem.  Continue IV heparin gtt for now.  Will address long term anticoagulation once all procedures completed. ? 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  He has a history of pericardial drainage in 2007.  His primary Cardiologist is in [facility].  Per Dr. Hoyle Barr review,on echo in the four-chamber view, there is some suggestion of ventricular interdependence and abnormal septal motion. It is difficult to quantify as the recorded loop is not long  enough. Now that he is off of pressors, it would be helpful to have a longer echo loop with respirometer recorded by echocardiogram to see the interventricular septal motion, and how it varies with respiration. Will need to try to get HR better controlled before repeat echo.  At the moment, he is not a candidate for pericardial stripping due to his other comorbidities. Discussed option of Cardiac MRI with Dr. Delton See but she felt right heart cath would be best option to further evaluate for constriction.  He is still SOB and will have a hard time lying flat at this time so needs to be tuned up over  the weekend from a respiratory standpoint first.  I will also work on getting old records of pericardial drainage from his cardiologist in [place] Diastolic heart failure: Fluid overload likely related to valvular heart disease as well as LV diastolic dysfunction from atrial fibrillation . Diuretics have been on hold due to low BP.  Chest xray did not show edema.  Will check a BNP.  He has a significant amount of wheezing on exam today from HCAP/COPD which needs to be treated per IM.   Quintella Reichert, MD  04/30/2014  8:40 AM

## 2014-04-30 NOTE — Progress Notes (Signed)
Utilization Review Completed Camellia J. Wood, RN, BSN, NCM [phone]  

## 2014-04-30 NOTE — Progress Notes (Signed)
Physical Therapy Treatment Patient Details Name: Jon Lozano MRN: [id number] DOB: [birth year] Today's Date: 22-May-2014    History of Present Illness Pt originally had back surgery in [place] in February complicated by hardware infection with MRSA, VRE, and proteus bacteremia. Pt was transferred to [facility] and then dc'd to SNF on 03/10/14. Pt readmitted to [facility] on 03/22/14 after removal of hardware. Transferred to CIR on 04/21/14. Developed new fever, lethargy, and altered mental status on 04/26/14 and transferred to ICU.Pt with HCAP and septic shock.    PT Comments    Session limited by pt fear, possibly of falling. Repeated that he was scared, when trying to encourage anterior lean. Total assist was required during all mobility and static sitting at EOB. Pt positioned on R side with pillow behind pt and between LE's. Will continue to follow and progress as able.   Follow Up Recommendations        Equipment Recommendations       Recommendations for Other Services       Precautions / Restrictions Precautions Precautions: Fall Restrictions Weight Bearing Restrictions: No    Mobility  Bed Mobility Overal bed mobility: Needs Assistance Bed Mobility: Rolling;Supine to Sit Rolling: +2 for physical assistance;Total assist   Supine to sit: +2 for physical assistance;Total assist     General bed mobility comments: Assist for all facets. Pt with significant posterior lean and had difficulty leaning shoulders anteriorly. Unable to static sit without total assist.   Transfers                 General transfer comment: Unable  Ambulation/Gait                 Stairs            Wheelchair Mobility    Modified Rankin (Stroke Patients Only)       Balance Overall balance assessment: Needs assistance Sitting-balance support: Feet supported;Bilateral upper extremity supported Sitting balance-Leahy Scale: Zero Sitting balance - Comments: Pt leaning rt.                             Cognition Arousal/Alertness: Awake/alert Behavior During Therapy: WFL for tasks assessed/performed Overall Cognitive Status: Within Functional Limits for tasks assessed                      Exercises Low Level/ICU Exercises Heel Slides: 10 reps;AAROM    General Comments        Pertinent Vitals/Pain Vitals stable throughout session.     Home Living                      Prior Function            PT Goals (current goals can now be found in the care plan section) Acute Rehab PT Goals Patient Stated Goal: Get better and stay better.    Frequency       PT Plan      Co-evaluation             End of Session           Time: 2355-7322 PT Time Calculation (min): 22 min  Charges:                       G Codes:      Ruthann Cancer May 22, 2014, 2:53 PM  Ruthann Cancer, PT, DPT Acute Rehabilitation Services Pager: [phone]

## 2014-04-30 NOTE — Progress Notes (Signed)
ANTIBIOTIC CONSULT NOTE - FOLLOW UP  Pharmacy Consult for Daptomycin + Meropenem Indication: Bacteremia/ hardware infection  No Known Allergies  Patient Measurements: Height: 5\' 8"  (172.7 cm) Weight: 190 lb 7.6 oz (86.4 kg) (Bedscale, pt unable to stand) IBW/kg (Calculated) : 68.4  Vital Signs: Temp: 97.3 F (36.3 C) (06/19 0704) Temp src: Axillary (06/19 0704) BP: 131/87 mmHg (06/19 0704) Pulse Rate: 113 (06/19 0704) Intake/Output from previous day: 06/18 0701 - 06/19 0700 In: 1953 [P.O.:580; I.V.:1173; IV Piggyback:200] Out: 511 [Urine:510; Stool:1] Intake/Output from this shift: Total I/O In: 240 [P.O.:240] Out: -   Labs:  Recent Labs  04/28/14 0240 04/29/14 0412 04/30/14 0658  WBC 5.0 4.3 9.7  HGB 8.5* 8.7* 9.7*  PLT 153 171 176  CREATININE 0.50 0.63 0.78   Estimated Creatinine Clearance: 90.6 ml/min (by C-G formula based on Cr of 0.78). No results found for this basename: VANCOTROUGH, 05/02/14, VANCORANDOM, GENTTROUGH, GENTPEAK, GENTRANDOM, TOBRATROUGH, TOBRAPEAK, TOBRARND, AMIKACINPEAK, AMIKACINTROU, AMIKACIN,  in the last 72 hours   Microbiology: Recent Results (from the past 720 hour(s))  CULTURE, BLOOD (ROUTINE X 2)     Status: None   Collection Time    04/06/14  3:30 PM      Result Value Ref Range Status   Specimen Description BLOOD LEFT ARM   Final   Special Requests BOTTLES DRAWN AEROBIC ONLY 3CC   Final   Culture  Setup Time     Final   Value: 04/06/2014 20:05     Performed at 04/08/2014   Culture     Final   Value: NO GROWTH 5 DAYS     Performed at [facility]   Report Status 04/12/2014 FINAL   Final  CULTURE, BLOOD (ROUTINE X 2)     Status: None   Collection Time    04/06/14  3:37 PM      Result Value Ref Range Status   Specimen Description BLOOD LEFT ARM   Final   Special Requests BOTTLES DRAWN AEROBIC ONLY 3CC   Final   Culture  Setup Time     Final   Value: 04/06/2014 20:05     Performed at 04/08/2014   Culture     Final   Value: NO GROWTH 5 DAYS     Performed at [facility]   Report Status 04/12/2014 FINAL   Final  MRSA PCR SCREENING     Status: None   Collection Time    04/22/14  9:44 AM      Result Value Ref Range Status   MRSA by PCR NEGATIVE  NEGATIVE Final   Comment:            The GeneXpert MRSA Assay (FDA     approved for NASAL specimens     only), is one component of a     comprehensive MRSA colonization     surveillance program. It is not     intended to diagnose MRSA     infection nor to guide or     monitor treatment for     MRSA infections.  CULTURE, BLOOD (ROUTINE X 2)     Status: None   Collection Time    04/25/14  6:05 PM      Result Value Ref Range Status   Specimen Description BLOOD LEFT ARM   Final   Special Requests BOTTLES DRAWN AEROBIC ONLY 10CC   Final   Culture  Setup Time     Final   Value:  04/26/2014 00:46     Performed at [facility]   Culture     Final   Value:        BLOOD CULTURE RECEIVED NO GROWTH TO DATE CULTURE WILL BE HELD FOR 5 DAYS BEFORE ISSUING A FINAL NEGATIVE REPORT     Performed at [facility]   Report Status PENDING   Incomplete  URINE CULTURE     Status: None   Collection Time    04/25/14  6:12 PM      Result Value Ref Range Status   Specimen Description URINE, CLEAN CATCH   Final   Special Requests NONE   Final   Culture  Setup Time     Final   Value: 04/25/2014 18:51     Performed at [facility] Count     Final   Value: NO GROWTH     Performed at [facility]   Culture     Final   Value: NO GROWTH     Performed at [facility]   Report Status 04/26/2014 FINAL   Final  CULTURE, BLOOD (ROUTINE X 2)     Status: None   Collection Time    04/25/14  6:12 PM      Result Value Ref Range Status   Specimen Description BLOOD LEFT HAND   Final   Special Requests BOTTLES DRAWN AEROBIC ONLY 10CC   Final   Culture  Setup Time     Final   Value: 04/26/2014 00:46      Performed at [facility]   Culture     Final   Value:        BLOOD CULTURE RECEIVED NO GROWTH TO DATE CULTURE WILL BE HELD FOR 5 DAYS BEFORE ISSUING A FINAL NEGATIVE REPORT     Performed at [facility]   Report Status PENDING   Incomplete  URINE CULTURE     Status: None   Collection Time    04/26/14 10:30 PM      Result Value Ref Range Status   Specimen Description URINE, CLEAN CATCH   Final   Special Requests NONE   Final   Culture  Setup Time     Final   Value: 04/27/2014 05:27     Performed at [facility] Count     Final   Value: NO GROWTH     Performed at [facility]   Culture     Final   Value: NO GROWTH     Performed at [facility]   Report Status 04/28/2014 FINAL   Final  CULTURE, BLOOD (ROUTINE X 2)     Status: None   Collection Time    04/26/14 11:45 PM      Result Value Ref Range Status   Specimen Description BLOOD LEFT ARM   Final   Special Requests BOTTLES DRAWN AEROBIC ONLY 2CC   Final   Culture  Setup Time     Final   Value: 04/27/2014 04:31     Performed at [facility]   Culture     Final   Value:        BLOOD CULTURE RECEIVED NO GROWTH TO DATE CULTURE WILL BE HELD FOR 5 DAYS BEFORE ISSUING A FINAL NEGATIVE REPORT     Performed at [facility]   Report Status PENDING   Incomplete  CULTURE, BLOOD (ROUTINE X 2)     Status:  None   Collection Time    04/27/14 12:00 AM      Result Value Ref Range Status   Specimen Description BLOOD LEFT HAND   Final   Special Requests BOTTLES DRAWN AEROBIC ONLY 1CC   Final   Culture  Setup Time     Final   Value: 04/27/2014 04:29     Performed at [facility]   Culture     Final   Value:        BLOOD CULTURE RECEIVED NO GROWTH TO DATE CULTURE WILL BE HELD FOR 5 DAYS BEFORE ISSUING A FINAL NEGATIVE REPORT     Performed at [facility]   Report Status PENDING   Incomplete    Anti-infectives   Start     Dose/Rate Route Frequency Ordered Stop    04/27/14 1200  DAPTOmycin (CUBICIN) 500 mg in sodium chloride 0.9 % IVPB     500 mg 220 mL/hr over 30 Minutes Intravenous Every 24 hours 04/26/14 2227     04/26/14 2230  meropenem (MERREM) 1 g in sodium chloride 0.9 % 100 mL IVPB     1 g 200 mL/hr over 30 Minutes Intravenous 3 times per day 04/26/14 2227        Assessment: Abx continuation from [facility] 73 y/o M underwent back surgery 01/04/14 complicated by 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 At SNF he developed purulent drainage on 03/10/14 with wound cultures positive for VRE. Hardware was removed by Dr. Alger Memos and [facility] placed with recommendations for daptomycin/cefepime for 8 weeks of antibiotic therapy. Cefepime changed to Merrem on 03/26/14 for Kleb. Plan to con't abx through 05/04/14. The patient's last CK on 6/14 was 39, will plan to check weekly CK levels while on Dapto. Renal function stable - doses appropriate.  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 Continue Daptomycin 500 mg (~6 mg/kg) IV every 24 hours 2. Continue Meropenem 1g IV every 8 hours 3. Weekly CK levels on Tuesdays while on Dapto 4. Will continue to monitor renal function for any necessary dose adjustments  Lysle Pearl, PharmD, BCPS Pager # [phone] 04/30/2014 10:25 AM

## 2014-04-30 NOTE — Progress Notes (Signed)
ANTICOAGULATION CONSULT NOTE - Follow Up Consult  Pharmacy Consult for Heparin Indication: DVT, afib  No Known Allergies  Patient Measurements: Height: 5\' 8"  (172.7 cm) Weight: 190 lb 7.6 oz (86.4 kg) (Bedscale, pt unable to stand) IBW/kg (Calculated) : 68.4 Heparin Dosing Weight: 85kg  Vital Signs: Temp: 97.3 F (36.3 C) (06/19 0704) Temp src: Axillary (06/19 0704) BP: 131/87 mmHg (06/19 0704) Pulse Rate: 113 (06/19 0704)  Labs:  Recent Labs  04/27/14 1313 04/27/14 1435  04/27/14 2126  04/28/14 0240 04/28/14 1310 04/29/14 0412 04/30/14 0658  HGB  --   --   --   --   < > 8.5*  --  8.7* 9.7*  HCT  --   --   --   --   --  27.4*  --  28.8* 31.8*  PLT  --   --   --   --   --  153  --  171 176  HEPARINUNFRC  --   --   < >  --   < > 0.24* 0.42 0.52 0.37  CREATININE  --   --   --   --   --  0.50  --  0.63 0.78  TROPONINI <0.30 <0.30  --  <0.30  --   --   --   --   --   < > = values in this interval not displayed.  Estimated Creatinine Clearance: 90.6 ml/min (by C-G formula based on Cr of 0.78).  Medications:  Heparin 1350 units/hr  Assessment: 72yom continues on heparin for RLE DVT andAfib. Heparin level remains therapeutic on current rate. CBC is stable and no bleeding noted. Oral anticoagulation has not been decided upon yet.   Goal of Therapy:  Heparin level 0.3-0.7 units/ml Monitor platelets by anticoagulation protocol: Yes   Plan:  1. Continue heparin drip 1350 units/hr (13.5 ml/hr) 2. F/u AM heparin level and CBC 3. F/u plan for oral anticoag  05/02/14, PharmD, BCPS Pager # [phone] 04/30/2014 10:27 AM

## 2014-04-30 NOTE — Progress Notes (Signed)
Rehab admissions - This pt is known to inpatient rehab as he was admitted to [facility] on 04-21-14, then discharged to acute care for medical reasons.   I was contacted by acute care PT about pt's progress with therapy. I then discussed pt's case with Dr. Riley Kill, rehab MD. Dr. Riley Kill feels that skilled nursing is now appropriate.  I will communicate this recommendation to Crystal, case manager who will update Lupita Leash, Child psychotherapist as well.   Thanks,  Juliann Mule, PT Rehabilitation Admissions Coordinator [phone]

## 2014-04-30 NOTE — Progress Notes (Signed)
[facility] PROGRESS NOTE  Jon Lozano IRC:[id number] DOB: July 01, 1941 DOA: 04/26/2014 PCP: Pcp Not In System  Assessment/Plan: 1-acute resp failure with hypoxia: due to HCPA -much better  -patient on 2L oxygen currently and with good O2 sat -continue antibiotics as dictated/recommended by ID  2-COPD/Emphysema: currently no wheezing -per pulmonary service plan is to continue bronchial hygiene and PRN bronchodilators   3-septic shock: now resolved -BP stable -patient off pressors -will monitor  4-atrial fibrillation and pericardial calcification: -per cardiology -continue heparin drip -BP was initially to soft for any antiarrythmic agent; will follow rec's -plan is once more stable, pursuit right heart cath to better assess/ evaluate constriction  5-RLE DVT: continue heparin drip -eventually will benefit of xarelto therapy  6-Urinary retention: most likely associated with BPH -started on flomax  -will continue foley for now  7-GERD: continue PPI  8-RA: continue prednisone  9-acute toxic encephalopathy: due to infection -now resolved -patient AAOX3.  10-dysphagia/deconditioning: as per SPT rec's will continue nectar thick liquids and heart healthy diet. -patient to be discharge to SNF vs LTAC for rehabilitation  Code Status: full  Family Communication: no family at bedside Disposition Plan: SNF vs LTAC when medically stable   Consultants:  PCCM  Cardiology   Procedures: 6/10 Doppler legs >> partial acute DVT Rt common femoral vein  6/16 Echo >> mild LVH, EF 65 to 70%, grade 3 diastolic dysfx, mild AS, mild/mod MS, mod LA dilation  6/16 CT chest >> no PE, atherosclerosis, emphysema with interstitial fibrosis, small pericardial effusions, Rt base consolidation, foci of calcification in pericardium   Antibiotics:  daptomycin and meropenem (plan is to treat until 6/23)  HPI/Subjective: Afebrile; feeling slightly better. Patient reports been SOB and  feeling really weak.  Objective: Filed Vitals:   04/30/14 1440  BP: 128/85  Pulse: 123  Temp: 97.6 F (36.4 C)  Resp: 20    Intake/Output Summary (Last 24 hours) at 04/30/14 1630 Last data filed at 04/30/14 1441  Gross per 24 hour  Intake   1953 ml  Output   1127 ml  Net    826 ml   Filed Weights   04/28/14 0500 04/29/14 0500 04/29/14 1323  Weight: 90 kg (198 lb 6.6 oz) 88.1 kg (194 lb 3.6 oz) 86.4 kg (190 lb 7.6 oz)    Exam:   General:  NAD, afebrile, denies CP, abd pain and nausea/vomiting. Patient reports feeling SOB  Cardiovascular: irregular, no rubs or gallops appreciated  Respiratory: no wheezing; decrease BS at bases  Abdomen: soft, NT, ND, positive BS  Musculoskeletal: bilateral Edema 2+ (affecting upper extremities as well; but just 1++); no cyanosis  Data Reviewed: Basic Metabolic Panel:  Recent Labs Lab 04/26/14 2345 04/27/14 0500 04/28/14 0240 04/29/14 0412 04/30/14 0658  NA 142 144 138 144 144  K 3.9 3.6* 3.1* 4.5 4.3  CL 98 104 100 108 107  CO2 31 30 25 25 25   GLUCOSE 93 117* 135* 134* 86  BUN 18 16 13 12 13   CREATININE 0.77 0.63 0.50 0.63 0.78  CALCIUM 8.8 8.0* 8.3* 8.6 8.9  MG 2.3 2.2 2.3 2.6*  --   PHOS 3.8 3.3 2.0*  --   --    Liver Function Tests:  Recent Labs Lab 04/25/14 1805  AST 26  ALT 14  ALKPHOS 152*  BILITOT 0.8  PROT 6.7  ALBUMIN 2.7*   CBC:  Recent Labs Lab 04/25/14 1805 04/26/14 2345 04/27/14 0500 04/28/14 0240 04/29/14 0412 04/30/14 0658  WBC 6.9  7.2 7.5 5.0 4.3 9.7  NEUTROABS 5.8  --   --   --   --   --   HGB 9.2* 9.3* 8.2* 8.5* 8.7* 9.7*  HCT 29.3* 30.3* 27.0* 27.4* 28.8* 31.8*  MCV 85.4 86.8 87.1 85.4 86.5 86.2  PLT 200 155 165 153 171 176   Cardiac Enzymes:  Recent Labs Lab 04/25/14 1805 04/27/14 1313 04/27/14 1435 04/27/14 2126  CKTOTAL 39  --   --   --   TROPONINI  --  <0.30 <0.30 <0.30   BNP (last 3 results)  Recent Labs  02/06/14 0605 02/15/14 0640 04/30/14 0953  PROBNP  2426.0* 1292.0* 1579.0*   CBG:  Recent Labs Lab 04/29/14 1322 04/29/14 1634 04/29/14 2139 04/30/14 0602 04/30/14 1126  GLUCAP 139* 167* 164* 99 79    Recent Results (from the past 240 hour(s))  MRSA PCR SCREENING     Status: None   Collection Time    04/22/14  9:44 AM      Result Value Ref Range Status   MRSA by PCR NEGATIVE  NEGATIVE Final   Comment:            The GeneXpert MRSA Assay (FDA     approved for NASAL specimens     only), is one component of a     comprehensive MRSA colonization     surveillance program. It is not     intended to diagnose MRSA     infection nor to guide or     monitor treatment for     MRSA infections.  CULTURE, BLOOD (ROUTINE X 2)     Status: None   Collection Time    04/25/14  6:05 PM      Result Value Ref Range Status   Specimen Description BLOOD LEFT ARM   Final   Special Requests BOTTLES DRAWN AEROBIC ONLY 10CC   Final   Culture  Setup Time     Final   Value: 04/26/2014 00:46     Performed at [facility]   Culture     Final   Value:        BLOOD CULTURE RECEIVED NO GROWTH TO DATE CULTURE WILL BE HELD FOR 5 DAYS BEFORE ISSUING A FINAL NEGATIVE REPORT     Performed at [facility]   Report Status PENDING   Incomplete  URINE CULTURE     Status: None   Collection Time    04/25/14  6:12 PM      Result Value Ref Range Status   Specimen Description URINE, CLEAN CATCH   Final   Special Requests NONE   Final   Culture  Setup Time     Final   Value: 04/25/2014 18:51     Performed at [facility] Count     Final   Value: NO GROWTH     Performed at [facility]   Culture     Final   Value: NO GROWTH     Performed at [facility]   Report Status 04/26/2014 FINAL   Final  CULTURE, BLOOD (ROUTINE X 2)     Status: None   Collection Time    04/25/14  6:12 PM      Result Value Ref Range Status   Specimen Description BLOOD LEFT HAND   Final   Special Requests BOTTLES DRAWN AEROBIC ONLY  10CC   Final   Culture  Setup Time     Final  Value: 04/26/2014 00:46     [facility]   Culture     Final   Value:        BLOOD CULTURE RECEIVED NO GROWTH TO DATE CULTURE WILL BE HELD FOR 5 DAYS BEFORE ISSUING A FINAL NEGATIVE REPORT     [facility]   Report Status PENDING   Incomplete  URINE CULTURE     Status: None   Collection Time    04/26/14 10:30 PM      Result Value Ref Range Status   Specimen Description URINE, CLEAN CATCH   Final   Special Requests NONE   Final   Culture  Setup Time     Final   Value: 04/27/2014 05:27     [facility]   Col[place] Count     Final   Value: NO GROWTH     [facility]   Culture     Final   Value: NO GROWTH     [facility]   Report Status 04/28/2014 FINAL   Final  CULTURE, BLOOD (ROUTINE X 2)     Status: None   Collection Time    04/26/14 11:45 PM      Result Value Ref Range Status   Specimen Description BLOOD LEFT ARM   Final   Special Requests BOTTLES DRAWN AEROBIC ONLY 2CC   Final   Culture  Setup Time     Final   Value: 04/27/2014 04:31     [facility]   Culture     Final   Value:        BLOOD CULTURE RECEIVED NO GROWTH TO DATE CULTURE WILL BE HELD FOR 5 DAYS BEFORE ISSUING A FINAL NEGATIVE REPORT     [facility]   Report Status PENDING   Incomplete  CULTURE, BLOOD (ROUTINE X 2)     Status: None   Collection Time    04/27/14 12:00 AM      Result Value Ref Range Status   Specimen Description BLOOD LEFT HAND   Final   Special Requests BOTTLES DRAWN AEROBIC ONLY 1CC   Final   Culture  Setup Time     Final   Value: 04/27/2014 04:29     [facility]   Culture     Final   Value:        BLOOD CULTURE RECEIVED NO GROWTH TO DATE CULTURE WILL BE HELD FOR 5 DAYS BEFORE ISSUING A FINAL NEGATIVE REPORT     [facility]   Report Status PENDING    Incomplete     Studies: Dg Chest Port 1 View  04/29/2014   CLINICAL DATA:  Followup pneumonia  EXAM: PORTABLE CHEST - 1 VIEW  COMPARISON:  04/27/2014  FINDINGS: Mild left lower lobe atelectasis/ infiltrate unchanged. Right lung remains clear. Negative for heart failure or effusion. Right arm PICC tip in the SVC.  IMPRESSION: No significant change left lower lobe atelectasis/infiltrate   Electronically Signed   By: Marlan Palauharles  Clark M.D.   On: 04/29/2014 08:04    Scheduled Meds: . antiseptic oral rinse  15 mL Mouth Rinse q12n4p  . aspirin EC  81 mg Oral Daily  . collagenase   Topical Daily  . DAPTOmycin (CUBICIN)  IV  500 mg Intravenous Q24H  . folic acid  1 mg Oral Daily  . furosemide  20 mg  Intravenous BID  . insulin aspart  0-15 Units Subcutaneous TID WC  . lactobacillus acidophilus  1 tablet Oral BID  . lidocaine  1 patch Transdermal Daily  . meropenem (MERREM) IV  1 g Intravenous 3 times per day  . mirtazapine  15 mg Oral QHS  . multivitamin with minerals  1 tablet Oral Daily  . pantoprazole  40 mg Oral Q1200  . predniSONE  10 mg Oral Q breakfast  . pregabalin  100 mg Oral TID  . senna  1 tablet Oral QHS  . sodium chloride  10-40 mL Intracatheter Q12H  . tamsulosin  0.4 mg Oral Daily  . vitamin C  250 mg Oral TID WC  . zinc sulfate  220 mg Oral Daily   Continuous Infusions: . sodium chloride 75 mL/hr at 04/29/14 0800  . heparin 1,350 Units/hr (04/30/14 1252)    Active Problems:   Sepsis   Hypotension   Encephalopathy acute   Mitral stenosis   Aortic stenosis   Atrial fibrillation   Pericardial calcification    Time spent: >30 minutes    Vassie Loll  [facility] Pager [phone]. If 7PM-7AM, please contact night-coverage at [email or web address], password [facility] 04/30/2014, 4:30 PM  LOS: 4 days

## 2014-04-30 NOTE — Progress Notes (Signed)
Patient edema worsening and heart rate sustaining 120-140's A-Fib. Patient was found to be febrile at 101.2. T.Callahan,NP notified and received orders to [facility] IVF and change Duoneb to Xopenex. Cardiology Fellow notified and was told to monitor heart rate after treating fever with Tylenol. Will continue to monitor. Blood pressure 113/66, pulse 105, temperature 101.2 F (38.4 C), temperature source Oral, resp. rate 18, height 5\' 8"  (1.727 m), weight 86.4 kg (190 lb 7.6 oz), SpO2 95.00%. 

## 2014-04-30 NOTE — Care Management Note (Signed)
    Page 1 of 1   05/12/2014     10:42:35 AM CARE MANAGEMENT NOTE 05/12/2014  Patient:  Jon Lozano, Jon Lozano   Account Number:  [id number]  Date Initiated:  04/27/2014  Documentation initiated by:  Slaton Reaser  Subjective/Objective Assessment:   dx ?sepsis; admitted [facility]; lives with spouse     Action/Plan:   Anticipated DC Date:     Anticipated DC Plan:    In-house referral  Clinical Social Worker      DC Associate Professor  CM consult      Choice offered to / List presented to:             Status of service:   Medicare Important Message given?  YES (If response is "NO", the following Medicare IM given date fields will be blank) Date Medicare IM given:  04/30/2014 Medicare IM given by:   Date Additional Medicare IM given:  05/12/2014 Additional Medicare IM given by:  [facility] Emori Mumme  Discharge Disposition:    Per UR Regulation:  Reviewed for med. necessity/level of care/duration of stay  If discussed at Long Length of Stay Meetings, dates discussed:    Comments:  05/12/14 0710 Alys Dulak RN MSN BSN CCM S/P PC mtg, pt to transition to comfort care, transfer to residential hospice.

## 2014-05-01 DIAGNOSIS — I10 Essential (primary) hypertension: Secondary | ICD-10-CM

## 2014-05-01 LAB — GLUCOSE, CAPILLARY
GLUCOSE-CAPILLARY: 90 mg/dL (ref 70–99)
GLUCOSE-CAPILLARY: 132 mg/dL — AB (ref 70–99)
Glucose-Capillary: 104 mg/dL — ABNORMAL HIGH (ref 70–99)
Glucose-Capillary: 107 mg/dL — ABNORMAL HIGH (ref 70–99)

## 2014-05-01 LAB — HEPARIN LEVEL (UNFRACTIONATED): Heparin Unfractionated: 0.3 IU/mL (ref 0.30–0.70)

## 2014-05-01 LAB — CBC
HEMATOCRIT: 28.7 % — AB (ref 39.0–52.0)
Hemoglobin: 8.7 g/dL — ABNORMAL LOW (ref 13.0–17.0)
MCH: 25.8 pg — AB (ref 26.0–34.0)
MCHC: 30.3 g/dL (ref 30.0–36.0)
MCV: 85.2 fL (ref 78.0–100.0)
PLATELETS: 161 10*3/uL (ref 150–400)
RBC: 3.37 MIL/uL — AB (ref 4.22–5.81)
RDW: 16.4 % — ABNORMAL HIGH (ref 11.5–15.5)
WBC: 7.7 10*3/uL (ref 4.0–10.5)

## 2014-05-01 MED ORDER — BUDESONIDE 0.25 MG/2ML IN SUSP
0.2500 mg | Freq: Two times a day (BID) | RESPIRATORY_TRACT | Status: DC
  Administered 2014-05-01 – 2014-05-12 (×18): 0.25 mg via RESPIRATORY_TRACT
  Filled 2014-05-01 (×25): qty 2

## 2014-05-01 MED ORDER — FUROSEMIDE 10 MG/ML IJ SOLN
INTRAMUSCULAR | Status: AC
  Administered 2014-05-01: 20 mg
  Filled 2014-05-01: qty 4

## 2014-05-01 NOTE — Progress Notes (Signed)
[facility] PROGRESS NOTE  Jon Lozano AJH:[id number] DOB: Apr 09, 1941 DOA: 04/26/2014 PCP: Pcp Not In System  Assessment/Plan: 1-acute resp failure with hypoxia: due to HCPA. Mild diastolic heart failure contributing along with COPD -much better  -patient on 2L oxygen currently and with good O2 sat -continue antibiotics as dictated/recommended by ID -will continue flutter valve/IS -continue pulmicort and PRN xopenex  2-COPD/Emphysema: currently no wheezing -per pulmonary service plan is to continue bronchial hygiene, pulmicort and PRN bronchodilators (xopenex given A. 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: -per cardiology -continue heparin drip for now -BP was initially too soft for any antiarrythmic agent; will follow rec's -plan is once more stable, pursuit right heart cath to better assess/ evaluate pericardial constriction -slight elevation of HR overnight due to fever; stable now, will monitor  5-RLE DVT: continue heparin drip -eventually will benefit of xarelto therapy  6-Urinary retention: most likely associated with BPH -started on flomax  -will continue foley for now  7-GERD: continue PPI  8-RA: continue prednisone  9-acute toxic encephalopathy: due to infection -now resolved -patient AAOX3.  10-dysphagia/deconditioning: as per SPT rec's will continue nectar thick liquids and heart healthy diet. -patient to be discharge to SNF vs LTAC for rehabilitation once medically stable  Code Status: full  Family Communication: no family at bedside Disposition Plan: SNF vs LTAC when medically stable   Consultants:  PCCM  Cardiology   Procedures: 6/10 Doppler legs >> partial acute DVT Rt common femoral vein  6/16 Echo >> mild LVH, EF 65 to 70%, grade 3 diastolic dysfx,  mild AS, mild/mod MS, mod LA dilation  6/16 CT chest >> no PE, atherosclerosis, emphysema with interstitial fibrosis, small pericardial effusions, Rt base consolidation, foci of calcification in pericardium   Antibiotics:  daptomycin and meropenem (plan is to treat until 6/23)  HPI/Subjective: Overnight with episode of fever; reports breathing is better. Denies CP.  Objective: Filed Vitals:   05/01/14 0706  BP: 109/77  Pulse: 102  Temp: 97.8 F (36.6 C)  Resp: 18    Intake/Output Summary (Last 24 hours) at 05/01/14 1357 Last data filed at 05/01/14 0707  Gross per 24 hour  Intake  968.2 ml  Output   3350 ml  Net -2381.8 ml   Filed Weights   04/29/14 0500 04/29/14 1323 05/01/14 0706  Weight: 88.1 kg (194 lb 3.6 oz) 86.4 kg (190 lb 7.6 oz) 85.004 kg (187 lb 6.4 oz)    Exam:   General:  NAD, denies CP, abd pain and nausea/vomiting. Patient reports feeling less SOB  Cardiovascular: irregular, no rubs or gallops appreciated  Respiratory: no wheezing; decrease BS at bases  Abdomen: soft, NT, ND, positive BS  Musculoskeletal: bilateral Edema trace to 1+ (affecting upper extremities as well); no cyanosis  Data Reviewed: Basic Metabolic Panel:  Recent Labs Lab 04/26/14 2345 04/27/14 0500 04/28/14 0240 04/29/14 0412 04/30/14 0658  NA 142 144 138 144 144  K 3.9 3.6* 3.1* 4.5 4.3  CL 98 104 100 108 107  CO2 31 30 25 25 25   GLUCOSE 93 117* 135* 134* 86  BUN 18 16 13 12 13   CREATININE 0.77 0.63 0.50 0.63 0.78  CALCIUM 8.8 8.0* 8.3* 8.6 8.9  MG 2.3 2.2 2.3 2.6*  --   PHOS 3.8 3.3 2.0*  --   --    Liver Function Tests:  Recent  Labs Lab 04/25/14 1805  AST 26  ALT 14  ALKPHOS 152*  BILITOT 0.8  PROT 6.7  ALBUMIN 2.7*   CBC:  Recent Labs Lab 04/25/14 1805  04/27/14 0500 04/28/14 0240 04/29/14 0412 04/30/14 0658 05/01/14 0400  WBC 6.9  < > 7.5 5.0 4.3 9.7 7.7  NEUTROABS 5.8  --   --   --   --   --   --   HGB 9.2*  < > 8.2* 8.5* 8.7* 9.7* 8.7*   HCT 29.3*  < > 27.0* 27.4* 28.8* 31.8* 28.7*  MCV 85.4  < > 87.1 85.4 86.5 86.2 85.2  PLT 200  < > 165 153 171 176 161  < > = values in this interval not displayed. Cardiac Enzymes:  Recent Labs Lab 04/25/14 1805 04/27/14 1313 04/27/14 1435 04/27/14 2126  CKTOTAL 39  --   --   --   TROPONINI  --  <0.30 <0.30 <0.30   BNP (last 3 results)  Recent Labs  02/06/14 0605 02/15/14 0640 04/30/14 0953  PROBNP 2426.0* 1292.0* 1579.0*   CBG:  Recent Labs Lab 04/30/14 1126 04/30/14 1654 04/30/14 2142 05/01/14 0601 05/01/14 1111  GLUCAP 79 105* 115* 90 104*    Recent Results (from the past 240 hour(s))  MRSA PCR SCREENING     Status: None   Collection Time    04/22/14  9:44 AM      Result Value Ref Range Status   MRSA by PCR NEGATIVE  NEGATIVE Final   Comment:            The GeneXpert MRSA Assay (FDA     approved for NASAL specimens     only), is one component of a     comprehensive MRSA colonization     surveillance program. It is not     intended to diagnose MRSA     infection nor to guide or     monitor treatment for     MRSA infections.  CULTURE, BLOOD (ROUTINE X 2)     Status: None   Collection Time    04/25/14  6:05 PM      Result Value Ref Range Status   Specimen Description BLOOD LEFT ARM   Final   Special Requests BOTTLES DRAWN AEROBIC ONLY 10CC   Final   Culture  Setup Time     Final   Value: 04/26/2014 00:46     Performed at [facility]   Culture     Final   Value:        BLOOD CULTURE RECEIVED NO GROWTH TO DATE CULTURE WILL BE HELD FOR 5 DAYS BEFORE ISSUING A FINAL NEGATIVE REPORT     Performed at [facility]   Report Status PENDING   Incomplete  URINE CULTURE     Status: None   Collection Time    04/25/14  6:12 PM      Result Value Ref Range Status   Specimen Description URINE, CLEAN CATCH   Final   Special Requests NONE   Final   Culture  Setup Time     Final   Value: 04/25/2014 18:51     Performed at [facility] Count     Final   Value: NO GROWTH     Performed at [facility]   Culture     Final   Value: NO GROWTH     Performed at [facility]   Report Status 04/26/2014  FINAL   Final  CULTURE, BLOOD (ROUTINE X 2)     Status: None   Collection Time    04/25/14  6:12 PM      Result Value Ref Range Status   Specimen Description BLOOD LEFT HAND   Final   Special Requests BOTTLES DRAWN AEROBIC ONLY 10CC   Final   Culture  Setup Time     Final   Value: 04/26/2014 00:46     Performed at [facility]   Culture     Final   Value:        BLOOD CULTURE RECEIVED NO GROWTH TO DATE CULTURE WILL BE HELD FOR 5 DAYS BEFORE ISSUING A FINAL NEGATIVE REPORT     Performed at [facility]   Report Status PENDING   Incomplete  URINE CULTURE     Status: None   Collection Time    04/26/14 10:30 PM      Result Value Ref Range Status   Specimen Description URINE, CLEAN CATCH   Final   Special Requests NONE   Final   Culture  Setup Time     Final   Value: 04/27/2014 05:27     Performed at [facility] Count     Final   Value: NO GROWTH     Performed at [facility]   Culture     Final   Value: NO GROWTH     Performed at [facility]   Report Status 04/28/2014 FINAL   Final  CULTURE, BLOOD (ROUTINE X 2)     Status: None   Collection Time    04/26/14 11:45 PM      Result Value Ref Range Status   Specimen Description BLOOD LEFT ARM   Final   Special Requests BOTTLES DRAWN AEROBIC ONLY 2CC   Final   Culture  Setup Time     Final   Value: 04/27/2014 04:31     Performed at [facility]   Culture     Final   Value:        BLOOD CULTURE RECEIVED NO GROWTH TO DATE CULTURE WILL BE HELD FOR 5 DAYS BEFORE ISSUING A FINAL NEGATIVE REPORT     Performed at [facility]   Report Status PENDING   Incomplete  CULTURE, BLOOD (ROUTINE X 2)     Status: None   Collection Time    04/27/14 12:00 AM      Result Value Ref Range Status    Specimen Description BLOOD LEFT HAND   Final   Special Requests BOTTLES DRAWN AEROBIC ONLY 1CC   Final   Culture  Setup Time     Final   Value: 04/27/2014 04:29     Performed at [facility]   Culture     Final   Value:        BLOOD CULTURE RECEIVED NO GROWTH TO DATE CULTURE WILL BE HELD FOR 5 DAYS BEFORE ISSUING A FINAL NEGATIVE REPORT     Performed at [facility]   Report Status PENDING   Incomplete     Studies: No results found.  Scheduled Meds: . antiseptic oral rinse  15 mL Mouth Rinse q12n4p  . aspirin EC  81 mg Oral Daily  . budesonide (PULMICORT) nebulizer solution  0.25 mg Nebulization BID  . collagenase   Topical Daily  . DAPTOmycin (CUBICIN)  IV  500 mg Intravenous Q24H  . folic acid  1 mg  Oral Daily  . furosemide  20 mg Intravenous BID  . insulin aspart  0-15 Units Subcutaneous TID WC  . lactobacillus acidophilus  1 tablet Oral BID  . lidocaine  1 patch Transdermal Daily  . meropenem (MERREM) IV  1 g Intravenous 3 times per day  . mirtazapine  15 mg Oral QHS  . multivitamin with minerals  1 tablet Oral Daily  . pantoprazole  40 mg Oral Q1200  . predniSONE  10 mg Oral Q breakfast  . pregabalin  100 mg Oral TID  . senna  1 tablet Oral QHS  . sodium chloride  10-40 mL Intracatheter Q12H  . tamsulosin  0.4 mg Oral Daily  . vitamin C  250 mg Oral TID WC  . zinc sulfate  220 mg Oral Daily   Continuous Infusions: . sodium chloride 10 mL/hr at 04/30/14 2036  . heparin 1,350 Units/hr (05/01/14 0825)    Active Problems:   Sepsis   Hypotension   Encephalopathy acute   Mitral stenosis   Aortic stenosis   Atrial fibrillation   Pericardial calcification    Time spent: >30 minutes    Vassie Loll  [facility] Pager [phone]. If 7PM-7AM, please contact night-coverage at [email or web address], password [facility] 05/01/2014, 1:57 PM  LOS: 5 days

## 2014-05-01 NOTE — Progress Notes (Signed)
SUBJECTIVE:  The patient was febrile last night.  HR was up.  However, this AM he seems to be better.  No acute complaints.  Breathing better   PHYSICAL EXAM Filed Vitals:   04/30/14 2039 04/30/14 2048 04/30/14 2250 05/01/14 0706  BP: 113/66   109/77  Pulse: 105   102  Temp: 101.2 F (38.4 C)  100.1 F (37.8 C) 97.8 F (36.6 C)  TempSrc: Oral   Oral  Resp: 18   18  Height:      Weight:    187 lb 6.4 oz (85.004 kg)  SpO2: 95% 95%  96%   General:  No distress Lungs:  Decreased breath sounds but no wheezing Heart:  Irregular Abdomen:  Positive bowel sounds, no rebound no guarding Extremities:  Mild edema  LABS: Lab Results  Component Value Date   TROPONINI <0.30 04/27/2014   Results for orders placed during the hospital encounter of 04/26/14 (from the past 24 hour(s))  GLUCOSE, CAPILLARY     Status: None   Collection Time    04/30/14 11:26 AM      Result Value Ref Range   Glucose-Capillary 79  70 - 99 mg/dL  GLUCOSE, CAPILLARY     Status: Abnormal   Collection Time    04/30/14  4:54 PM      Result Value Ref Range   Glucose-Capillary 105 (*) 70 - 99 mg/dL  GLUCOSE, CAPILLARY     Status: Abnormal   Collection Time    04/30/14  9:42 PM      Result Value Ref Range   Glucose-Capillary 115 (*) 70 - 99 mg/dL   Comment 1 Notify RN     Comment 2 Documented in Chart    HEPARIN LEVEL (UNFRACTIONATED)     Status: None   Collection Time    05/01/14  4:00 AM      Result Value Ref Range   Heparin Unfractionated 0.30  0.30 - 0.70 IU/mL  CBC     Status: Abnormal   Collection Time    05/01/14  4:00 AM      Result Value Ref Range   WBC 7.7  4.0 - 10.5 K/uL   RBC 3.37 (*) 4.22 - 5.81 MIL/uL   Hemoglobin 8.7 (*) 13.0 - 17.0 g/dL   HCT 71.0 (*) 62.6 - 94.8 %   MCV 85.2  78.0 - 100.0 fL   MCH 25.8 (*) 26.0 - 34.0 pg   MCHC 30.3  30.0 - 36.0 g/dL   RDW 54.6 (*) 27.0 - 35.0 %   Platelets 161  150 - 400 K/uL  GLUCOSE, CAPILLARY     Status: None   Collection Time    05/01/14   6:01 AM      Result Value Ref Range   Glucose-Capillary 90  70 - 99 mg/dL    Intake/Output Summary (Last 24 hours) at 05/01/14 1043 Last data filed at 05/01/14 0707  Gross per 24 hour  Intake 1188.2 ml  Output   3351 ml  Net -2162.8 ml     ASSESSMENT AND PLAN:  ATRIAL FIB:  Currently on heparin.  Continue current med.  Heart rate increased yesterday secondary to fever.  Better this AM.    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  Also plan repeat echo once heart rate is improved.   Avoiding beta blocker with lung issues.   ACUTE ON CHRONIC DIASTOLIC HF:  Diuretics started yesterday.  I will order a BMET.    Continue diuretic today.   ANEMIA:  Hbg down today.  Per primary team.    Rollene Rotunda 05/01/2014 10:43 AM

## 2014-05-01 NOTE — Progress Notes (Signed)
ANTICOAGULATION CONSULT NOTE - Follow Up Consult  Pharmacy Consult for Heparin Indication: DVT, afib  No Known Allergies  Patient Measurements: Height: 5\' 8"  (172.7 cm) Weight: 187 lb 6.4 oz (85.004 kg) (bed scale) IBW/kg (Calculated) : 68.4 Heparin Dosing Weight: 85kg  Vital Signs: Temp: 97.8 F (36.6 C) (06/20 0706) Temp src: Oral (06/20 0706) BP: 109/77 mmHg (06/20 0706) Pulse Rate: 102 (06/20 0706)  Labs:  Recent Labs  04/29/14 0412 04/30/14 0658 05/01/14 0400  HGB 8.7* 9.7* 8.7*  HCT 28.8* 31.8* 28.7*  PLT 171 176 161  HEPARINUNFRC 0.52 0.37 0.30  CREATININE 0.63 0.78  --     Estimated Creatinine Clearance: 89.8 ml/min (by C-G formula based on Cr of 0.78).  Medications:  Heparin 1350 units/hr  Assessment: 72yom continues on heparin for RLE DVT and Afib. Heparin level remains therapeutic on current rate. CBC is stable and no bleeding noted. Oral anticoagulation has not been decided upon yet due to pending surgical procedures.   Goal of Therapy:  Heparin level 0.3-0.7 units/ml Monitor platelets by anticoagulation protocol: Yes   Plan:  1. Continue heparin drip 1350 units/hr (13.5 ml/hr) 2. F/u AM heparin level and CBC 3. F/u plan for oral anticoag  Tanja Gift M. 05/03/14, PharmD Clinical Pharmacist- Resident Pager: [phone] Pharmacy: [phone] 05/01/2014 7:38 AM

## 2014-05-02 ENCOUNTER — Inpatient Hospital Stay (HOSPITAL_COMMUNITY): Payer: Medicare Other

## 2014-05-02 LAB — GLUCOSE, CAPILLARY
GLUCOSE-CAPILLARY: 94 mg/dL (ref 70–99)
GLUCOSE-CAPILLARY: 174 mg/dL — AB (ref 70–99)
Glucose-Capillary: 128 mg/dL — ABNORMAL HIGH (ref 70–99)
Glucose-Capillary: 141 mg/dL — ABNORMAL HIGH (ref 70–99)

## 2014-05-02 LAB — CULTURE, BLOOD (ROUTINE X 2)
Culture: NO GROWTH
Culture: NO GROWTH

## 2014-05-02 LAB — CBC
HCT: 28 % — ABNORMAL LOW (ref 39.0–52.0)
Hemoglobin: 8.6 g/dL — ABNORMAL LOW (ref 13.0–17.0)
MCH: 26.2 pg (ref 26.0–34.0)
MCHC: 30.7 g/dL (ref 30.0–36.0)
MCV: 85.4 fL (ref 78.0–100.0)
Platelets: 150 10*3/uL (ref 150–400)
RBC: 3.28 MIL/uL — ABNORMAL LOW (ref 4.22–5.81)
RDW: 16.5 % — AB (ref 11.5–15.5)
WBC: 8.4 10*3/uL (ref 4.0–10.5)

## 2014-05-02 LAB — BASIC METABOLIC PANEL
BUN: 8 mg/dL (ref 6–23)
CALCIUM: 8.5 mg/dL (ref 8.4–10.5)
CO2: 31 mEq/L (ref 19–32)
Chloride: 90 mEq/L — ABNORMAL LOW (ref 96–112)
Creatinine, Ser: 0.76 mg/dL (ref 0.50–1.35)
GFR, EST NON AFRICAN AMERICAN: 90 mL/min — AB (ref >90)
Glucose, Bld: 85 mg/dL (ref 70–99)
Potassium: 3.5 mEq/L — ABNORMAL LOW (ref 3.7–5.3)
SODIUM: 145 meq/L (ref 137–147)

## 2014-05-02 LAB — HEPARIN LEVEL (UNFRACTIONATED)
HEPARIN UNFRACTIONATED: 0.41 [IU]/mL (ref 0.30–0.70)
Heparin Unfractionated: 0.19 IU/mL — ABNORMAL LOW (ref 0.30–0.70)
Heparin Unfractionated: 0.28 IU/mL — ABNORMAL LOW (ref 0.30–0.70)

## 2014-05-02 MED ORDER — FUROSEMIDE 10 MG/ML IJ SOLN: 20.0000 mg | Freq: Two times a day (BID) | INTRAMUSCULAR | Status: DC

## 2014-05-02 MED ORDER — OXYCODONE HCL 5 MG PO TABS
5.0000 mg | ORAL_TABLET | Freq: Four times a day (QID) | ORAL | Status: DC | PRN
  Administered 2014-05-02 – 2014-05-10 (×15): 5 mg via ORAL
  Filled 2014-05-02 (×15): qty 1

## 2014-05-02 MED ORDER — FUROSEMIDE 10 MG/ML IJ SOLN
20.0000 mg | Freq: Every day | INTRAMUSCULAR | Status: DC
  Administered 2014-05-02 – 2014-05-05 (×4): 20 mg via INTRAVENOUS
  Filled 2014-05-02 (×5): qty 2

## 2014-05-02 MED ORDER — POTASSIUM CHLORIDE CRYS ER 20 MEQ PO TBCR
20.0000 meq | EXTENDED_RELEASE_TABLET | Freq: Once | ORAL | Status: AC
  Administered 2014-05-02: 20 meq via ORAL
  Filled 2014-05-02: qty 1

## 2014-05-02 MED ORDER — POTASSIUM CHLORIDE 10 MEQ/100ML IV SOLN
10.0000 meq | INTRAVENOUS | Status: AC
  Administered 2014-05-02 (×2): 10 meq via INTRAVENOUS
  Filled 2014-05-02 (×2): qty 100

## 2014-05-02 NOTE — Progress Notes (Signed)
ANTICOAGULATION CONSULT NOTE - Follow Up Consult  Pharmacy Consult for Heparin Indication: DVT, afib  No Known Allergies  Patient Measurements: Height: 5\' 8"  (172.7 cm) Weight: 186 lb 11.7 oz (84.7 kg) IBW/kg (Calculated) : 68.4 Heparin Dosing Weight: 85kg  Vital Signs: Temp: 98.4 F (36.9 C) (06/21 0957) Temp src: Oral (06/21 0957) BP: 99/63 mmHg (06/21 0957) Pulse Rate: 93 (06/21 0957)  Labs:  Recent Labs  04/30/14 0658 05/01/14 0400 05/02/14 0310 05/02/14 1108  HGB 9.7* 8.7* 8.6*  --   HCT 31.8* 28.7* 28.0*  --   PLT 176 161 150  --   HEPARINUNFRC 0.37 0.30 0.19* 0.28*  CREATININE 0.78  --  0.76  --     Estimated Creatinine Clearance: 89.7 ml/min (by C-G formula based on Cr of 0.76).  Medications:  Heparin 1350 units/hr  Assessment: 73 yo M who continues on heparin for RLE DVT and Afib.   Heparin level this morning is slightly subtherapeutic at 0.28, though trending up after a recent rate increase. CBC is stable and no bleeding noted. Oral anticoagulation has not been decided upon yet due to pending surgical procedures.   Goal of Therapy:  Heparin level 0.3-0.7 units/ml Monitor platelets by anticoagulation protocol: Yes   Plan:  1. Increase heparin drip to 1550 units/hr  2. Obtain 8 hour HL (2030) 3. Daily HL and CBC 4. F/u plan for oral anticoagulation  Katelyn C. Felt, PharmD Clinical Pharmacist-Resident Pager: [phone] Pharmacy: [phone] 05/02/2014 12:06 PM

## 2014-05-02 NOTE — Progress Notes (Signed)
[facility] PROGRESS NOTE  Jon Lozano BJS:[id number] DOB: 10-12-1941 DOA: 04/26/2014 PCP: Pcp Not In System  Assessment/Plan: 1-acute resp failure with hypoxia: due to HCPA. Mild diastolic heart failure contributing along with COPD -much better  -patient on 2L oxygen currently and with good O2 sat -continue antibiotics as dictated/recommended by ID -will continue flutter valve/IS -continue pulmicort and PRN xopenex -continue low dose lasix  2-COPD/Emphysema: currently no wheezing -per pulmonary service plan is to continue bronchial hygiene, pulmicort and PRN bronchodilators (xopenex given A. 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: -per cardiology; 2-D echo in am -continue heparin drip for now --plan is once more stable, pursuit right heart cath to better assess/ evaluate pericardial constriction -HR stable  5-RLE DVT: continue heparin drip -eventually will benefit of xarelto therapy  6-Urinary retention: most likely associated with BPH -started on flomax approx 4 days ago -will attempt to d/c foley today  7-GERD: continue PPI  8-RA: continue prednisone  9-acute toxic encephalopathy: due to infection -now resolved -patient AAOX3.  10-dysphagia/deconditioning: as per SPT rec's will continue nectar thick liquids and heart healthy diet. -patient to be discharge to SNF vs LTAC for rehabilitation once medically stable  Code Status: full  Family Communication: no family at bedside Disposition Plan: SNF vs LTAC when medically stable   Consultants:  PCCM  Cardiology   Procedures: 6/10 Doppler legs >> partial acute DVT Rt common femoral vein  6/16 Echo >> mild LVH, EF 65 to 70%, grade 3 diastolic dysfx, mild AS, mild/mod MS, mod LA dilation  6/16 CT chest >> no PE,  atherosclerosis, emphysema with interstitial fibrosis, small pericardial effusions, Rt base consolidation, foci of calcification in pericardium   Antibiotics:  daptomycin and meropenem (plan is to treat until 6/23)  HPI/Subjective: No fever; breathing much better.. Also HR is better controlled. Denies CP  Objective: Filed Vitals:   05/02/14 0957  BP: 99/63  Pulse: 93  Temp: 98.4 F (36.9 C)  Resp: 18    Intake/Output Summary (Last 24 hours) at 05/02/14 1032 Last data filed at 05/02/14 0940  Gross per 24 hour  Intake 1362.33 ml  Output   4400 ml  Net -3037.67 ml   Filed Weights   04/29/14 1323 05/01/14 0706 05/02/14 0342  Weight: 86.4 kg (190 lb 7.6 oz) 85.004 kg (187 lb 6.4 oz) 84.7 kg (186 lb 11.7 oz)    Exam:   General:  NAD, denies CP, abd pain and nausea/vomiting. Patient reports breathing continue to improve  Cardiovascular: irregular, no rubs or gallops appreciated  Respiratory: no wheezing; decrease BS at bases  Abdomen: soft, NT, ND, positive BS  Musculoskeletal: bilateral Edema trace to 1+ (affecting upper extremities as well); no cyanosis  Data Reviewed: Basic Metabolic Panel:  Recent Labs Lab 04/26/14 2345 04/27/14 0500 04/28/14 0240 04/29/14 0412 04/30/14 0658 05/02/14 0310  NA 142 144 138 144 144 145  K 3.9 3.6* 3.1* 4.5 4.3 3.5*  CL 98 104 100 108 107 90*  CO2 31 30 25 25 25 31   GLUCOSE 93 117* 135* 134* 86 85  BUN 18 16 13 12 13 8   CREATININE 0.77 0.63 0.50 0.63 0.78 0.76  CALCIUM 8.8 8.0* 8.3* 8.6 8.9 8.5  MG 2.3 2.2 2.3 2.6*  --   --   PHOS 3.8 3.3 2.0*  --   --   --  Liver Function Tests:  Recent Labs Lab 04/25/14 1805  AST 26  ALT 14  ALKPHOS 152*  BILITOT 0.8  PROT 6.7  ALBUMIN 2.7*   CBC:  Recent Labs Lab 04/25/14 1805  04/28/14 0240 04/29/14 0412 04/30/14 0658 05/01/14 0400 05/02/14 0310  WBC 6.9  < > 5.0 4.3 9.7 7.7 8.4  NEUTROABS 5.8  --   --   --   --   --   --   HGB 9.2*  < > 8.5* 8.7* 9.7* 8.7*  8.6*  HCT 29.3*  < > 27.4* 28.8* 31.8* 28.7* 28.0*  MCV 85.4  < > 85.4 86.5 86.2 85.2 85.4  PLT 200  < > 153 171 176 161 150  < > = values in this interval not displayed. Cardiac Enzymes:  Recent Labs Lab 04/25/14 1805 04/27/14 1313 04/27/14 1435 04/27/14 2126  CKTOTAL 39  --   --   --   TROPONINI  --  <0.30 <0.30 <0.30   BNP (last 3 results)  Recent Labs  02/06/14 0605 02/15/14 0640 04/30/14 0953  PROBNP 2426.0* 1292.0* 1579.0*   CBG:  Recent Labs Lab 05/01/14 0601 05/01/14 1111 05/01/14 1626 05/01/14 2136 05/02/14 0604  GLUCAP 90 104* 132* 107* 94    Recent Results (from the past 240 hour(s))  CULTURE, BLOOD (ROUTINE X 2)     Status: None   Collection Time    04/25/14  6:05 PM      Result Value Ref Range Status   Specimen Description BLOOD LEFT ARM   Final   Special Requests BOTTLES DRAWN AEROBIC ONLY 10CC   Final   Culture  Setup Time     Final   Value: 04/26/2014 00:46     Performed at [facility]   Culture     Final   Value: NO GROWTH 5 DAYS     Performed at [facility]   Report Status 05/02/2014 FINAL   Final  URINE CULTURE     Status: None   Collection Time    04/25/14  6:12 PM      Result Value Ref Range Status   Specimen Description URINE, CLEAN CATCH   Final   Special Requests NONE   Final   Culture  Setup Time     Final   Value: 04/25/2014 18:51     Performed at [facility]   Colony Count     Final   Value: NO GROWTH     Performed at [facility]   Culture     Final   Value: NO GROWTH     Performed at [facility]   Report Status 04/26/2014 FINAL   Final  CULTURE, BLOOD (ROUTINE X 2)     Status: None   Collection Time    04/25/14  6:12 PM      Result Value Ref Range Status   Specimen Description BLOOD LEFT HAND   Final   Special Requests BOTTLES DRAWN AEROBIC ONLY 10CC   Final   Culture  Setup Time     Final   Value: 04/26/2014 00:46     Performed at [facility]   Culture      Final   Value: NO GROWTH 5 DAYS     Performed at [facility]   Report Status 05/02/2014 FINAL   Final  URINE CULTURE     Status: None   Collection Time    04/26/14 10:30 PM  Result Value Ref Range Status   Specimen Description URINE, CLEAN CATCH   Final   Special Requests NONE   Final   Culture  Setup Time     Final   Value: 04/27/2014 05:27     Performed at [facility] Count     Final   Value: NO GROWTH     Performed at [facility]   Culture     Final   Value: NO GROWTH     Performed at [facility]   Report Status 04/28/2014 FINAL   Final  CULTURE, BLOOD (ROUTINE X 2)     Status: None   Collection Time    04/26/14 11:45 PM      Result Value Ref Range Status   Specimen Description BLOOD LEFT ARM   Final   Special Requests BOTTLES DRAWN AEROBIC ONLY 2CC   Final   Culture  Setup Time     Final   Value: 04/27/2014 04:31     Performed at [facility]   Culture     Final   Value:        BLOOD CULTURE RECEIVED NO GROWTH TO DATE CULTURE WILL BE HELD FOR 5 DAYS BEFORE ISSUING A FINAL NEGATIVE REPORT     Performed at [facility]   Report Status PENDING   Incomplete  CULTURE, BLOOD (ROUTINE X 2)     Status: None   Collection Time    04/27/14 12:00 AM      Result Value Ref Range Status   Specimen Description BLOOD LEFT HAND   Final   Special Requests BOTTLES DRAWN AEROBIC ONLY 1CC   Final   Culture  Setup Time     Final   Value: 04/27/2014 04:29     Performed at [facility]   Culture     Final   Value:        BLOOD CULTURE RECEIVED NO GROWTH TO DATE CULTURE WILL BE HELD FOR 5 DAYS BEFORE ISSUING A FINAL NEGATIVE REPORT     Performed at [facility]   Report Status PENDING   Incomplete     Studies: No results found.  Scheduled Meds: . antiseptic oral rinse  15 mL Mouth Rinse q12n4p  . aspirin EC  81 mg Oral Daily  . budesonide (PULMICORT) nebulizer solution  0.25 mg Nebulization BID  .  collagenase   Topical Daily  . DAPTOmycin (CUBICIN)  IV  500 mg Intravenous Q24H  . folic acid  1 mg Oral Daily  . furosemide  20 mg Intravenous Daily  . insulin aspart  0-15 Units Subcutaneous TID WC  . lactobacillus acidophilus  1 tablet Oral BID  . lidocaine  1 patch Transdermal Daily  . meropenem (MERREM) IV  1 g Intravenous 3 times per day  . mirtazapine  15 mg Oral QHS  . multivitamin with minerals  1 tablet Oral Daily  . pantoprazole  40 mg Oral Q1200  . predniSONE  10 mg Oral Q breakfast  . pregabalin  100 mg Oral TID  . senna  1 tablet Oral QHS  . sodium chloride  10-40 mL Intracatheter Q12H  . tamsulosin  0.4 mg Oral Daily  . vitamin C  250 mg Oral TID WC  . zinc sulfate  220 mg Oral Daily   Continuous Infusions: . sodium chloride 10 mL/hr at 04/30/14 2036  . heparin 1,450 Units/hr (05/02/14 0514)    Active Problems:  Sepsis   Hypotension   Encephalopathy acute   Mitral stenosis   Aortic stenosis   Atrial fibrillation   Pericardial calcification    Time spent: >30 minutes    Vassie Loll  [facility] Pager [phone]. If 7PM-7AM, please contact night-coverage at [email or web address], password [facility] 05/02/2014, 10:32 AM  LOS: 6 days

## 2014-05-02 NOTE — Progress Notes (Signed)
Patient alert and oriented x4, vital signs stable.  Patient medicated with PRNs for pain during shift.  Dressing change this afternoon on back and leg wounds per WOC orders, patient tolerated well.  Back wound weeping ample amounts of serous fluid, dressing reinforced with ABD pads.  Foley discontinued this afternoon at 1640 per MD order.  Patient due to void this evening, night shift RN aware.  Patient's wife concerned about urinary retention, but reassured by protocol to bladder scan with minimal or no output.  Patient and family updated on plan of care, deny any questions or concerns at this time.  Will continue to monitor.

## 2014-05-02 NOTE — Progress Notes (Signed)
ANTICOAGULATION CONSULT NOTE - Follow Up Consult  Pharmacy Consult for Heparin Indication: DVT, afib  No Known Allergies  Patient Measurements: Height: 5\' 8"  (172.7 cm) Weight: 186 lb 11.7 oz (84.7 kg) IBW/kg (Calculated) : 68.4 Heparin Dosing Weight: 85kg  Vital Signs: Temp: 98.2 F (36.8 C) (06/20 2019) Temp src: Oral (06/20 2019) BP: 102/62 mmHg (06/20 2019) Pulse Rate: 76 (06/20 2019)  Labs:  Recent Labs  04/30/14 0658 05/01/14 0400 05/02/14 0310  HGB 9.7* 8.7* 8.6*  HCT 31.8* 28.7* 28.0*  PLT 176 161 150  HEPARINUNFRC 0.37 0.30 0.19*  CREATININE 0.78  --  0.76    Estimated Creatinine Clearance: 89.7 ml/min (by C-G formula based on Cr of 0.76).  Medications:  Heparin 1350 units/hr  Assessment: Jon Lozano continues on heparin for RLE DVT and Afib. Heparin level now subtherapeutic. CBC is stable and no bleeding noted.  Goal of Therapy:  Heparin level 0.3-0.7 units/ml Monitor platelets by anticoagulation protocol: Yes   Plan:  1. Increase heparin drip to 1450 units/hr  2. F/u  heparin level in 6 hours  05/04/14, PharmD Clinical Pharmacist Pharmacy: [phone] 05/02/2014 5:13 AM

## 2014-05-02 NOTE — Progress Notes (Signed)
SUBJECTIVE:  He looks better again this AM.  He denies pain.  Reports that he is breathing better.     PHYSICAL EXAM Filed Vitals:   05/01/14 2019 05/01/14 2059 05/02/14 0342 05/02/14 0519  BP: 102/62   94/64  Pulse: 76   79  Temp: 98.2 F (36.8 C)   97.9 F (36.6 C)  TempSrc: Oral   Axillary  Resp: 18   18  Height:      Weight:   186 lb 11.7 oz (84.7 kg)   SpO2: 95% 94%  94%   General:  No distress Lungs:  Decreased breath sounds but no wheezing or crackles. Heart:  Irregular Abdomen:  Positive bowel sounds, no rebound no guarding Extremities:  Mild edema  LABS: Lab Results  Component Value Date   TROPONINI <0.30 04/27/2014   Results for orders placed during the hospital encounter of 04/26/14 (from the past 24 hour(s))  GLUCOSE, CAPILLARY     Status: Abnormal   Collection Time    05/01/14 11:11 AM      Result Value Ref Range   Glucose-Capillary 104 (*) 70 - 99 mg/dL  GLUCOSE, CAPILLARY     Status: Abnormal   Collection Time    05/01/14  4:26 PM      Result Value Ref Range   Glucose-Capillary 132 (*) 70 - 99 mg/dL  GLUCOSE, CAPILLARY     Status: Abnormal   Collection Time    05/01/14  9:36 PM      Result Value Ref Range   Glucose-Capillary 107 (*) 70 - 99 mg/dL  HEPARIN LEVEL (UNFRACTIONATED)     Status: Abnormal   Collection Time    05/02/14  3:10 AM      Result Value Ref Range   Heparin Unfractionated 0.19 (*) 0.30 - 0.70 IU/mL  CBC     Status: Abnormal   Collection Time    05/02/14  3:10 AM      Result Value Ref Range   WBC 8.4  4.0 - 10.5 K/uL   RBC 3.28 (*) 4.22 - 5.81 MIL/uL   Hemoglobin 8.6 (*) 13.0 - 17.0 g/dL   HCT 94.7 (*) 65.4 - 65.0 %   MCV 85.4  78.0 - 100.0 fL   MCH 26.2  26.0 - 34.0 pg   MCHC 30.7  30.0 - 36.0 g/dL   RDW 35.4 (*) 65.6 - 81.2 %   Platelets 150  150 - 400 K/uL  BASIC METABOLIC PANEL     Status: Abnormal   Collection Time    05/02/14  3:10 AM      Result Value Ref Range   Sodium 145  137 - 147 mEq/L   Potassium 3.5  (*) 3.7 - 5.3 mEq/L   Chloride 90 (*) 96 - 112 mEq/L   CO2 31  19 - 32 mEq/L   Glucose, Bld 85  70 - 99 mg/dL   BUN 8  6 - 23 mg/dL   Creatinine, Ser 7.51  0.50 - 1.35 mg/dL   Calcium 8.5  8.4 - 70.0 mg/dL   GFR calc non Af Amer 90 (*) >90 mL/min   GFR calc Af Amer >90  >90 mL/min  GLUCOSE, CAPILLARY     Status: None   Collection Time    05/02/14  6:04 AM      Result Value Ref Range   Glucose-Capillary 94  70 - 99 mg/dL   Comment 1 Notify RN     Comment 2 Documented in  Chart      Intake/Output Summary (Last 24 hours) at 05/02/14 0753 Last data filed at 05/02/14 0306  Gross per 24 hour  Intake 1209.56 ml  Output   4400 ml  Net -3190.44 ml     ASSESSMENT AND PLAN:  ATRIAL FIB:  Currently on heparin.  Continue current med.  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  Also plan repeat echo once heart rate is improved (perhaps tomorrow).   Avoiding beta blocker with lung issues.   ACUTE ON CHRONIC DIASTOLIC HF:   Diuretics started two days ago.  I will give a low dose of potassium.    Continue diuretic again today.   ANEMIA:  Hbg stable.  Per primary team.    Rollene Rotunda 05/02/2014 7:53 AM

## 2014-05-02 NOTE — Clinical Social Work Note (Signed)
CSW continues to follow this patient. CSW met with patient who reported he still has not had the opportunity to speak with wife about SNF placement. Patient requested CSW to speak with him tomorrow. CSW to follow tomorrow.  [place], [place] Weekend Clinical Social Worker [phone]

## 2014-05-02 NOTE — Progress Notes (Signed)
ANTICOAGULATION CONSULT NOTE - Follow Up Consult  Pharmacy Consult for Heparin Indication: DVT, afib  No Known Allergies  Patient Measurements: Height: 5\' 8"  (172.7 cm) Weight: 186 lb 11.7 oz (84.7 kg) IBW/kg (Calculated) : 68.4 Heparin Dosing Weight: 85kg  Vital Signs: Temp: 98.5 F (36.9 C) (06/21 1337) Temp src: Oral (06/21 2005) BP: 103/63 mmHg (06/21 2005) Pulse Rate: 113 (06/21 2005)  Labs:  Recent Labs  04/30/14 0658 05/01/14 0400 05/02/14 0310 05/02/14 1108 05/02/14 2115  HGB 9.7* 8.7* 8.6*  --   --   HCT 31.8* 28.7* 28.0*  --   --   PLT 176 161 150  --   --   HEPARINUNFRC 0.37 0.30 0.19* 0.28* 0.41  CREATININE 0.78  --  0.76  --   --     Estimated Creatinine Clearance: 89.7 ml/min (by C-G formula based on Cr of 0.76).  Medications:  Heparin 1550 units/hr  Assessment: 72 yo M who continues on heparin for RLE DVT and Afib.   Heparin level at goal.  CBC is stable and no bleeding noted.   Goal of Therapy:  Heparin level 0.3-0.7 units/ml Monitor platelets by anticoagulation protocol: Yes   Plan:  1. Continue  heparin drip at 1550 units/hr  3. Daily HL and CBC 4. F/u plan for oral anticoagulation  Thank you for allowing pharmacy to be a part of this patients care team.  62 Pharm.D., BCPS, AQ-Cardiology Clinical Pharmacist 05/02/2014 9:59 PM Pager: [phone] Phone: [phone]

## 2014-05-02 NOTE — Clinical Social Work Note (Signed)
CSW met with patient who was alert and oriented x4. CSW introduced self and explained role. CSW discussed d/c plan with patient. Per patient, he is considering SNF placement, but will need to talk with his wife about it over the weekend. CSW verbalized understanding and will follow-up tomorrow.  [place], [place] Weekend Clinical Social Worker [phone]

## 2014-05-03 LAB — CBC
HCT: 28 % — ABNORMAL LOW (ref 39.0–52.0)
Hemoglobin: 8.7 g/dL — ABNORMAL LOW (ref 13.0–17.0)
MCH: 26.3 pg (ref 26.0–34.0)
MCHC: 31.1 g/dL (ref 30.0–36.0)
MCV: 84.6 fL (ref 78.0–100.0)
PLATELETS: 148 10*3/uL — AB (ref 150–400)
RBC: 3.31 MIL/uL — AB (ref 4.22–5.81)
RDW: 16.6 % — ABNORMAL HIGH (ref 11.5–15.5)
WBC: 8.2 10*3/uL (ref 4.0–10.5)

## 2014-05-03 LAB — CULTURE, BLOOD (ROUTINE X 2)
CULTURE: NO GROWTH
Culture: NO GROWTH

## 2014-05-03 LAB — BASIC METABOLIC PANEL
BUN: 9 mg/dL (ref 6–23)
CHLORIDE: 95 meq/L — AB (ref 96–112)
CO2: 29 mEq/L (ref 19–32)
Calcium: 8.9 mg/dL (ref 8.4–10.5)
Creatinine, Ser: 0.7 mg/dL (ref 0.50–1.35)
GFR calc Af Amer: >90 mL/min (ref >90)
Glucose, Bld: 111 mg/dL — ABNORMAL HIGH (ref 70–99)
POTASSIUM: 3.6 meq/L — AB (ref 3.7–5.3)
SODIUM: 135 meq/L — AB (ref 137–147)

## 2014-05-03 LAB — GLUCOSE, CAPILLARY
GLUCOSE-CAPILLARY: 97 mg/dL (ref 70–99)
GLUCOSE-CAPILLARY: 130 mg/dL — AB (ref 70–99)
GLUCOSE-CAPILLARY: 113 mg/dL — AB (ref 70–99)
Glucose-Capillary: 107 mg/dL — ABNORMAL HIGH (ref 70–99)

## 2014-05-03 LAB — HEPARIN LEVEL (UNFRACTIONATED): Heparin Unfractionated: 0.46 IU/mL (ref 0.30–0.70)

## 2014-05-03 LAB — CK: Total CK: 54 U/L (ref 7–232)

## 2014-05-03 MED ORDER — BISOPROLOL FUMARATE 5 MG PO TABS
2.5000 mg | ORAL_TABLET | Freq: Every day | ORAL | Status: DC
  Administered 2014-05-03 – 2014-05-12 (×9): 2.5 mg via ORAL
  Filled 2014-05-03 (×11): qty 0.5

## 2014-05-03 NOTE — Progress Notes (Signed)
Report given to receiving RN. Patient in bed sleeping. No signs or symptoms of distress or discomfort noted.  

## 2014-05-03 NOTE — Progress Notes (Signed)
PT Cancellation Note  Patient Details Name: Jon Lozano MRN: [id number] DOB: 1940/11/30   Cancelled Treatment:    Reason Eval/Treat Not Completed: Medical issues which prohibited therapy. Per RN pt in increased pain this afternoon. He has been running a fever and lab is present in room to draw blood. Will continue to follow.    Ruthann Cancer 05/03/2014, 3:52 PM  Ruthann Cancer, PT, DPT Acute Rehabilitation Services Pager: [phone]

## 2014-05-03 NOTE — Progress Notes (Signed)
[facility] PROGRESS NOTE  NIKAI QUEST OAC:[id number] DOB: 1941/05/02 DOA: 04/26/2014 PCP: Pcp Not In System  Assessment/Plan: 1-acute resp failure with hypoxia: due to HCPA. Mild diastolic heart failure contributing along with COPD -much better  -patient on 2L oxygen currently and with good O2 sat -continue antibiotics as dictated/recommended by ID -will continue flutter valve/IS -continue pulmicort and PRN xopenex -continue low dose lasix  2-COPD/Emphysema: currently no wheezing -per pulmonary service plan is to continue bronchial hygiene, pulmicort and PRN bronchodilators (xopenex given A. Fib)  3-septic shock: now resolved -BP stable  -patient off pressors since 6/18 -will monitor vital signs closely  4-atrial fibrillation and pericardial calcification/constrictive pericarditis: -per cardiology; 2-D echo in am -continue heparin drip for now --plan is once more stable, pursuit right heart cath to better assess/ evaluate pericardial constriction -HR has been all over the place; cardiology recommended low dose bisoprolol  5-RLE DVT: continue heparin drip -eventually will benefit of xarelto therapy; will follow cardiology rec's on that  6-Urinary retention: most likely associated with BPH -started on flomax approx 5 days ago -voiding trials were successful and foley has been removed   7-GERD: continue PPI  8-RA: continue prednisone  9-acute toxic encephalopathy: due to infection -now resolved -patient AAOX3.  10-dysphagia/deconditioning: as per SPT rec's will continue nectar thick liquids and heart healthy diet. -patient to be discharge to SNF vs LTAC for rehabilitation once medically stable  11-ongoing fever: discussed with ID (Dr. Ilsa Iha) -recommendation to continue current abx's therapy and to stop on 6/23 as initially recommended -blood cx's has been repeated on 6/22  Code Status: full  Family Communication: no family at bedside Disposition Plan: SNF  vs LTAC when medically stable   Consultants:  PCCM  Cardiology   Procedures: 6/10 Doppler legs >> partial acute DVT Rt common femoral vein  6/16 Echo >> mild LVH, EF 65 to 70%, grade 3 diastolic dysfx, mild AS, mild/mod MS, mod LA dilation  6/16 CT chest >> no PE, atherosclerosis, emphysema with interstitial fibrosis, small pericardial effusions, Rt base consolidation, foci of calcification in pericardium  Antibiotics:  daptomycin and meropenem (plan is to treat until 6/23)  HPI/Subjective: Patient low grade fever overnight and high fever throughout morning (102.3); reports breathing is stable; no CP.  Objective: Filed Vitals:   05/03/14 1417  BP:   Pulse:   Temp: 102.3 F (39.1 C)  Resp:     Intake/Output Summary (Last 24 hours) at 05/03/14 1705 Last data filed at 05/03/14 1412  Gross per 24 hour  Intake 1499.44 ml  Output   1725 ml  Net -225.56 ml   Filed Weights   05/01/14 0706 05/02/14 0342 05/03/14 0513  Weight: 85.004 kg (187 lb 6.4 oz) 84.7 kg (186 lb 11.7 oz) 86.5 kg (190 lb 11.2 oz)    Exam:   General:  NAD, denies CP, abd pain and nausea/vomiting. Patient reports breathing is stable  Cardiovascular: irregular, no rubs or gallops appreciated  Respiratory: no wheezing; decrease BS at bases  Abdomen: soft, NT, ND, positive BS  Musculoskeletal: bilateral Edema trace to 1+ (affecting upper extremities as well); no cyanosis  Data Reviewed: Basic Metabolic Panel:  Recent Labs Lab 04/26/14 2345 04/27/14 0500 04/28/14 0240 04/29/14 0412 04/30/14 0658 05/02/14 0310 05/03/14 0250  NA 142 144 138 144 144 145 135*  K 3.9 3.6* 3.1* 4.5 4.3 3.5* 3.6*  CL 98 104 100 108 107 90* 95*  CO2 31 30 25 25 25 31 29   GLUCOSE 93 117*  135* 134* 86 85 111*  BUN 18 16 13 12 13 8 9   CREATININE 0.77 0.63 0.50 0.63 0.78 0.76 0.70  CALCIUM 8.8 8.0* 8.3* 8.6 8.9 8.5 8.9  MG 2.3 2.2 2.3 2.6*  --   --   --   PHOS 3.8 3.3 2.0*  --   --   --   --    CBC:  Recent  Labs Lab 04/29/14 0412 04/30/14 0658 05/01/14 0400 05/02/14 0310 05/03/14 0250  WBC 4.3 9.7 7.7 8.4 8.2  HGB 8.7* 9.7* 8.7* 8.6* 8.7*  HCT 28.8* 31.8* 28.7* 28.0* 28.0*  MCV 86.5 86.2 85.2 85.4 84.6  PLT 171 176 161 150 148*   Cardiac Enzymes:  Recent Labs Lab 04/27/14 1313 04/27/14 1435 04/27/14 2126 05/03/14 0250  CKTOTAL  --   --   --  54  TROPONINI <0.30 <0.30 <0.30  --    BNP (last 3 results)  Recent Labs  02/06/14 0605 02/15/14 0640 04/30/14 0953  PROBNP 2426.0* 1292.0* 1579.0*   CBG:  Recent Labs Lab 05/02/14 1654 05/02/14 2100 05/03/14 0612 05/03/14 1115 05/03/14 1545  GLUCAP 141* 174* 97 107* 130*    Recent Results (from the past 240 hour(s))  CULTURE, BLOOD (ROUTINE X 2)     Status: None   Collection Time    04/25/14  6:05 PM      Result Value Ref Range Status   Specimen Description BLOOD LEFT ARM   Final   Special Requests BOTTLES DRAWN AEROBIC ONLY 10CC   Final   Culture  Setup Time     Final   Value: 04/26/2014 00:46     Performed at 04/28/2014   Culture     Final   Value: NO GROWTH 5 DAYS     Performed at [facility]   Report Status 05/02/2014 FINAL   Final  URINE CULTURE     Status: None   Collection Time    04/25/14  6:12 PM      Result Value Ref Range Status   Specimen Description URINE, CLEAN CATCH   Final   Special Requests NONE   Final   Culture  Setup Time     Final   Value: 04/25/2014 18:51     Performed at 04/27/2014 Count     Final   Value: NO GROWTH     Performed at [facility]   Culture     Final   Value: NO GROWTH     Performed at [facility]   Report Status 04/26/2014 FINAL   Final  CULTURE, BLOOD (ROUTINE X 2)     Status: None   Collection Time    04/25/14  6:12 PM      Result Value Ref Range Status   Specimen Description BLOOD LEFT HAND   Final   Special Requests BOTTLES DRAWN AEROBIC ONLY 10CC   Final   Culture  Setup Time     Final   Value:  04/26/2014 00:46     Performed at 04/28/2014   Culture     Final   Value: NO GROWTH 5 DAYS     Performed at [facility]   Report Status 05/02/2014 FINAL   Final  URINE CULTURE     Status: None   Collection Time    04/26/14 10:30 PM      Result Value Ref Range Status   Specimen Description URINE, CLEAN CATCH  Final   Special Requests NONE   Final   Culture  Setup Time     Final   Value: 04/27/2014 05:27     [facility]   Colony Count     Final   Value: NO GROWTH     [facility]   Culture     Final   Value: NO GROWTH     [facility]   Report Status 04/28/2014 FINAL   Final  CULTURE, BLOOD (ROUTINE X 2)     Status: None   Collection Time    04/26/14 11:45 PM      Result Value Ref Range Status   Specimen Description BLOOD LEFT ARM   Final   Special Requests BOTTLES DRAWN AEROBIC ONLY 2CC   Final   Culture  Setup Time     Final   Value: 04/27/2014 04:31     [facility]   Culture     Final   Value: NO GROWTH 5 DAYS     [facility]formed at [facility]   Report Status 05/03/2014 FINAL   Final  CULTURE, BLOOD (ROUTINE X 2)     Status: None   Collection Time    04/27/14 12:00 AM      Result Value Ref Range Status   Specimen Description BLOOD LEFT HAND   Final   Special Requests BOTTLES DRAWN AEROBIC ONLY 1CC   Final   Culture  Setup Time     Final   Value: 04/27/2014 04:29     [facility]   Culture     Final   Value: NO GROWTH 5 DAYS     [facility]formed at [facility]   Report Status 05/03/2014 FINAL   Final     Studies: Dg Chest Port 1 View  05/02/2014   CLINICAL DATA:  follow SOB  EXAM: PORTABLE CHEST - 1 VIEW  COMPARISON:  None.  FINDINGS: Low lung volumes. The heart size and mediastinal contours stable. Right central venous catheter via subclavian approach tip superior vena cava. There is increased density along the periphery of the right  lung base. Mild prominence of the interstitial markings is appreciated as well as mild central peribronchial cuffing. Linear areas increased density within the lung bases. Degenerative changes within the shoulders. No acute osseous abnormalities. Both lungs are clear. The visualized skeletal structures are unremarkable.  IMPRESSION: Density along the peripheral base right hemithorax differential considerations atelectasis versus infiltrate possibly loculated effusion. Surveillance evaluation recommended with PA and lateral chest radiograph.  Pulmonary vascular congestion/ edema  Atelectasis versus scarring within the lung bases.   Electronically Signed   By: Salome HolmesHector  Cooper M.D.   On: 05/02/2014 15:41    Scheduled Meds: . antiseptic oral rinse  15 mL Mouth Rinse q12n4p  . aspirin EC  81 mg Oral Daily  . bisoprolol  2.5 mg Oral Daily  . budesonide (PULMICORT) nebulizer solution  0.25 mg Nebulization BID  . collagenase   Topical Daily  . DAPTOmycin (CUBICIN)  IV  500 mg Intravenous Q24H  . folic acid  1 mg Oral Daily  . furosemide  20 mg Intravenous Daily  . insulin aspart  0-15 Units Subcutaneous TID WC  . lactobacillus acidophilus  1 tablet Oral BID  . lidocaine  1 patch Transdermal Daily  . meropenem (MERREM) IV  1 g Intravenous 3 times per day  . mirtazapine  15 mg Oral  QHS  . multivitamin with minerals  1 tablet Oral Daily  . pantoprazole  40 mg Oral Q1200  . predniSONE  10 mg Oral Q breakfast  . pregabalin  100 mg Oral TID  . senna  1 tablet Oral QHS  . sodium chloride  10-40 mL Intracatheter Q12H  . tamsulosin  0.4 mg Oral Daily  . vitamin C  250 mg Oral TID WC  . zinc sulfate  220 mg Oral Daily   Continuous Infusions: . sodium chloride 10 mL/hr at 05/03/14 1031  . heparin 1,550 Units/hr (05/03/14 1233)    Active Problems:   Sepsis   Hypotension   Encephalopathy acute   Mitral stenosis   Aortic stenosis   Atrial fibrillation   Pericardial calcification    Time spent:  >30 minutes    Vassie Loll  [facility] Pager [phone]. If 7PM-7AM, please contact night-coverage at [email or web address], password [facility] 05/03/2014, 5:05 PM  LOS: 7 days

## 2014-05-03 NOTE — Progress Notes (Signed)
Patient Name: Jon Lozano Date of Encounter: 05/03/2014     Active Problems:   Sepsis   Hypotension   Encephalopathy acute   Mitral stenosis   Aortic stenosis   Atrial fibrillation   Pericardial calcification    SUBJECTIVE  Feeling generally unwell. Tremendously weak. No palpitations or CP. He does feel SOB and has marked orthopnea.  CURRENT MEDS . antiseptic oral rinse  15 mL Mouth Rinse q12n4p  . aspirin EC  81 mg Oral Daily  . budesonide (PULMICORT) nebulizer solution  0.25 mg Nebulization BID  . collagenase   Topical Daily  . DAPTOmycin (CUBICIN)  IV  500 mg Intravenous Q24H  . folic acid  1 mg Oral Daily  . furosemide  20 mg Intravenous Daily  . insulin aspart  0-15 Units Subcutaneous TID WC  . lactobacillus acidophilus  1 tablet Oral BID  . lidocaine  1 patch Transdermal Daily  . meropenem (MERREM) IV  1 g Intravenous 3 times per day  . mirtazapine  15 mg Oral QHS  . multivitamin with minerals  1 tablet Oral Daily  . pantoprazole  40 mg Oral Q1200  . predniSONE  10 mg Oral Q breakfast  . pregabalin  100 mg Oral TID  . senna  1 tablet Oral QHS  . sodium chloride  10-40 mL Intracatheter Q12H  . tamsulosin  0.4 mg Oral Daily  . vitamin C  250 mg Oral TID WC  . zinc sulfate  220 mg Oral Daily    OBJECTIVE  Filed Vitals:   05/02/14 1541 05/02/14 2005 05/03/14 0513 05/03/14 0551  BP:  103/63 148/97   Pulse:  113 103   Temp:   100.8 F (38.2 C) 99.3 F (37.4 C)  TempSrc:  Oral Oral Oral  Resp:  18 18   Height:      Weight:   190 lb 11.2 oz (86.5 kg)   SpO2: 94% 95% 97%     Intake/Output Summary (Last 24 hours) at 05/03/14 0849 Last data filed at 05/03/14 0700  Gross per 24 hour  Intake 2349.02 ml  Output   1575 ml  Net 774.02 ml   Filed Weights   05/01/14 0706 05/02/14 0342 05/03/14 0513  Weight: 187 lb 6.4 oz (85.004 kg) 186 lb 11.7 oz (84.7 kg) 190 lb 11.2 oz (86.5 kg)    PHYSICAL EXAM  General: Pleasant, NAD. Seems lethargic. Cannot  move  Neuro: Alert and oriented X 3.  Psych: Normal affect. HEENT:  Normal.    Neck: Supple without bruits. + JVD. Lungs:  Resp regular and unlabored, Crackes at bases.  Heart: irreg irreg. +murmur Abdomen: Soft, non-tender, non-distended, BS + x 4.  Extremities: No clubbing, cyanosis. Warm LE. UE cool to touch. Trace LE edema  Accessory Clinical Findings  CBC  Recent Labs  05/02/14 0310 05/03/14 0250  WBC 8.4 8.2  HGB 8.6* 8.7*  HCT 28.0* 28.0*  MCV 85.4 84.6  PLT 150 148*   Basic Metabolic Panel  Recent Labs  05/02/14 0310 05/03/14 0250  NA 145 135*  K 3.5* 3.6*  CL 90* 95*  CO2 31 29  GLUCOSE 85 111*  BUN 8 9  CREATININE 0.76 0.70  CALCIUM 8.5 8.9   Cardiac Enzymes  Recent Labs  05/03/14 0250  CKTOTAL 54    TELE  Afib with RBBB. HR 115.  Has had periods of RVR throughout the AM  Radiology/Studies  Dg Chest 1 View  04/22/2014   CLINICAL DATA:  PICC line placement  EXAM: CHEST - 1 VIEW  COMPARISON:  Portable exam 1548 hr compared to 03/22/2014  FINDINGS: RIGHT arm PICC line tip projects over SVC.  Minimal enlargement of cardiac silhouette.  Mediastinal contours and pulmonary vascularity normal.  Mild persistent LEFT basilar infiltrate.  Improving aeration at RIGHT base.  No pleural effusion or pneumothorax.  IMPRESSION: Tip of RIGHT arm PICC line projects over mid SVC.  Persistent LEFT basilar and improving RIGHT basilar pulmonary infiltrates.   Electronically Signed   By: Ulyses Southward M.D.   On: 04/22/2014 17:14   Dg Chest 2 View  04/26/2014   CLINICAL DATA:  Hypoxia.  Question CHF  EXAM: CHEST  2 VIEW  COMPARISON:  04/25/2014  FINDINGS: Right arm PICC tip in the SVC unchanged.  Diffusely increased lung markings unchanged from the prior study. Question mild CHF versus chronic lung disease. No significant effusion.  IMPRESSION: Prominent lung markings are stable and may be due to chronic CHF or chronic lung disease and fibrosis. No superimposed edema or  effusion.   Electronically Signed   By: Marlan Palau M.D.   On: 04/26/2014 17:15   Dg Chest 2 View  04/25/2014   CLINICAL DATA:  Short of breath.  Fever.  Back pain.  EXAM: CHEST  2 VIEW  COMPARISON:  04/22/2014.  FINDINGS: Right upper extremity PICC is unchanged. The cardiopericardial silhouette remains enlarged. Bilateral basilar predominant airspace disease with diffuse interstitial prominence. Compared to the prior exam, there is no interval change aside from lower lung volumes. No pleural effusion.  IMPRESSION: 1. Unchanged right upper extremity PICC with the tip in the mid SVC. 2. Lower lung volumes with interstitial and alveolar basilar opacities.   Electronically Signed   By: Andreas Newport M.D.   On: 04/25/2014 19:27   Ct Angio Chest Pe W/cm &/or Wo Cm  04/27/2014   CLINICAL DATA:  Right lower extremity deep venous thrombosis  EXAM: CT ANGIOGRAPHY CHEST WITH CONTRAST  TECHNIQUE: Multidetector CT imaging of the chest was performed using the standard protocol during bolus administration of intravenous contrast. Multiplanar CT image reconstructions and MIPs were obtained to evaluate the vascular anatomy.  CONTRAST:  61mL OMNIPAQUE IOHEXOL 350 MG/ML SOLN  COMPARISON:  Chest CT February 04, 2014 and chest radiograph April 27, 2014  FINDINGS: There is no demonstrable pulmonary embolus. There is atherosclerotic change in the aorta. There is no demonstrable thoracic aortic aneurysm or dissection.  There is underlying emphysema with areas of interstitial fibrosis. There are small pleural effusions with what appears to be some interstitial edema superimposed on interstitial fibrotic change. There is an area of opacity in the posterior segment right upper lobe which appears to represent rounded atelectasis. There may be some superimposed pneumonia in this area. There is an area of airspace consolidation in the posterior right base which appears new from the prior study and is felt to represent localized pneumonia  in this area.  There is a small pericardial effusion. There are scattered foci of calcification within the pericardium. This finding is stable. The pericardium is also thickened on the left side with relative straightening of the left lateral heart border. There is the aortic valve and mitral annular calcification as well, stable from prior study.  There is no appreciable thoracic adenopathy. A borderline prominent superior right paratracheal lymph node is stable and most likely of reactive etiology.  The visualized upper abdominal structures appear normal except for atherosclerotic change. There are no blastic or lytic bone  lesions. Thyroid appears normal.  Review of the MIP images confirms the above findings.  IMPRESSION: No demonstrable pulmonary embolus.  Evidence of congestive heart failure superimposed on interstitial fibrosis. Note that there is a small pericardial effusion as well as scattered areas of pericardial calcification on the left. There is also extensive aortic valve and mitral annulus calcification, stable. 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 There appears to be some straightening of the lateral left heart border near the pericardium, a finding that could be indicative of constrictive pericarditis. Echocardiography could be helpful to further assess in this regard. Note that this appearance is essentially unchanged from prior CT examination.  Suspect rounded atelectasis in the posterior segment right upper lobe. There may be some superimposed infiltrate in this area. This appearance suggesting rounded atelectasis was also present on the previous study and is stable. There is also an area of localized pneumonia in the right base posteriorly.   Electronically Signed   By: Bretta Bang M.D.   On: 04/27/2014 10:28   Dg Chest Port 1 View  05/02/2014   CLINICAL DATA:  follow SOB  EXAM: PORTABLE CHEST - 1 VIEW  COMPARISON:  None.  FINDINGS: Low lung  volumes. The heart size and mediastinal contours stable. Right central venous catheter via subclavian approach tip superior vena cava. There is increased density along the periphery of the right lung base. Mild prominence of the interstitial markings is appreciated as well as mild central peribronchial cuffing. Linear areas increased density within the lung bases. Degenerative changes within the shoulders. No acute osseous abnormalities. Both lungs are clear. The visualized skeletal structures are unremarkable.  IMPRESSION: Density along the peripheral base right hemithorax differential considerations atelectasis versus infiltrate possibly loculated effusion. Surveillance evaluation recommended with PA and lateral chest radiograph.  Pulmonary vascular congestion/ edema  Atelectasis versus scarring within the lung bases.   Electronically Signed   By: Salome Holmes M.D.   On: 05/02/2014 15:41   Dg Chest Port 1 View  04/29/2014   CLINICAL DATA:  Followup pneumonia  EXAM: PORTABLE CHEST - 1 VIEW  COMPARISON:  04/27/2014  FINDINGS: Mild left lower lobe atelectasis/ infiltrate unchanged. Right lung remains clear. Negative for heart failure or effusion. Right arm PICC tip in the SVC.  IMPRESSION: No significant change left lower lobe atelectasis/infiltrate   Electronically Signed   By: Marlan Palau M.D.   On: 04/29/2014 08:04   Dg Chest Port 1 View  04/27/2014   CLINICAL DATA:  Followup prominent lung markings  EXAM: PORTABLE CHEST - 1 VIEW  COMPARISON:  Portable exam 0620 hr compared to 04/26/2014  FINDINGS: RIGHT arm PICC line tip projects over SVC.  Rotated to the RIGHT.  Enlargement of cardiac silhouette.  Mediastinal contours and pulmonary vascularity normal.  Persistent accentuation of interstitial markings similar to previous exam.  Minimal infiltrate or atelectasis at LEFT base.  Lungs otherwise clear.  No pleural effusion or pneumothorax.  Chronic RIGHT rotator cuff tear.  IMPRESSION: Mild enlargement of  cardiac silhouette.  Chronic accentuation of interstitial markings.  Minimal atelectasis versus infiltrate at LEFT base.   Electronically Signed   By: Ulyses Southward M.D.   On: 04/27/2014 08:14    ASSESSMENT AND PLAN KOHEI ANTONELLIS is a 73 y.o. 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    Atrial fibrillation: Currently on heparin. -- Heart rate 115 bmp currently. Has had periods of RVR throughout the AM -- Not on any rate control. LIkely due to previous hypotension. 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 Antoine Poche note yesterday- the plan is to consider right heart cath when other medical issues are improved. Also plan repeat echo once heart rate is improved-   Acute on chronic diastolic CHF: still with s/s of CHF.  -- Currently on IV Lasix 20mg . Creat normal. Consider more aggressive diuresis.  -- Weights all over the board- (+4lbs from yesterday). Net positive 5L  HTN- blood pressure improved, previously hypotensive.   -- Patient off pressors since 6/18  Anemia: Hbg stable. Per primary team.   COPD/Emphysema: currently no wheezing  -- Per pulmonary service plan is to continue bronchial hygiene, pulmicort and PRN bronchodilators (xopenex given A. Fib)  RA: continue prednisone  Dysphagia/deconditioning: as per SPT rec's will continue nectar thick liquids and heart healthy diet.   Dispo- patient to be discharge to SNF vs LTAC for rehabilitation once medically stable   Urban Gibson PA-C  Pager [phone]  Attending Note:   The patient was seen and examined.  Agree with assessment and plan as noted above.  Changes made to the above note as needed.  AT this point, I would not consider him a candidate for cardiac cath.  He is  unable to move around in bed - has limited / no mobility.    He needs to gain lots more mobility and strength prior to being considered for invasive diagnostic cardiac procedures.  I   His HR is still fast.  Will add low dose bisoprolol to help slow his ventricular rate.   Will get another echo once his HR is slower.    Vesta Mixer, Montez Hageman., MD, [facility] 05/03/2014, 10:09 AM

## 2014-05-03 NOTE — Progress Notes (Signed)
ANTICOAGULATION & ANTIBIOTIC CONSULT NOTE - Follow Up Consult  Pharmacy Consult for Heparin & Daptomycin + Meropenem Indication: New DVT/hx Afib & Hardware infection/bacteremia  No Known Allergies  Patient Measurements: Height: 5\' 8"  (172.7 cm) Weight: 190 lb 11.2 oz (86.5 kg) IBW/kg (Calculated) : 68.4 Heparin Dosing Weight: 85kg  Vital Signs: Temp: 99.3 F (37.4 C) (06/22 0551) Temp src: Oral (06/22 0551) BP: 148/97 mmHg (06/22 0513) Pulse Rate: 103 (06/22 0513)  Labs:  Recent Labs  05/01/14 0400 05/02/14 0310 05/02/14 1108 05/02/14 2115 05/03/14 0250  HGB 8.7* 8.6*  --   --  8.7*  HCT 28.7* 28.0*  --   --  28.0*  PLT 161 150  --   --  148*  HEPARINUNFRC 0.30 0.19* 0.28* 0.41 0.46  CREATININE  --  0.76  --   --  0.70  CKTOTAL  --   --   --   --  54    Estimated Creatinine Clearance: 90.6 ml/min (by C-G formula based on Cr of 0.7).  Medications:  Heparin @ 1550 units/hr Daptomycin 500 mg/24h Meropenem 1g/8h  Assessment: 73 yo M who continues on heparin for RLE DVT and hx Afib with a therapeutic heparin level this morning (HL 0.46 << 0.41, goal of 0.3-0.7). CBC low but stable - no overt s/sx of bleeding noted. Will f/u plans for oral anticoagulation pending completion of cardiology procedures (ECHO, cath).   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 At SNF he developed purulent drainage on 03/10/14 with wound cultures positive for VRE. Hardware was removed by Dr. 03/12/14 and [facility] placed with recommendations for daptomycin/cefepime for 8 weeks of antibiotic therapy. Cefepime changed to Merrem on 03/26/14 for Kleb. Plan to con't abx through 05/04/14. The patient's last CK on 6/22 was 54, will plan to check weekly CK levels while on Dapto. Renal function stable - doses appropriate  Noted antibiotics intended to be stopped on 6/23 - discussed with Dr. 7/23 who will  discuss with ID to ensure this LOT is still appropriate.   Goal of Therapy:  Heparin level 0.3-0.7 units/ml Monitor platelets by anticoagulation protocol: Yes Proper antibiotics for infection/cultures adjusted for renal/hepatic function    Plan:  1. Continue heparin at the current rate of 1550 units/hr (15.5 ml/hr) 2. Continue Daptomycin 500 mg (~6 mg/kg) IV every 24 hours 3. Continue Meropenem 1g IV every 8 hours 4. Will f/u plans for oral anticoagulation, and antibiotic LOT 5. Will continue to monitor for any signs/symptoms of bleeding and will follow up with heparin level in the a.m.   Gwenlyn Perking, PharmD, BCPS Clinical Pharmacist Pager: [phone] 05/03/2014 10:16 AM

## 2014-05-04 DIAGNOSIS — I2789 Other specified pulmonary heart diseases: Secondary | ICD-10-CM

## 2014-05-04 LAB — CBC
HEMATOCRIT: 23.8 % — AB (ref 39.0–52.0)
HEMOGLOBIN: 7.5 g/dL — AB (ref 13.0–17.0)
MCH: 26.6 pg (ref 26.0–34.0)
MCHC: 31.5 g/dL (ref 30.0–36.0)
MCV: 84.4 fL (ref 78.0–100.0)
Platelets: 188 10*3/uL (ref 150–400)
RBC: 2.82 MIL/uL — AB (ref 4.22–5.81)
RDW: 16.7 % — ABNORMAL HIGH (ref 11.5–15.5)
WBC: 7.1 10*3/uL (ref 4.0–10.5)

## 2014-05-04 LAB — GLUCOSE, CAPILLARY
Glucose-Capillary: 90 mg/dL (ref 70–99)
Glucose-Capillary: 110 mg/dL — ABNORMAL HIGH (ref 70–99)
Glucose-Capillary: 120 mg/dL — ABNORMAL HIGH (ref 70–99)
Glucose-Capillary: 137 mg/dL — ABNORMAL HIGH (ref 70–99)

## 2014-05-04 LAB — HEPARIN LEVEL (UNFRACTIONATED): Heparin Unfractionated: 0.33 IU/mL (ref 0.30–0.70)

## 2014-05-04 MED ORDER — VANCOMYCIN HCL IN DEXTROSE 1-5 GM/200ML-% IV SOLN
1000.0000 mg | Freq: Three times a day (TID) | INTRAVENOUS | Status: DC
  Administered 2014-05-04 – 2014-05-06 (×5): 1000 mg via INTRAVENOUS
  Filled 2014-05-04 (×7): qty 200

## 2014-05-04 MED ORDER — VANCOMYCIN HCL 10 G IV SOLR
1250.0000 mg | Freq: Once | INTRAVENOUS | Status: AC
  Administered 2014-05-04: 1250 mg via INTRAVENOUS
  Filled 2014-05-04: qty 1250

## 2014-05-04 MED ORDER — JUVEN PO PACK
1.0000 | PACK | Freq: Two times a day (BID) | ORAL | Status: DC
  Administered 2014-05-05 – 2014-05-10 (×10): 1 via ORAL
  Filled 2014-05-04 (×14): qty 1

## 2014-05-04 MED ORDER — PRO-STAT SUGAR FREE PO LIQD
30.0000 mL | Freq: Three times a day (TID) | ORAL | Status: DC
  Administered 2014-05-05 – 2014-05-08 (×10): 30 mL via ORAL
  Administered 2014-05-09 (×2): via ORAL
  Administered 2014-05-10 – 2014-05-12 (×5): 30 mL via ORAL
  Filled 2014-05-04 (×25): qty 30

## 2014-05-04 MED ORDER — SODIUM CHLORIDE 0.9 % IV SOLN
1.0000 g | Freq: Three times a day (TID) | INTRAVENOUS | Status: DC
  Administered 2014-05-04 – 2014-05-12 (×24): 1 g via INTRAVENOUS
  Filled 2014-05-04 (×27): qty 1

## 2014-05-04 MED ORDER — BOOST / RESOURCE BREEZE PO LIQD
1.0000 | ORAL | Status: DC
  Administered 2014-05-05 – 2014-05-12 (×6): 1 via ORAL

## 2014-05-04 NOTE — Progress Notes (Signed)
Physical Therapy Treatment Patient Details Name: Jon Lozano MRN: [id number] DOB: 25-Mar-1941 Today's Date: 05/04/2014    History of Present Illness Pt originally had back surgery in [place] in February complicated by hardware infection with MRSA, VRE, and proteus bacteremia. Pt was transferred to [facility] and then dc'd to SNF on 03/10/14. Pt readmitted to [facility] on 03/22/14 after removal of hardware. Transferred to CIR on 04/21/14. Developed new fever, lethargy, and altered mental status on 04/26/14 and transferred to ICU.Pt with HCAP and septic shock.    PT Comments    Pt somewhat disoriented to situation this session. States he is paralyzed from the waist down and cannot assist in any mobility, however was later able to actively perform ROM for bed mobility and therapeutic exercise. In bed, range is limited, however was able to perform LAQ's while sitting. Pt refusing to sit with LE's elevated, and leg rest was left in the closed position at end of session. Will continue to follow.   Follow Up Recommendations  SNF;Supervision/Assistance - 24 hour     Equipment Recommendations  Hospital bed (Mechanical lift)    Recommendations for Other Services       Precautions / Restrictions Precautions Precautions: Fall Restrictions Weight Bearing Restrictions: No    Mobility  Bed Mobility Overal bed mobility: Needs Assistance Bed Mobility: Rolling Rolling: Max assist         General bed mobility comments: Pt cued for sequencing and technique. Hand-over-hand assist to reach for bed rails. Physical assist for full rolling bilaterally.   Transfers Overall transfer level: Needs assistance Equipment used:  (Maxi-move)             General transfer comment: Bed to recliner achieved with use of maxi-move.   Ambulation/Gait                 Stairs            Wheelchair Mobility    Modified Rankin (Stroke Patients Only)       Balance   Sitting-balance support:  Bilateral upper extremity supported Sitting balance-Leahy Scale: Poor   Postural control: Right lateral lean                          Cognition Arousal/Alertness: Awake/alert Behavior During Therapy: WFL for tasks assessed/performed Overall Cognitive Status: Within Functional Limits for tasks assessed                      Exercises General Exercises - Lower Extremity Long Arc Quad: 10 reps    General Comments        Pertinent Vitals/Pain Vitals stable throughout session.     Home Living                      Prior Function            PT Goals (current goals can now be found in the care plan section) Acute Rehab PT Goals Patient Stated Goal: To go to some kind of rehab PT Goal Formulation: With patient Time For Goal Achievement: 05/13/14 Potential to Achieve Goals: Fair Progress towards PT goals: Not progressing toward goals - comment    Frequency  Min 2X/week    PT Plan Frequency needs to be updated    Co-evaluation             End of Session Equipment Utilized During Treatment: Oxygen (Maxi-move) Activity Tolerance: Patient limited by fatigue Patient  left: in chair;with call bell/phone within reach     Time: 1340-1419 PT Time Calculation (min): 39 min  Charges:  $Therapeutic Activity: 38-52 mins                    G Codes:      Ruthann Cancer 05-10-2014, 4:11 PM  Ruthann Cancer, PT, DPT Acute Rehabilitation Services Pager: [phone]

## 2014-05-04 NOTE — Progress Notes (Addendum)
Patient Name: HULON FERRON Date of Encounter: 05/04/2014     Active Problems:   Sepsis   Hypotension   Encephalopathy acute   Mitral stenosis   Aortic stenosis   Atrial fibrillation   Pericardial calcification    SUBJECTIVE  Patient still tremendously weak. He cannot even pull on the railing in order to auscultate his lungs. He reports that he cannot move his legs. He says that he wasn't nearly this bad a weak ago. He does feeling improved in terms of SOB and orthopnea. He was able to sleep some last night.   CURRENT MEDS . antiseptic oral rinse  15 mL Mouth Rinse q12n4p  . aspirin EC  81 mg Oral Daily  . bisoprolol  2.5 mg Oral Daily  . budesonide (PULMICORT) nebulizer solution  0.25 mg Nebulization BID  . collagenase   Topical Daily  . DAPTOmycin (CUBICIN)  IV  500 mg Intravenous Q24H  . folic acid  1 mg Oral Daily  . furosemide  20 mg Intravenous Daily  . insulin aspart  0-15 Units Subcutaneous TID WC  . lactobacillus acidophilus  1 tablet Oral BID  . lidocaine  1 patch Transdermal Daily  . meropenem (MERREM) IV  1 g Intravenous 3 times per day  . mirtazapine  15 mg Oral QHS  . multivitamin with minerals  1 tablet Oral Daily  . pantoprazole  40 mg Oral Q1200  . predniSONE  10 mg Oral Q breakfast  . pregabalin  100 mg Oral TID  . senna  1 tablet Oral QHS  . sodium chloride  10-40 mL Intracatheter Q12H  . tamsulosin  0.4 mg Oral Daily  . vitamin C  250 mg Oral TID WC  . zinc sulfate  220 mg Oral Daily    OBJECTIVE  Filed Vitals:   05/03/14 2009 05/04/14 0211 05/04/14 0232 05/04/14 0456  BP:  122/80  110/67  Pulse:  84  77  Temp:   101.1 F (38.4 C) 97.7 F (36.5 C)  TempSrc:   Axillary Axillary  Resp:  17  19  Height:      Weight:    184 lb 15.5 oz (83.9 kg)  SpO2: 94% 100%  99%    Intake/Output Summary (Last 24 hours) at 05/04/14 0835 Last data filed at 05/04/14 0744  Gross per 24 hour  Intake 659.33 ml  Output   1825 ml  Net -1165.67 ml    Filed Weights   05/02/14 0342 05/03/14 0513 05/04/14 0456  Weight: 186 lb 11.7 oz (84.7 kg) 190 lb 11.2 oz (86.5 kg) 184 lb 15.5 oz (83.9 kg)    PHYSICAL EXAM  General: Pleasant, NAD. Seems lethargic. Cannot move  Neuro: Alert and oriented X 3.  Psych: Normal affect. HEENT:  Normal.    Neck: Supple without bruits. + JVD. Lungs:  Resp regular and unlabored, Crackes at bases.  Heart: irreg irreg. +murmur Abdomen: Soft, non-tender, non-distended, BS + x 4.  Extremities: No clubbing, cyanosis. Warm LE. UE cool to touch. Trace LE edema  Accessory Clinical Findings  CBC  Recent Labs  05/03/14 0250 05/04/14 0406  WBC 8.2 7.1  HGB 8.7* 7.5*  HCT 28.0* 23.8*  MCV 84.6 84.4  PLT 148* 188   Basic Metabolic Panel  Recent Labs  05/02/14 0310 05/03/14 0250  NA 145 135*  K 3.5* 3.6*  CL 90* 95*  CO2 31 29  GLUCOSE 85 111*  BUN 8 9  CREATININE 0.76 0.70  CALCIUM 8.5  8.9   Cardiac Enzymes  Recent Labs  05/03/14 0250  CKTOTAL 54    TELE  Afib with CVR vs NR with PACS  Radiology/Studies  Dg Chest 1 View  04/22/2014   CLINICAL DATA:  PICC line placement  EXAM: CHEST - 1 VIEW  COMPARISON:  Portable exam 1548 hr compared to 03/22/2014  FINDINGS: RIGHT arm PICC line tip projects over SVC.  Minimal enlargement of cardiac silhouette.  Mediastinal contours and pulmonary vascularity normal.  Mild persistent LEFT basilar infiltrate.  Improving aeration at RIGHT base.  No pleural effusion or pneumothorax.  IMPRESSION: Tip of RIGHT arm PICC line projects over mid SVC.  Persistent LEFT basilar and improving RIGHT basilar pulmonary infiltrates.   Electronically Signed   By: Ulyses Southward M.D.   On: 04/22/2014 17:14   Dg Chest 2 View  04/26/2014   CLINICAL DATA:  Hypoxia.  Question CHF  EXAM: CHEST  2 VIEW  COMPARISON:  04/25/2014  FINDINGS: Right arm PICC tip in the SVC unchanged.  Diffusely increased lung markings unchanged from the prior study. Question mild CHF versus chronic  lung disease. No significant effusion.  IMPRESSION: Prominent lung markings are stable and may be due to chronic CHF or chronic lung disease and fibrosis. No superimposed edema or effusion.   Electronically Signed   By: Marlan Palau M.D.   On: 04/26/2014 17:15   Dg Chest 2 View  04/25/2014   CLINICAL DATA:  Short of breath.  Fever.  Back pain.  EXAM: CHEST  2 VIEW  COMPARISON:  04/22/2014.  FINDINGS: Right upper extremity PICC is unchanged. The cardiopericardial silhouette remains enlarged. Bilateral basilar predominant airspace disease with diffuse interstitial prominence. Compared to the prior exam, there is no interval change aside from lower lung volumes. No pleural effusion.  IMPRESSION: 1. Unchanged right upper extremity PICC with the tip in the mid SVC. 2. Lower lung volumes with interstitial and alveolar basilar opacities.   Electronically Signed   By: Andreas Newport M.D.   On: 04/25/2014 19:27   Ct Angio Chest Pe W/cm &/or Wo Cm  04/27/2014   CLINICAL DATA:  Right lower extremity deep venous thrombosis  EXAM: CT ANGIOGRAPHY CHEST WITH CONTRAST  TECHNIQUE: Multidetector CT imaging of the chest was performed using the standard protocol during bolus administration of intravenous contrast. Multiplanar CT image reconstructions and MIPs were obtained to evaluate the vascular anatomy.  CONTRAST:  81mL OMNIPAQUE IOHEXOL 350 MG/ML SOLN  COMPARISON:  Chest CT February 04, 2014 and chest radiograph April 27, 2014  FINDINGS: There is no demonstrable pulmonary embolus. There is atherosclerotic change in the aorta. There is no demonstrable thoracic aortic aneurysm or dissection.  There is underlying emphysema with areas of interstitial fibrosis. There are small pleural effusions with what appears to be some interstitial edema superimposed on interstitial fibrotic change. There is an area of opacity in the posterior segment right upper lobe which appears to represent rounded atelectasis. There may be some  superimposed pneumonia in this area. There is an area of airspace consolidation in the posterior right base which appears new from the prior study and is felt to represent localized pneumonia in this area.  There is a small pericardial effusion. There are scattered foci of calcification within the pericardium. This finding is stable. The pericardium is also thickened on the left side with relative straightening of the left lateral heart border. There is the aortic valve and mitral annular calcification as well, stable from prior study.  There  is no appreciable thoracic adenopathy. A borderline prominent superior right paratracheal lymph node is stable and most likely of reactive etiology.  The visualized upper abdominal structures appear normal except for atherosclerotic change. There are no blastic or lytic bone lesions. Thyroid appears normal.  Review of the MIP images confirms the above findings.  IMPRESSION: No demonstrable pulmonary embolus.  Evidence of congestive heart failure superimposed on interstitial fibrosis. Note that there is a small pericardial effusion as well as scattered areas of pericardial calcification on the left. There is also extensive aortic valve and mitral annulus calcification, stable. 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 There appears to be some straightening of the lateral left heart border near the pericardium, a finding that could be indicative of constrictive pericarditis. Echocardiography could be helpful to further assess in this regard. Note that this appearance is essentially unchanged from prior CT examination.  Suspect rounded atelectasis in the posterior segment right upper lobe. There may be some superimposed infiltrate in this area. This appearance suggesting rounded atelectasis was also present on the previous study and is stable. There is also an area of localized pneumonia in the right base posteriorly.   Electronically  Signed   By: Bretta Bang M.D.   On: 04/27/2014 10:28   Dg Chest Port 1 View  05/02/2014   CLINICAL DATA:  follow SOB  EXAM: PORTABLE CHEST - 1 VIEW  COMPARISON:  None.  FINDINGS: Low lung volumes. The heart size and mediastinal contours stable. Right central venous catheter via subclavian approach tip superior vena cava. There is increased density along the periphery of the right lung base. Mild prominence of the interstitial markings is appreciated as well as mild central peribronchial cuffing. Linear areas increased density within the lung bases. Degenerative changes within the shoulders. No acute osseous abnormalities. Both lungs are clear. The visualized skeletal structures are unremarkable.  IMPRESSION: Density along the peripheral base right hemithorax differential considerations atelectasis versus infiltrate possibly loculated effusion. Surveillance evaluation recommended with PA and lateral chest radiograph.  Pulmonary vascular congestion/ edema  Atelectasis versus scarring within the lung bases.   Electronically Signed   By: Salome Holmes M.D.   On: 05/02/2014 15:41   Dg Chest Port 1 View  04/29/2014   CLINICAL DATA:  Followup pneumonia  EXAM: PORTABLE CHEST - 1 VIEW  COMPARISON:  04/27/2014  FINDINGS: Mild left lower lobe atelectasis/ infiltrate unchanged. Right lung remains clear. Negative for heart failure or effusion. Right arm PICC tip in the SVC.  IMPRESSION: No significant change left lower lobe atelectasis/infiltrate   Electronically Signed   By: Marlan Palau M.D.   On: 04/29/2014 08:04   Dg Chest Port 1 View  04/27/2014   CLINICAL DATA:  Followup prominent lung markings  EXAM: PORTABLE CHEST - 1 VIEW  COMPARISON:  Portable exam 0620 hr compared to 04/26/2014  FINDINGS: RIGHT arm PICC line tip projects over SVC.  Rotated to the RIGHT.  Enlargement of cardiac silhouette.  Mediastinal contours and pulmonary vascularity normal.  Persistent accentuation of interstitial markings similar  to previous exam.  Minimal infiltrate or atelectasis at LEFT base.  Lungs otherwise clear.  No pleural effusion or pneumothorax.  Chronic RIGHT rotator cuff tear.  IMPRESSION: Mild enlargement of cardiac silhouette.  Chronic accentuation of interstitial markings.  Minimal atelectasis versus infiltrate at LEFT base.   Electronically Signed   By: Ulyses Southward M.D.   On: 04/27/2014 08:14    ASSESSMENT AND PLAN  DELMAR ARRIAGA is a 73 y.o. 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    Atrial fibrillation: Currently on heparin. 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 He needs to gain lots more mobility and strength prior to being considered for invasive diagnostic cardiac procedures per Dr. Elease Hashimoto -- Plan on repeat echo once heart rate is improved- this may be possible today  Acute on chronic diastolic CHF: still with s/s of CHF.  -- Currently on IV Lasix 20mg . Creat normal.  -- Weights all over the board. Down 6lbs from yesterday. Net positive 3.8L since admission but also negative ~1L since yesterday  HTN- blood pressure improved, previously hypotensive.   -- Patient off pressors since 6/18. His BP is stable with the addition of bisoprolol  Anemia: Hbg stable. Per primary team.   COPD/Emphysema: currently no wheezing  -- Per pulmonary service plan is to continue bronchial hygiene, pulmicort and PRN bronchodilators (xopenex given A. Fib)  RA: continue prednisone  Dysphagia/deconditioning: as per SPT rec's will continue nectar thick liquids and heart healthy  diet.   Dispo- patient to be discharge to SNF vs LTAC for rehabilitation once medically stable   7/18 PA-C  Pager [place]   Attending Note:   The patient was seen and examined.  Agree with assessment and plan as noted above.  Changes made to the above note as needed.   Repeat ecg . We may need to increae bisoprolol to get HR better controlled.  He appears to have a 1st degree block on tele.   The repeat ECG will help [id number] evaluate that.   He has pulmonary hypertension and hx of DVT.   CT of the lungs reveals emphasemia with interstitial fibrosis. Scattered calcification of the pericardium.   AT this point, he is not a candidate for cardiac cath.    [place], Vesta Mixer., MD, [facility] 05/04/2014, 10:21 AM

## 2014-05-04 NOTE — Progress Notes (Signed)
[facility] PROGRESS NOTE  Jon PROUDFOOT RXY:[id number] DOB: [birth year] DOA: 04/26/2014 PCP: Pcp Not In System  Assessment/Plan: 1-acute resp failure with hypoxia: due to HCPA. Mild diastolic heart failure contributing along with COPD -much better  -patient on 2L oxygen currently and with good O2 sat -continue antibiotics as dictated/recommended by ID; they will follow patient after termination of abx's if still spiking fevers. -will continue flutter valve/IS -continue pulmicort and PRN xopenex -continue low dose lasix  2-COPD/Emphysema: currently no wheezing -per pulmonary service plan is to continue bronchial hygiene, pulmicort and PRN bronchodilators (xopenex given A. Fib)  3-septic shock: now resolved -BP stable  -patient off pressors since 6/18 -will monitor vital signs closely  4-atrial fibrillation and pericardial calcification/constrictive pericarditis: -per cardiology; 2-D echo in am -continue heparin drip for now --plan is once more stable, pursuit right heart cath to better assess/ evaluate pericardial constriction -HR has been all over the place; cardiology recommended bisoprolol and is helping adjusting the dose for better HR controlled.  5-RLE DVT: continue heparin drip -eventually will benefit of xarelto therapy; will follow cardiology rec's on that  6-Urinary retention: most likely associated with BPH -started on flomax approx 5 days ago -voiding trials were successful and foley has been removed   7-GERD: continue PPI  8-RA: continue prednisone  9-acute toxic encephalopathy: due to infection -now resolved -patient AAOX3.  10-dysphagia/deconditioning: as per SPT rec's will continue nectar thick liquids and heart healthy diet. -patient to be discharge to SNF vs LTAC for rehabilitation once medically stable  11-ongoing fever: discussed with ID (Dr. Ilsa Iha) -recommendation to continue current abx's therapy and to stop on 6/23 as initially  recommended -blood cx's has been repeated on 6/22 and pending -urine culture done on 6/23  12-severe protein calorie malnutrition: will follow nutritional service rec's   Code Status: full  Family Communication: no family at bedside Disposition Plan: SNF vs LTAC when medically stable   Consultants:  PCCM  Cardiology   Procedures: 6/10 Doppler legs >> partial acute DVT Rt common femoral vein  6/16 Echo >> mild LVH, EF 65 to 70%, grade 3 diastolic dysfx, mild AS, mild/mod MS, mod LA dilation  6/16 CT chest >> no PE, atherosclerosis, emphysema with interstitial fibrosis, small pericardial effusions, Rt base consolidation, foci of calcification in pericardium  Antibiotics:  daptomycin and meropenem (plan is to treat until 6/23)  HPI/Subjective: Patient continue spiking fever; reports breathing is stable; no CP.  Objective: Filed Vitals:   05/04/14 1406  BP: 101/66  Pulse: 90  Temp: 99.9 F (37.7 C)  Resp: 20    Intake/Output Summary (Last 24 hours) at 05/04/14 1733 Last data filed at 05/04/14 1450  Gross per 24 hour  Intake 769.33 ml  Output   1400 ml  Net -630.67 ml   Filed Weights   05/02/14 0342 05/03/14 0513 05/04/14 0456  Weight: 84.7 kg (186 lb 11.7 oz) 86.5 kg (190 lb 11.2 oz) 83.9 kg (184 lb 15.5 oz)    Exam:   General:  Still spiking fever; NAD, denies CP, abd pain and nausea/vomiting. Patient reports breathing is stable  Cardiovascular: irregular, no rubs or gallops appreciated  Respiratory: no wheezing; decrease BS at bases  Abdomen: soft, NT, ND, positive BS  Musculoskeletal: bilateral Edema trace to 1+ (affecting upper extremities as well); no cyanosis  Data Reviewed: Basic Metabolic Panel:  Recent Labs Lab 04/28/14 0240 04/29/14 0412 04/30/14 0658 05/02/14 0310 05/03/14 0250  NA 138 144 144 145 135*  K 3.1*  4.5 4.3 3.5* 3.6*  CL 100 108 107 90* 95*  CO2 25 25 25 31 29   GLUCOSE 135* 134* 86 85 111*  BUN 13 12 13 8 9   CREATININE  0.50 0.63 0.78 0.76 0.70  CALCIUM 8.3* 8.6 8.9 8.5 8.9  MG 2.3 2.6*  --   --   --   PHOS 2.0*  --   --   --   --    CBC:  Recent Labs Lab 04/30/14 0658 05/01/14 0400 05/02/14 0310 05/03/14 0250 05/04/14 0406  WBC 9.7 7.7 8.4 8.2 7.1  HGB 9.7* 8.7* 8.6* 8.7* 7.5*  HCT 31.8* 28.7* 28.0* 28.0* 23.8*  MCV 86.2 85.2 85.4 84.6 84.4  PLT 176 161 150 148* 188   Cardiac Enzymes:  Recent Labs Lab 04/27/14 2126 05/03/14 0250  CKTOTAL  --  54  TROPONINI <0.30  --    BNP (last 3 results)  Recent Labs  02/06/14 0605 02/15/14 0640 04/30/14 0953  PROBNP 2426.0* 1292.0* 1579.0*   CBG:  Recent Labs Lab 05/03/14 1545 05/03/14 2137 05/04/14 0531 05/04/14 1114 05/04/14 1645  GLUCAP 130* 113* 90 110* 120*    Recent Results (from the past 240 hour(s))  CULTURE, BLOOD (ROUTINE X 2)     Status: None   Collection Time    04/25/14  6:05 PM      Result Value Ref Range Status   Specimen Description BLOOD LEFT ARM   Final   Special Requests BOTTLES DRAWN AEROBIC ONLY 10CC   Final   Culture  Setup Time     Final   Value: 04/26/2014 00:46     Performed at [facility]   Culture     Final   Value: NO GROWTH 5 DAYS     Performed at [facility]   Report Status 05/02/2014 FINAL   Final  URINE CULTURE     Status: None   Collection Time    04/25/14  6:12 PM      Result Value Ref Range Status   Specimen Description URINE, CLEAN CATCH   Final   Special Requests NONE   Final   Culture  Setup Time     Final   Value: 04/25/2014 18:51     Performed at [facility] Count     Final   Value: NO GROWTH     Performed at [facility]   Culture     Final   Value: NO GROWTH     Performed at [facility]   Report Status 04/26/2014 FINAL   Final  CULTURE, BLOOD (ROUTINE X 2)     Status: None   Collection Time    04/25/14  6:12 PM      Result Value Ref Range Status   Specimen Description BLOOD LEFT HAND   Final   Special Requests BOTTLES  DRAWN AEROBIC ONLY 10CC   Final   Culture  Setup Time     Final   Value: 04/26/2014 00:46     Performed at [facility]   Culture     Final   Value: NO GROWTH 5 DAYS     Performed at [facility]   Report Status 05/02/2014 FINAL   Final  URINE CULTURE     Status: None   Collection Time    04/26/14 10:30 PM      Result Value Ref Range Status   Specimen Description URINE, CLEAN CATCH   Final  Special Requests NONE   Final   Culture  Setup Time     Final   Value: 04/27/2014 05:27     [facility]   [place] Count     Final   Value: NO GROWTH     [facility]   Culture     Final   Value: NO GROWTH     [facility]   Report Status 04/28/2014 FINAL   Final  CULTURE, BLOOD (ROUTINE X 2)     Status: None   Collection Time    04/26/14 11:45 PM      Result Value Ref Range Status   Specimen Description BLOOD LEFT ARM   Final   Special Requests BOTTLES DRAWN AEROBIC ONLY 2CC   Final   Culture  Setup Time     Final   Value: 04/27/2014 04:31     [facility]   Culture     Final   Value: NO GROWTH 5 DAYS     [facility]   Report Status 05/03/2014 FINAL   Final  CULTURE, BLOOD (ROUTINE X 2)     Status: None   Collection Time    04/27/14 12:00 AM      Result Value Ref Range Status   Specimen Description BLOOD LEFT HAND   Final   Special Requests BOTTLES DRAWN AEROBIC ONLY 1CC   Final   Culture  Setup Time     Final   Value: 04/27/2014 04:29     [facility]   Culture     Final   Value: NO GROWTH 5 DAYS     [facility]   Report Status 05/03/2014 FINAL   Final  CULTURE, BLOOD (ROUTINE X 2)     Status: None   Collection Time    05/03/14  3:01 PM      Result Value Ref Range Status   Specimen Description BLOOD LEFT ARM   Final   Special Requests BOTTLES DRAWN AEROBIC AND ANAEROBIC 10CC   Final   Culture  Setup Time      Final   Value: 05/03/2014 18:56     [facility]   Culture     Final   Value:        BLOOD CULTURE RECEIVED NO GROWTH TO DATE CULTURE WILL BE HELD FOR 5 DAYS BEFORE ISSUING A FINAL NEGATIVE REPORT     [facility]   Report Status PENDING   Incomplete  CULTURE, BLOOD (ROUTINE X 2)     Status: None   Collection Time    05/03/14  3:14 PM      Result Value Ref Range Status   Specimen Description BLOOD LEFT HAND   Final   Special Requests BOTTLES DRAWN AEROBIC ONLY 3CC   Final   Culture  Setup Time     Final   Value: 05/03/2014 18:58     [facility]   Culture     Final   Value:        BLOOD CULTURE RECEIVED NO GROWTH TO DATE CULTURE WILL BE HELD FOR 5 DAYS BEFORE ISSUING A FINAL NEGATIVE REPORT     [facility]   Report Status PENDING   Incomplete     Studies: No results found.  Scheduled Meds: . antiseptic oral rinse  15 mL Mouth  Rinse q12n4p  . aspirin EC  81 mg Oral Daily  . bisoprolol  2.5 mg Oral Daily  . budesonide (PULMICORT) nebulizer solution  0.25 mg Nebulization BID  . collagenase   Topical Daily  . [START ON 05/05/2014] feeding supplement (PRO-STAT SUGAR FREE 64)  30 mL Oral TID WC  . [START ON 05/05/2014] feeding supplement (RESOURCE BREEZE)  1 Container Oral Q24H  . folic acid  1 mg Oral Daily  . furosemide  20 mg Intravenous Daily  . insulin aspart  0-15 Units Subcutaneous TID WC  . lactobacillus acidophilus  1 tablet Oral BID  . lidocaine  1 patch Transdermal Daily  . meropenem (MERREM) IV  1 g Intravenous 3 times per day  . meropenem (MERREM) IV  1 g Intravenous 3 times per day  . mirtazapine  15 mg Oral QHS  . multivitamin with minerals  1 tablet Oral Daily  . [START ON 05/05/2014] nutrition supplement (JUVEN)  1 packet Oral BID BM  . pantoprazole  40 mg Oral Q1200  . predniSONE  10 mg Oral Q breakfast  . pregabalin  100 mg Oral TID  . senna  1 tablet Oral QHS  . sodium  chloride  10-40 mL Intracatheter Q12H  . tamsulosin  0.4 mg Oral Daily  . vancomycin  1,000 mg Intravenous Q8H  . vitamin C  250 mg Oral TID WC   Continuous Infusions: . sodium chloride 10 mL/hr at 05/04/14 0503  . heparin 1,600 Units/hr (05/04/14 0857)    Active Problems:   Sepsis   Hypotension   Encephalopathy acute   Mitral stenosis   Aortic stenosis   Atrial fibrillation   Pericardial calcification    Time spent: >30 minutes    Vassie Loll  [facility] Pager [phone]. If 7PM-7AM, please contact night-coverage at [email or web address], password [facility] 05/04/2014, 5:33 PM  LOS: 8 days

## 2014-05-04 NOTE — Progress Notes (Addendum)
ANTICOAGULATION CONSULT NOTE - Follow Up Consult  Pharmacy Consult for Heparin Indication: DVT, afib  No Known Allergies  Patient Measurements: Height: 5\' 8"  (172.7 cm) Weight: 184 lb 15.5 oz (83.9 kg) IBW/kg (Calculated) : 68.4 Heparin Dosing Weight: 84kg  Vital Signs: Temp: 97.7 F (36.5 C) (06/23 0456) Temp src: Axillary (06/23 0456) BP: 110/67 mmHg (06/23 0456) Pulse Rate: 77 (06/23 0456)  Labs:  Recent Labs  05/02/14 0310  05/02/14 2115 05/03/14 0250 05/04/14 0406  HGB 8.6*  --   --  8.7* 7.5*  HCT 28.0*  --   --  28.0* 23.8*  PLT 150  --   --  148* 188  HEPARINUNFRC 0.19*  < > 0.41 0.46 0.33  CREATININE 0.76  --   --  0.70  --   CKTOTAL  --   --   --  54  --   < > = values in this interval not displayed.  Estimated Creatinine Clearance: 89.4 ml/min (by C-G formula based on Cr of 0.7).  Medications:  Heparin @ 1550 units/hr  Assessment: 73 yo M who continues on heparin for RLE DVT and Afib. Remains therapeutic on heparin with level this morning 0.33 units/mL. CBC remains low but relatively stable- there was a slight drop in Hgb this morning. No bleeding noted.  Goal of Therapy:  Heparin level 0.3-0.7 units/ml Monitor platelets by anticoagulation protocol: Yes   Plan:  1. Increase heparin drip slightly to 1600 units/hr  2. Daily HL and CBC- next level in the morning 3. Follow for s/s bleeding 4. F/u plan for oral anticoagulation per cardiology recommendations  Lauren D. Bajbus, PharmD, BCPS Clinical Pharmacist Pager: [phone] 05/04/2014 8:53 AM   ADDENDUM Patient discussed during morning rounds. Dr. 05/06/2014 advised by ID physician to proceed with previously planned stop date of today for antibiotics. Stop date entered on orders. Pharmacy to sign off of daptomycin and meropenem consults. Please reconsult if needed.  Lauren D. Bajbus, PharmD, BCPS Clinical Pharmacist Pager: [phone] 05/04/2014 9:32 AM

## 2014-05-04 NOTE — Consult Note (Signed)
[facility]  Total days of antibiotics 8        Day 8 meropenem        Day 8 daptomycin               Reason for Consult: fever    Referring Physician: Dyann Kief  Active Problems:   Sepsis   Hypotension   Encephalopathy acute   Mitral stenosis   Aortic stenosis   Atrial fibrillation   Pericardial calcification    HPI: Jon Lozano is a 73 y.o. male with history of HTN, PAf, PVD, AS, COPD, underwent back surgery in late FEb 2015 c/b MRSA nad proteus HW infection. He was initially treated with vancomycin and cefepime, HW removal in late April, but in setting of VRE wound infection. He was treated with daptomycin and meropenem. He was readmitted in May 11, found to have kleb pneumoniae. Antibiotic plan was to continue daptomycin and meropenem til June 23rd. He was eventually transferred to acute rehab on 6/11 but then developed fever, AMS/lethargy on 6/15, thus transferred back to inpatient for evaluation. Antibiotics were not changed, but infectious work up with blood and urine cx did not show any new cause of fever. In the last 24hr, he has had high fever of 102.3, but no corresponding leukocytosis He underwent cxr that shows worsening RLL infiltrate not previously seen on 6/16 cxr. No repeat imaging has been done of his spine lately. He has both healing wounds from his back surgery as well as decub ulcer per his family report  Past Medical History  Diagnosis Date  . Hypertension   . Heart murmur   . Peripheral vascular disease   . Arthritis   . Hepatitis 1954  . Mitral stenosis   . Aortic stenosis   . Atrial fibrillation   . Pericardial calcification     Allergies: No Known Allergies  MEDICATIONS: . antiseptic oral rinse  15 mL Mouth Rinse q12n4p  . aspirin EC  81 mg Oral Daily  . bisoprolol  2.5 mg Oral Daily  . budesonide (PULMICORT) nebulizer solution  0.25 mg Nebulization BID  . collagenase   Topical Daily  . DAPTOmycin (CUBICIN)  IV  500  mg Intravenous Q24H  . folic acid  1 mg Oral Daily  . furosemide  20 mg Intravenous Daily  . insulin aspart  0-15 Units Subcutaneous TID WC  . lactobacillus acidophilus  1 tablet Oral BID  . lidocaine  1 patch Transdermal Daily  . meropenem (MERREM) IV  1 g Intravenous 3 times per day  . mirtazapine  15 mg Oral QHS  . multivitamin with minerals  1 tablet Oral Daily  . pantoprazole  40 mg Oral Q1200  . predniSONE  10 mg Oral Q breakfast  . pregabalin  100 mg Oral TID  . senna  1 tablet Oral QHS  . sodium chloride  10-40 mL Intracatheter Q12H  . tamsulosin  0.4 mg Oral Daily  . vitamin C  250 mg Oral TID WC  . zinc sulfate  220 mg Oral Daily    History  Substance Use Topics  . Smoking status: Former Smoker -- 1.00 packs/day    Types: Cigarettes  . Smokeless tobacco: Former Systems developer    Quit date: 10/24/2011  . Alcohol Use: Yes     Comment: 3 beers per week    Family History  Problem Relation Age of Onset  . Heart disease Father     Review of Systems  Unable to obtain due to solomnence    OBJECTIVE: Temp:  [97.7 F (36.5 C)-102.3 F (39.1 C)] 97.7 F (36.5 C) (06/23 0456) Pulse Rate:  [76-121] 77 (06/23 0456) Resp:  [17-20] 19 (06/23 0456) BP: (107-122)/(67-80) 110/67 mmHg (06/23 0456) SpO2:  [90 %-100 %] 99 % (06/23 0456) Weight:  [184 lb 15.5 oz (83.9 kg)] 184 lb 15.5 oz (83.9 kg) (06/23 0456)  Constitutional:  oriented to person,appears chronically ill, sleeping. No distress.  HENT:  Mouth/Throat: Oropharynx is clear and moist. No oropharyngeal exudate.  Cardiovascular: Normal rate, regular rhythm and normal heart sounds. Exam reveals no gallop and no friction rub.  No murmur heard.  Pulmonary/Chest: Effort normal and breath sounds normal. No respiratory distress. He has no wheezes. Crackles at bases Abdominal: Soft. Bowel sounds are normal. He exhibits no distension. There is no tenderness.  Lymphadenopathy:  He has no cervical adenopathy.  Skin: Skin is warm  and dry. No rash noted. echymosis to forearms, picc line c/d/i Ext: trace LE Edema    LABS: Results for orders placed during the hospital encounter of 04/26/14 (from the past 48 hour(s))  HEPARIN LEVEL (UNFRACTIONATED)     Status: Abnormal   Collection Time    05/02/14 11:08 AM      Result Value Ref Range   Heparin Unfractionated 0.28 (*) 0.30 - 0.70 IU/mL   Comment:            IF HEPARIN RESULTS ARE BELOW     EXPECTED VALUES, AND PATIENT     DOSAGE HAS BEEN CONFIRMED,     SUGGEST FOLLOW UP TESTING     OF ANTITHROMBIN III LEVELS.  GLUCOSE, CAPILLARY     Status: Abnormal   Collection Time    05/02/14 11:09 AM      Result Value Ref Range   Glucose-Capillary 128 (*) 70 - 99 mg/dL  GLUCOSE, CAPILLARY     Status: Abnormal   Collection Time    05/02/14  4:54 PM      Result Value Ref Range   Glucose-Capillary 141 (*) 70 - 99 mg/dL  GLUCOSE, CAPILLARY     Status: Abnormal   Collection Time    05/02/14  9:00 PM      Result Value Ref Range   Glucose-Capillary 174 (*) 70 - 99 mg/dL  HEPARIN LEVEL (UNFRACTIONATED)     Status: None   Collection Time    05/02/14  9:15 PM      Result Value Ref Range   Heparin Unfractionated 0.41  0.30 - 0.70 IU/mL   Comment:            IF HEPARIN RESULTS ARE BELOW     EXPECTED VALUES, AND PATIENT     DOSAGE HAS BEEN CONFIRMED,     SUGGEST FOLLOW UP TESTING     OF ANTITHROMBIN III LEVELS.  HEPARIN LEVEL (UNFRACTIONATED)     Status: None   Collection Time    05/03/14  2:50 AM      Result Value Ref Range   Heparin Unfractionated 0.46  0.30 - 0.70 IU/mL   Comment:            IF HEPARIN RESULTS ARE BELOW     EXPECTED VALUES, AND PATIENT     DOSAGE HAS BEEN CONFIRMED,     SUGGEST FOLLOW UP TESTING     OF ANTITHROMBIN III LEVELS.     Performed at [facility]  CBC     Status: Abnormal   Collection  Time    05/03/14  2:50 AM      Result Value Ref Range   WBC 8.2  4.0 - 10.5 K/uL   RBC 3.31 (*) 4.22 - 5.81 MIL/uL   Hemoglobin  8.7 (*) 13.0 - 17.0 g/dL   HCT 28.0 (*) 39.0 - 52.0 %   MCV 84.6  78.0 - 100.0 fL   MCH 26.3  26.0 - 34.0 pg   MCHC 31.1  30.0 - 36.0 g/dL   RDW 16.6 (*) 11.5 - 15.5 %   Platelets 148 (*) 150 - 400 K/uL  CK     Status: None   Collection Time    05/03/14  2:50 AM      Result Value Ref Range   Total CK 54  7 - 232 U/L  BASIC METABOLIC PANEL     Status: Abnormal   Collection Time    05/03/14  2:50 AM      Result Value Ref Range   Sodium 135 (*) 137 - 147 mEq/L   Comment: DELTA CHECK NOTED   Potassium 3.6 (*) 3.7 - 5.3 mEq/L   Chloride 95 (*) 96 - 112 mEq/L   CO2 29  19 - 32 mEq/L   Glucose, Bld 111 (*) 70 - 99 mg/dL   BUN 9  6 - 23 mg/dL   Creatinine, Ser 0.70  0.50 - 1.35 mg/dL   Calcium 8.9  8.4 - 10.5 mg/dL   GFR calc non Af Amer >90  >90 mL/min   GFR calc Af Amer >90  >90 mL/min   Comment: (NOTE)     The eGFR has been calculated using the CKD EPI equation.     This calculation has not been validated in all clinical situations.     eGFR's persistently <90 mL/min signify possible Chronic Kidney     Disease.  GLUCOSE, CAPILLARY     Status: None   Collection Time    05/03/14  6:12 AM      Result Value Ref Range   Glucose-Capillary 97  70 - 99 mg/dL   Comment 1 Notify RN     Comment 2 Documented in Chart    GLUCOSE, CAPILLARY     Status: Abnormal   Collection Time    05/03/14 11:15 AM      Result Value Ref Range   Glucose-Capillary 107 (*) 70 - 99 mg/dL   Comment 1 Notify RN    CULTURE, BLOOD (ROUTINE X 2)     Status: None   Collection Time    05/03/14  3:01 PM      Result Value Ref Range   Specimen Description BLOOD LEFT ARM     Special Requests BOTTLES DRAWN AEROBIC AND ANAEROBIC 10CC     Culture  Setup Time       Value: 05/03/2014 18:56     Performed at [facility]   Culture       Value:        BLOOD CULTURE RECEIVED NO GROWTH TO DATE CULTURE WILL BE HELD FOR 5 DAYS BEFORE ISSUING A FINAL NEGATIVE REPORT     Performed at [facility]   Report  Status PENDING    CULTURE, BLOOD (ROUTINE X 2)     Status: None   Collection Time    05/03/14  3:14 PM      Result Value Ref Range   Specimen Description BLOOD LEFT HAND     Special Requests BOTTLES DRAWN AEROBIC ONLY 3CC  Culture  Setup Time       Value: 05/03/2014 18:58     Performed at [facility]   Culture       Value:        BLOOD CULTURE RECEIVED NO GROWTH TO DATE CULTURE WILL BE HELD FOR 5 DAYS BEFORE ISSUING A FINAL NEGATIVE REPORT     Performed at [facility]   Report Status PENDING    GLUCOSE, CAPILLARY     Status: Abnormal   Collection Time    05/03/14  3:45 PM      Result Value Ref Range   Glucose-Capillary 130 (*) 70 - 99 mg/dL  GLUCOSE, CAPILLARY     Status: Abnormal   Collection Time    05/03/14  9:37 PM      Result Value Ref Range   Glucose-Capillary 113 (*) 70 - 99 mg/dL  HEPARIN LEVEL (UNFRACTIONATED)     Status: None   Collection Time    05/04/14  4:06 AM      Result Value Ref Range   Heparin Unfractionated 0.33  0.30 - 0.70 IU/mL   Comment:            IF HEPARIN RESULTS ARE BELOW     EXPECTED VALUES, AND PATIENT     DOSAGE HAS BEEN CONFIRMED,     SUGGEST FOLLOW UP TESTING     OF ANTITHROMBIN III LEVELS.  CBC     Status: Abnormal   Collection Time    05/04/14  4:06 AM      Result Value Ref Range   WBC 7.1  4.0 - 10.5 K/uL   RBC 2.82 (*) 4.22 - 5.81 MIL/uL   Hemoglobin 7.5 (*) 13.0 - 17.0 g/dL   HCT 35.5 (*) 21.7 - 47.1 %   MCV 84.4  78.0 - 100.0 fL   MCH 26.6  26.0 - 34.0 pg   MCHC 31.5  30.0 - 36.0 g/dL   RDW 59.5 (*) 39.6 - 72.8 %   Platelets 188  150 - 400 K/uL  GLUCOSE, CAPILLARY     Status: None   Collection Time    05/04/14  5:31 AM      Result Value Ref Range   Glucose-Capillary 90  70 - 99 mg/dL    MICRO: 9/79 blood cx NGTD 6/16 blood cx NGTD 6/15 urine cx NGTD IMAGING: Dg Chest Port 1 View  05/02/2014   CLINICAL DATA:  follow SOB  EXAM: PORTABLE CHEST - 1 VIEW  COMPARISON:  None.  FINDINGS: Low lung volumes.  The heart size and mediastinal contours stable. Right central venous catheter via subclavian approach tip superior vena cava. There is increased density along the periphery of the right lung base. Mild prominence of the interstitial markings is appreciated as well as mild central peribronchial cuffing. Linear areas increased density within the lung bases. Degenerative changes within the shoulders. No acute osseous abnormalities. Both lungs are clear. The visualized skeletal structures are unremarkable.  IMPRESSION: Density along the peripheral base right hemithorax differential considerations atelectasis versus infiltrate possibly loculated effusion. Surveillance evaluation recommended with PA and lateral chest radiograph.  Pulmonary vascular congestion/ edema  Atelectasis versus scarring within the lung bases.   Electronically Signed   By: Salome Holmes M.D.   On: 05/02/2014 15:41    HISTORICAL MICRO/IMAGING  Assessment/Plan: 73yo M with complicated course of infection after back surgery s/p HW removal. Has been on long term antibiotics for VRE, MRSA, proteus HW infection of spine. He is  admitted for fevers and ams, cxr usggestive of new infiltrate. - recommend to discontinue daptomycin and switch to vancomycin for now - recommend to get sputum culture - continue with meropenem for now ,await culture results to see if any new findings. Concern for MDRO pathogen since he has had numerous antibiotics and frequent hospitalization - recommend to get MRI to evaluate for residual fluid collection from his recent back surgeries  Cyn[facility] B. [place] [facility] [phone]

## 2014-05-04 NOTE — Progress Notes (Signed)
Pt temp back up to 101.9, lowest temp has been today is 99.9.  Given tylenol twice.  MD made aware.  Will continue to monitor.

## 2014-05-04 NOTE — Progress Notes (Addendum)
ANTIBIOTIC CONSULT NOTE - INITIAL  Pharmacy Consult for vancomycin and meropenem Indication: pneumonia  No Known Allergies  Patient Measurements: Height: 5\' 8"  (172.7 cm) Weight: 184 lb 15.5 oz (83.9 kg) IBW/kg (Calculated) : 68.4   Vital Signs: Temp: 97.7 F (36.5 C) (06/23 0456) Temp src: Axillary (06/23 0456) BP: 110/67 mmHg (06/23 0456) Pulse Rate: 77 (06/23 0456) Intake/Output from previous day: 06/22 0701 - 06/23 0700 In: 659.3 [P.O.:200; I.V.:149.3; IV Piggyback:310] Out: 1575 [Urine:1575] Intake/Output from this shift: Total I/O In: -  Out: 250 [Urine:250]  Labs:  Recent Labs  05/02/14 0310 05/03/14 0250 05/04/14 0406  WBC 8.4 8.2 7.1  HGB 8.6* 8.7* 7.5*  PLT 150 148* 188  CREATININE 0.76 0.70  --    Estimated Creatinine Clearance: 89.4 ml/min (by C-G formula based on Cr of 0.7). No results found for this basename: VANCOTROUGH, 05/06/14, VANCORANDOM, GENTTROUGH, GENTPEAK, GENTRANDOM, TOBRATROUGH, TOBRAPEAK, TOBRARND, AMIKACINPEAK, AMIKACINTROU, AMIKACIN,  in the last 72 hours   Microbiology: Recent Results (from the past 720 hour(s))  CULTURE, BLOOD (ROUTINE X 2)     Status: None   Collection Time    04/06/14  3:30 PM      Result Value Ref Range Status   Specimen Description BLOOD LEFT ARM   Final   Special Requests BOTTLES DRAWN AEROBIC ONLY 3CC   Final   Culture  Setup Time     Final   Value: 04/06/2014 20:05     Performed at 04/08/2014   Culture     Final   Value: NO GROWTH 5 DAYS     Performed at [facility]   Report Status 04/12/2014 FINAL   Final  CULTURE, BLOOD (ROUTINE X 2)     Status: None   Collection Time    04/06/14  3:37 PM      Result Value Ref Range Status   Specimen Description BLOOD LEFT ARM   Final   Special Requests BOTTLES DRAWN AEROBIC ONLY 3CC   Final   Culture  Setup Time     Final   Value: 04/06/2014 20:05     Performed at 04/08/2014   Culture     Final   Value: NO GROWTH 5 DAYS      Performed at [facility]   Report Status 04/12/2014 FINAL   Final  MRSA PCR SCREENING     Status: None   Collection Time    04/22/14  9:44 AM      Result Value Ref Range Status   MRSA by PCR NEGATIVE  NEGATIVE Final   Comment:            The GeneXpert MRSA Assay (FDA     approved for NASAL specimens     only), is one component of a     comprehensive MRSA colonization     surveillance program. It is not     intended to diagnose MRSA     infection nor to guide or     monitor treatment for     MRSA infections.  CULTURE, BLOOD (ROUTINE X 2)     Status: None   Collection Time    04/25/14  6:05 PM      Result Value Ref Range Status   Specimen Description BLOOD LEFT ARM   Final   Special Requests BOTTLES DRAWN AEROBIC ONLY 10CC   Final   Culture  Setup Time     Final   Value: 04/26/2014 00:46  Performed at [facility]     Final   Value: NO GROWTH 5 DAYS     Performed at [facility]   Report Status 05/02/2014 FINAL   Final  URINE CULTURE     Status: None   Collection Time    04/25/14  6:12 PM      Result Value Ref Range Status   Specimen Description URINE, CLEAN CATCH   Final   Special Requests NONE   Final   Culture  Setup Time     Final   Value: 04/25/2014 18:51     Performed at [facility] Count     Final   Value: NO GROWTH     Performed at [facility]   Culture     Final   Value: NO GROWTH     Performed at [facility]   Report Status 04/26/2014 FINAL   Final  CULTURE, BLOOD (ROUTINE X 2)     Status: None   Collection Time    04/25/14  6:12 PM      Result Value Ref Range Status   Specimen Description BLOOD LEFT HAND   Final   Special Requests BOTTLES DRAWN AEROBIC ONLY 10CC   Final   Culture  Setup Time     Final   Value: 04/26/2014 00:46     Performed at [facility]   Culture     Final   Value: NO GROWTH 5 DAYS     Performed at [facility]   Report Status 05/02/2014  FINAL   Final  URINE CULTURE     Status: None   Collection Time    04/26/14 10:30 PM      Result Value Ref Range Status   Specimen Description URINE, CLEAN CATCH   Final   Special Requests NONE   Final   Culture  Setup Time     Final   Value: 04/27/2014 05:27     Performed at [facility] Count     Final   Value: NO GROWTH     Performed at [facility]   Culture     Final   Value: NO GROWTH     Performed at [facility]   Report Status 04/28/2014 FINAL   Final  CULTURE, BLOOD (ROUTINE X 2)     Status: None   Collection Time    04/26/14 11:45 PM      Result Value Ref Range Status   Specimen Description BLOOD LEFT ARM   Final   Special Requests BOTTLES DRAWN AEROBIC ONLY 2CC   Final   Culture  Setup Time     Final   Value: 04/27/2014 04:31     Performed at [facility]   Culture     Final   Value: NO GROWTH 5 DAYS     Performed at [facility]   Report Status 05/03/2014 FINAL   Final  CULTURE, BLOOD (ROUTINE X 2)     Status: None   Collection Time    04/27/14 12:00 AM      Result Value Ref Range Status   Specimen Description BLOOD LEFT HAND   Final   Special Requests BOTTLES DRAWN AEROBIC ONLY 1CC   Final   Culture  Setup Time     Final   Value: 04/27/2014 04:29     Performed at [facility]  Final   Value: NO GROWTH 5 DAYS     Performed at [facility]   Report Status 05/03/2014 FINAL   Final  CULTURE, BLOOD (ROUTINE X 2)     Status: None   Collection Time    05/03/14  3:01 PM      Result Value Ref Range Status   Specimen Description BLOOD LEFT ARM   Final   Special Requests BOTTLES DRAWN AEROBIC AND ANAEROBIC 10CC   Final   Culture  Setup Time     Final   Value: 05/03/2014 18:56     Performed at [facility]   Culture     Final   Value:        BLOOD CULTURE RECEIVED NO GROWTH TO DATE CULTURE WILL BE HELD FOR 5 DAYS BEFORE ISSUING A FINAL NEGATIVE REPORT     Performed at [facility]   Report Status PENDING   Incomplete  CULTURE, BLOOD (ROUTINE X 2)     Status: None   Collection Time    05/03/14  3:14 PM      Result Value Ref Range Status   Specimen Description BLOOD LEFT HAND   Final   Special Requests BOTTLES DRAWN AEROBIC ONLY 3CC   Final   Culture  Setup Time     Final   Value: 05/03/2014 18:58     Performed at [facility]   Culture     Final   Value:        BLOOD CULTURE RECEIVED NO GROWTH TO DATE CULTURE WILL BE HELD FOR 5 DAYS BEFORE ISSUING A FINAL NEGATIVE REPORT     Performed at [facility]   Report Status PENDING   Incomplete    Medical History: Past Medical History  Diagnosis Date  . Hypertension   . Heart murmur   . Peripheral vascular disease   . Arthritis   . Hepatitis 1954  . Mitral stenosis   . Aortic stenosis   . Atrial fibrillation   . Pericardial calcification     Assessment: 34 YOM with COPD, CHF, and recent complicated hardware infection. Has been on daptomycin since 4/29 and meropenem since 5/15- both with stop dates in place for today. Still spiking fevers, WBC is normal. ID note is pending- have ordered vancomycin per pharmacy for PNA as there is new infiltrate on CXR and dapto does not treat PNA. SCr 0.7 with est CrCl ~28mL/min.  Goal of Therapy:  Vancomycin trough level 15-20 mcg/ml  Plan:  1. Loading dose of vancomycin with 1500mg  IV x1 2. Maintenance dose of vancomycin 1000mg  IV q8h 3. Continue meropenem 1g IV q8h- pharmacy consult re-entered as ID is now planning to continue medication rather than stop after today's doses and previously noted. 4. Follow c/s, clinical progression, fever curve, LOT, renal function, trough at [facility]  Lauren D. Bajbus, PharmD, BCPS Clinical Pharmacist Pager: [phone] 05/04/2014 11:01 AM

## 2014-05-04 NOTE — Progress Notes (Signed)
Pt Hgb 7.5 this AM. 8.6 yesterday. Pt on Heparin gtt. No bleeding noted. VSS. MD on call K Schorr notified via amion textpage. Will continue to monitor. Huel Coventry, RN

## 2014-05-04 NOTE — Progress Notes (Signed)
NUTRITION FOLLOW UP  Intervention:   Recommend liberalizing the diet Recommend discontinuing zinc supplement as pt has been receiving high does for > 10 days which can lead to copper deficiency---> poor wound healing Continue Multivitamin and Vitamin C Provide Snacks BID Provide Pro-stat TID with meals Provide Juven BID between meals Provide Magic Cup ice cream BID Provide Resource Breeze once daily  Nutrition Dx:   Increased nutrient needs related to wound healing as evidenced by estimated nutrition needs; ongoing  Goal:   Pt to meet >/= 90% of their estimated nutrition needs; unmet  Monitor:   PO & supplemental intake, weight, labs, I/O's  Assessment:   73 y.o. M who underwent back surgery 01/04/14, complicated by hardware infection with MRSA, VRE, and proteus bacteremia. He was initially on [facility], then d/c'd to SNF on 4/29. Hardware was removed and he was readmitted to [facility] on 5/11. Course further complicated by Klebsiella PNA on sputum culture. On 6/10, was transferred to CIR then 6/15 developed new fever, altered mental status, lethargy.  RD re-consulted due to poor po intake. Per nursing notes pt has been refusing meals with completion of other meals mainly 5% the past few days. Pt getting dressings changed at time of visit; RD spoke with pt's wife who reports in addition pt pt being a picky eater and on a restricted diet, he has no appetite. Wound is not healing well per wife. Pt has received Magic Cup ice cream and Pro-stat in the past and accepted well.   Height: Ht Readings from Last 1 Encounters:  04/29/14 5\' 8"  (1.727 m)    Weight Status:   Wt Readings from Last 1 Encounters:  05/04/14 184 lb 15.5 oz (83.9 kg)    Re-estimated needs:  Kcal: 1900-2100  Protein: 105-115 gm  Fluid: 1.9-2.1 L  Skin: open wound on back  Diet Order: Cardiac   Intake/Output Summary (Last 24 hours) at 05/04/14 1642 Last data filed at 05/04/14 1450  Gross per 24 hour  Intake 769.33 ml   Output   1400 ml  Net -630.67 ml    Last BM: 6/21   Labs:   Recent Labs Lab 04/28/14 0240 04/29/14 0412 04/30/14 0658 05/02/14 0310 05/03/14 0250  NA 138 144 144 145 135*  K 3.1* 4.5 4.3 3.5* 3.6*  CL 100 108 107 90* 95*  CO2 25 25 25 31 29   BUN 13 12 13 8 9   CREATININE 0.50 0.63 0.78 0.76 0.70  CALCIUM 8.3* 8.6 8.9 8.5 8.9  MG 2.3 2.6*  --   --   --   PHOS 2.0*  --   --   --   --   GLUCOSE 135* 134* 86 85 111*    CBG (last 3)   Recent Labs  05/03/14 2137 05/04/14 0531 05/04/14 1114  GLUCAP 113* 90 110*    Scheduled Meds: . antiseptic oral rinse  15 mL Mouth Rinse q12n4p  . aspirin EC  81 mg Oral Daily  . bisoprolol  2.5 mg Oral Daily  . budesonide (PULMICORT) nebulizer solution  0.25 mg Nebulization BID  . collagenase   Topical Daily  . folic acid  1 mg Oral Daily  . furosemide  20 mg Intravenous Daily  . insulin aspart  0-15 Units Subcutaneous TID WC  . lactobacillus acidophilus  1 tablet Oral BID  . lidocaine  1 patch Transdermal Daily  . meropenem (MERREM) IV  1 g Intravenous 3 times per day  . meropenem (MERREM) IV  1  g Intravenous 3 times per day  . mirtazapine  15 mg Oral QHS  . multivitamin with minerals  1 tablet Oral Daily  . pantoprazole  40 mg Oral Q1200  . predniSONE  10 mg Oral Q breakfast  . pregabalin  100 mg Oral TID  . senna  1 tablet Oral QHS  . sodium chloride  10-40 mL Intracatheter Q12H  . tamsulosin  0.4 mg Oral Daily  . vancomycin  1,000 mg Intravenous Q8H  . vitamin C  250 mg Oral TID WC  . zinc sulfate  220 mg Oral Daily    Continuous Infusions: . sodium chloride 10 mL/hr at 05/04/14 0503  . heparin 1,600 Units/hr (05/04/14 0857)    Ian Malkin RD, LDN Inpatient Clinical Dietitian Pager: [phone] After Hours Pager: [phone]

## 2014-05-04 NOTE — Progress Notes (Signed)
Rehab admissions - I received a call from Lupita Leash, social worker asking me to contact pt's wife to explain and clarify that our current recommendation is for further rehab at [facility] and not to return to CIR. I called pt's wife and explained that rehab MD is aware of pt's case and feels that he would not be able to tolerate the intensity of acute rehab in light of medical changes since being discharged from acute rehab on 04-26-14.   Please see my previous note on 04-30-14.  Pt's wife understood, further questions were answered and she was in agreement that SNF is the most appropriate DC option. She understood that Child psychotherapist will now help with this transition to SNF when appropriate.  I updated Lupita Leash by message about this phone call.   Thanks.  Juliann Mule, PT Rehabilitation Admissions Coordinator [phone]

## 2014-05-05 LAB — GLUCOSE, CAPILLARY
GLUCOSE-CAPILLARY: 107 mg/dL — AB (ref 70–99)
Glucose-Capillary: 101 mg/dL — ABNORMAL HIGH (ref 70–99)
Glucose-Capillary: 161 mg/dL — ABNORMAL HIGH (ref 70–99)
Glucose-Capillary: 115 mg/dL — ABNORMAL HIGH (ref 70–99)

## 2014-05-05 LAB — CBC
HCT: 25.8 % — ABNORMAL LOW (ref 39.0–52.0)
Hemoglobin: 8 g/dL — ABNORMAL LOW (ref 13.0–17.0)
MCH: 26.2 pg (ref 26.0–34.0)
MCHC: 31 g/dL (ref 30.0–36.0)
MCV: 84.6 fL (ref 78.0–100.0)
Platelets: 176 10*3/uL (ref 150–400)
RBC: 3.05 MIL/uL — ABNORMAL LOW (ref 4.22–5.81)
RDW: 17 % — AB (ref 11.5–15.5)
WBC: 7.8 10*3/uL (ref 4.0–10.5)

## 2014-05-05 LAB — BASIC METABOLIC PANEL
BUN: 9 mg/dL (ref 6–23)
CHLORIDE: 92 meq/L — AB (ref 96–112)
CO2: 35 mEq/L — ABNORMAL HIGH (ref 19–32)
CREATININE: 0.66 mg/dL (ref 0.50–1.35)
Calcium: 8.9 mg/dL (ref 8.4–10.5)
GFR calc Af Amer: >90 mL/min (ref >90)
GFR calc non Af Amer: >90 mL/min (ref >90)
Glucose, Bld: 133 mg/dL — ABNORMAL HIGH (ref 70–99)
Potassium: 3.4 mEq/L — ABNORMAL LOW (ref 3.7–5.3)
Sodium: 135 mEq/L — ABNORMAL LOW (ref 137–147)

## 2014-05-05 LAB — DIFFERENTIAL
Basophils Absolute: 0 10*3/uL (ref 0.0–0.1)
Basophils Relative: 0 % (ref 0–1)
EOS ABS: 0.1 10*3/uL (ref 0.0–0.7)
Eosinophils Relative: 1 % (ref 0–5)
Lymphocytes Relative: 10 % — ABNORMAL LOW (ref 12–46)
Lymphs Abs: 0.9 10*3/uL (ref 0.7–4.0)
MONOS PCT: 7 % (ref 3–12)
Monocytes Absolute: 0.6 10*3/uL (ref 0.1–1.0)
NEUTROS ABS: 6.9 10*3/uL (ref 1.7–7.7)
Neutrophils Relative %: 82 % — ABNORMAL HIGH (ref 43–77)

## 2014-05-05 LAB — HEPARIN LEVEL (UNFRACTIONATED): HEPARIN UNFRACTIONATED: 0.61 [IU]/mL (ref 0.30–0.70)

## 2014-05-05 MED ORDER — WARFARIN - PHARMACIST DOSING INPATIENT
Freq: Every day | Status: DC
Start: 2014-05-05 — End: 2014-05-09
  Administered 2014-05-08: 18:00:00

## 2014-05-05 MED ORDER — WARFARIN SODIUM 5 MG PO TABS
5.0000 mg | ORAL_TABLET | Freq: Once | ORAL | Status: AC
  Administered 2014-05-05: 5 mg via ORAL
  Filled 2014-05-05: qty 1

## 2014-05-05 NOTE — Progress Notes (Signed)
ANTICOAGULATION CONSULT NOTE - Follow Up Consult  Pharmacy Consult for Heparin + warfarin Indication: DVT, afib  No Known Allergies  Patient Measurements: Height: 5\' 8"  (172.7 cm) Weight: 186 lb 4.6 oz (84.5 kg) IBW/kg (Calculated) : 68.4 Heparin Dosing Weight: 84kg  Vital Signs: Temp: 98.5 F (36.9 C) (06/24 0355) Temp src: Oral (06/24 0355) BP: 110/60 mmHg (06/24 0558) Pulse Rate: 62 (06/24 0355)  Labs:  Recent Labs  05/03/14 0250 05/04/14 0406 05/05/14 0915 05/05/14 1205  HGB 8.7* 7.5* 8.0*  --   HCT 28.0* 23.8* 25.8*  --   PLT 148* 188 176  --   HEPARINUNFRC 0.46 0.33 0.61  --   CREATININE 0.70  --   --  0.66  CKTOTAL 54  --   --   --     Estimated Creatinine Clearance: 89.6 ml/min (by C-G formula based on Cr of 0.66).  Medications:  Heparin @ 1600 units/hr  Assessment: 73 yo M who continues on heparin for RLE DVT and Afib. Rate was increased yesterday d/t yesterday's level being on lower side of therapeutic range. Heparin level this morning remains therapeutic at 0.61 units/mL. Hgb low but stable, platelets WNL. No bleeding noted. To also start warfarin today for long term anticoagulation.   Goal of Therapy:  Heparin level 0.3-0.7 units/ml Monitor platelets by anticoagulation protocol: Yes   Plan:  1. Continue heparin drip at 1600 units/hr  2. Warfarin 5mg  po x1 tonight  3. Daily HL, INR and CBC 4. Follow for s/s bleeding  Lauren D. Bajbus, PharmD, BCPS Clinical Pharmacist Pager: [phone] 05/05/2014 1:19 PM

## 2014-05-05 NOTE — Progress Notes (Signed)
[facility] PROGRESS NOTE Interim History: 73 y.o. M who underwent back surgery 01/04/14, complicated by hardware infection with MRSA, VRE, and proteus bacteremia. He was initially on [facility], then d/c'd to SNF on 4/29. Hardware was removed and he was readmitted to [facility] on 5/11. Course further complicated by Klebsiella PNA on sputum culture. On 6/10, was transferred to CIR then 6/15 developed new fever, altered mental status, lethargy. PCCM was consulted    Assessment/Plan: Acute resp failure with hypoxia: due to [facility]. Mild diastolic heart failure contributing along with COPD: - Improved - Patient on 2L oxygen currently and with good O2 sat  - Continue antibiotics as dictated/recommended by ID; they will follow patient after termination of abx's if still spiking fevers.  - Will continue flutter valve/IS  - Continue pulmicort and PRN xopenex  - Continue low dose lasix   Fever of unclear source: - Discussed with ID (Dr. Ilsa Iha)  - Recommendation to continue current abx's therapy and to stop on 6/23 as initially recommended  - Blood cx's has been repeated on 6/22 and pending  - Urine culture done on 6/23 negative, check MRI of spine. - d/c Picc line place peripheral, send tip culture.  COPD/Emphysema: currently: - no wheezing  -per pulmonary service plan is to continue bronchial hygiene, pulmicort and PRN bronchodilators (xopenex given A. 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 - Continue heparin drip for now  - Plan is once more stable, pursuit right heart cath to better assess/ evaluate pericardial constriction  - HR has been all over the place; cardiology recommended bisoprolol and is helping adjusting the dose for better HR controlled.   RLE DVT:  - Continue heparin  drip, start coumadi. - Eventually will benefit of xarelto therapy; will follow cardiology rec's on that   Urinary retention: - most likely associated with BPH  -started on flomax approx 5 days ago  -voiding trials were successful and foley has been removed.  GERD: continue PPI   RA: continue prednisone   acute toxic encephalopathy:  - due to infection  -now resolved  -patient AAOX3.   Dysphagia/deconditioning:   -As per SPT rec's will continue nectar thick liquids and heart healthy diet.  - Patient to be discharge to SNF vs LTAC for rehabilitation once medically stable   Severe protein calorie malnutrition: will follow nutritional service rec's    Code Status: full  Family Communication: no family at bedside  Disposition Plan: SNF    Consultants:  PCCM  cardiology  Procedures: 6/10 Doppler legs >> partial acute DVT Rt common femoral vein  6/16 Echo >> mild LVH, EF 65 to 70%, grade 3 diastolic dysfx, mild AS, mild/mod MS, mod LA dilation  6/16 CT chest >> no PE, atherosclerosis, emphysema with interstitial fibrosis, small pericardial effusions, Rt base consolidation, foci of calcification in pericardium   Antibiotics: daptomycin and meropenem completed treatment 6/23) vanc and meropenm 6.24.2015  HPI/Subjective: weak  Objective: Filed Vitals:   05/04/14 2149 05/05/14 0216 05/05/14 0355 05/05/14 0558  BP: 94/54 114/79 90/52 110/60  Pulse:  90 62   Temp:  100.9 F (38.3 C) 98.5 F (36.9 C)   TempSrc:  Oral Oral   Resp:  18 18   Height:      Weight:  84.5 kg (186 lb 4.6 oz)   SpO2:  99% 97%     Intake/Output Summary (Last 24 hours) at 05/05/14 1228 Last data filed at 05/05/14 0930  Gross per 24 hour  Intake   1080 ml  Output   1735 ml  Net   -655 ml   Filed Weights   05/03/14 0513 05/04/14 0456 05/05/14 0355  Weight: 86.5 kg (190 lb 11.2 oz) 83.9 kg (184 lb 15.5 oz) 84.5 kg (186 lb 4.6 oz)    Exam:  General: in no acute distress.  HEENT: No  bruits, no goiter.  Heart: Regular rate and rhythm, without murmurs, rubs, gallops.  Lungs: Good air movement, clear Abdomen: Soft, nontender, nondistended, positive bowel sounds.     Data Reviewed: Basic Metabolic Panel:  Recent Labs Lab 04/29/14 0412 04/30/14 0658 05/02/14 0310 05/03/14 0250  NA 144 144 145 135*  K 4.5 4.3 3.5* 3.6*  CL 108 107 90* 95*  CO2 25 25 31 29   GLUCOSE 134* 86 85 111*  BUN 12 13 8 9   CREATININE 0.63 0.78 0.76 0.70  CALCIUM 8.6 8.9 8.5 8.9  MG 2.6*  --   --   --    Liver Function Tests: No results found for this basename: AST, ALT, ALKPHOS, BILITOT, PROT, ALBUMIN,  in the last 168 hours No results found for this basename: LIPASE, AMYLASE,  in the last 168 hours No results found for this basename: AMMONIA,  in the last 168 hours CBC:  Recent Labs Lab 05/01/14 0400 05/02/14 0310 05/03/14 0250 05/04/14 0406 05/05/14 0915  WBC 7.7 8.4 8.2 7.1 7.8  HGB 8.7* 8.6* 8.7* 7.5* 8.0*  HCT 28.7* 28.0* 28.0* 23.8* 25.8*  MCV 85.2 85.4 84.6 84.4 84.6  PLT 161 150 148* 188 176   Cardiac Enzymes:  Recent Labs Lab 05/03/14 0250  CKTOTAL 54   BNP (last 3 results)  Recent Labs  02/06/14 0605 02/15/14 0640 04/30/14 0953  PROBNP 2426.0* 1292.0* 1579.0*   CBG:  Recent Labs Lab 05/04/14 1114 05/04/14 1645 05/04/14 2133 05/05/14 0555 05/05/14 1138  GLUCAP 110* 120* 137* 101* 161*    Recent Results (from the past 240 hour(s))  CULTURE, BLOOD (ROUTINE X 2)     Status: None   Collection Time    04/25/14  6:05 PM      Result Value Ref Range Status   Specimen Description BLOOD LEFT ARM   Final   Special Requests BOTTLES DRAWN AEROBIC ONLY 10CC   Final   Culture  Setup Time     Final   Value: 04/26/2014 00:46     Performed at 04/27/14   Culture     Final   Value: NO GROWTH 5 DAYS     Performed at 04/28/2014   Report Status 05/02/2014 FINAL   Final  URINE CULTURE     Status: None   Collection Time    04/25/14   6:12 PM      Result Value Ref Range Status   Specimen Description URINE, CLEAN CATCH   Final   Special Requests NONE   Final   Culture  Setup Time     Final   Value: 04/25/2014 18:51     Performed at 04/27/14 Count     Final   Value: NO GROWTH     Performed at 04/27/2014   Culture     Final   Value: NO GROWTH  Performed at [facility]   Report Status 04/26/2014 FINAL   Final  CULTURE, BLOOD (ROUTINE X 2)     Status: None   Collection Time    04/25/14  6:12 PM      Result Value Ref Range Status   Specimen Description BLOOD LEFT HAND   Final   Special Requests BOTTLES DRAWN AEROBIC ONLY 10CC   Final   Culture  Setup Time     Final   Value: 04/26/2014 00:46     Performed at [facility]   Culture     Final   Value: NO GROWTH 5 DAYS     Performed at [facility]   Report Status 05/02/2014 FINAL   Final  URINE CULTURE     Status: None   Collection Time    04/26/14 10:30 PM      Result Value Ref Range Status   Specimen Description URINE, CLEAN CATCH   Final   Special Requests NONE   Final   Culture  Setup Time     Final   Value: 04/27/2014 05:27     Performed at [facility] Count     Final   Value: NO GROWTH     Performed at [facility]   Culture     Final   Value: NO GROWTH     Performed at [facility]   Report Status 04/28/2014 FINAL   Final  CULTURE, BLOOD (ROUTINE X 2)     Status: None   Collection Time    04/26/14 11:45 PM      Result Value Ref Range Status   Specimen Description BLOOD LEFT ARM   Final   Special Requests BOTTLES DRAWN AEROBIC ONLY 2CC   Final   Culture  Setup Time     Final   Value: 04/27/2014 04:31     Performed at [facility]   Culture     Final   Value: NO GROWTH 5 DAYS     Performed at [facility]   Report Status 05/03/2014 FINAL   Final  CULTURE, BLOOD (ROUTINE X 2)     Status: None   Collection Time    04/27/14 12:00 AM       Result Value Ref Range Status   Specimen Description BLOOD LEFT HAND   Final   Special Requests BOTTLES DRAWN AEROBIC ONLY 1CC   Final   Culture  Setup Time     Final   Value: 04/27/2014 04:29     Performed at [facility]   Culture     Final   Value: NO GROWTH 5 DAYS     Performed at [facility]   Report Status 05/03/2014 FINAL   Final  CULTURE, BLOOD (ROUTINE X 2)     Status: None   Collection Time    05/03/14  3:01 PM      Result Value Ref Range Status   Specimen Description BLOOD LEFT ARM   Final   Special Requests BOTTLES DRAWN AEROBIC AND ANAEROBIC 10CC   Final   Culture  Setup Time     Final   Value: 05/03/2014 18:56     Performed at [facility]   Culture     Final   Value:        BLOOD CULTURE RECEIVED NO GROWTH TO DATE CULTURE WILL BE HELD FOR 5 DAYS BEFORE ISSUING A FINAL NEGATIVE REPORT  Performed at [facility]   Report Status PENDING   Incomplete  CULTURE, BLOOD (ROUTINE X 2)     Status: None   Collection Time    05/03/14  3:14 PM      Result Value Ref Range Status   Specimen Description BLOOD LEFT HAND   Final   Special Requests BOTTLES DRAWN AEROBIC ONLY 3CC   Final   Culture  Setup Time     Final   Value: 05/03/2014 18:58     Performed at [facility]   Culture     Final   Value:        BLOOD CULTURE RECEIVED NO GROWTH TO DATE CULTURE WILL BE HELD FOR 5 DAYS BEFORE ISSUING A FINAL NEGATIVE REPORT     Performed at [facility]   Report Status PENDING   Incomplete     Studies: No results found.  Scheduled Meds: . antiseptic oral rinse  15 mL Mouth Rinse q12n4p  . aspirin EC  81 mg Oral Daily  . bisoprolol  2.5 mg Oral Daily  . budesonide (PULMICORT) nebulizer solution  0.25 mg Nebulization BID  . collagenase   Topical Daily  . feeding supplement (PRO-STAT SUGAR FREE 64)  30 mL Oral TID WC  . feeding supplement (RESOURCE BREEZE)  1 Container Oral Q24H  . folic acid  1 mg Oral Daily  . furosemide  20  mg Intravenous Daily  . insulin aspart  0-15 Units Subcutaneous TID WC  . lactobacillus acidophilus  1 tablet Oral BID  . lidocaine  1 patch Transdermal Daily  . meropenem (MERREM) IV  1 g Intravenous 3 times per day  . mirtazapine  15 mg Oral QHS  . multivitamin with minerals  1 tablet Oral Daily  . nutrition supplement (JUVEN)  1 packet Oral BID BM  . pantoprazole  40 mg Oral Q1200  . predniSONE  10 mg Oral Q breakfast  . pregabalin  100 mg Oral TID  . senna  1 tablet Oral QHS  . sodium chloride  10-40 mL Intracatheter Q12H  . tamsulosin  0.4 mg Oral Daily  . vancomycin  1,000 mg Intravenous Q8H  . vitamin C  250 mg Oral TID WC   Continuous Infusions: . sodium chloride 10 mL/hr at 05/04/14 0503  . heparin 1,600 Units/hr (05/05/14 1228)     Marinda Elk  [facility] Pager [phone]. If 8PM-8AM, please contact night-coverage at [email or web address], password [facility] 05/05/2014, 12:28 PM  LOS: 9 days      **Disclaimer: This note may have been dictated with voice recognition software. Similar sounding words can inadvertently be transcribed and this note may contain transcription errors which may not have been corrected upon publication of note.**

## 2014-05-05 NOTE — Progress Notes (Signed)
ANTICOAGULATION CONSULT NOTE - Follow Up Consult  Pharmacy Consult for Heparin Indication: DVT, afib  No Known Allergies  Patient Measurements: Height: 5\' 8"  (172.7 cm) Weight: 186 lb 4.6 oz (84.5 kg) IBW/kg (Calculated) : 68.4 Heparin Dosing Weight: 84kg  Vital Signs: Temp: 98.5 F (36.9 C) (06/24 0355) Temp src: Oral (06/24 0355) BP: 110/60 mmHg (06/24 0558) Pulse Rate: 62 (06/24 0355)  Labs:  Recent Labs  05/03/14 0250 05/04/14 0406 05/05/14 0915  HGB 8.7* 7.5* 8.0*  HCT 28.0* 23.8* 25.8*  PLT 148* 188 176  HEPARINUNFRC 0.46 0.33 0.61  CREATININE 0.70  --   --   CKTOTAL 54  --   --     Estimated Creatinine Clearance: 89.6 ml/min (by C-G formula based on Cr of 0.7).  Medications:  Heparin @ 1600 units/hr  Assessment: 73 yo M who continues on heparin for RLE DVT and Afib. Rate was increased yesterday d/t yesterday's level being on lower side of therapeutic range. Heparin level this morning remains therapeutic at 0.61 units/mL. Hgb low but stable, platelets WNL. No bleeding noted.  Goal of Therapy:  Heparin level 0.3-0.7 units/ml Monitor platelets by anticoagulation protocol: Yes   Plan:  1. Continue heparin drip at 1600 units/hr  2. Daily HL and CBC 3. Follow for s/s bleeding 4. F/u plan for oral anticoagulation per cardiology recommendations  Lauren D. Bajbus, PharmD, BCPS Clinical Pharmacist Pager: [phone] 05/05/2014 10:15 AM

## 2014-05-05 NOTE — Progress Notes (Signed)
Chaplain visited pt based on request from pt's physician at [facility] where pt had been earlier. Pt's wife was present during the visit. Pt was lying in bed entire time. He was glad for my presence and was very engaged in our conversation but speaking was sometimes a struggle. In those times he would stop, clear his throat, and start again. Pt described being baptized as a child and raised in a [employer] but having drifted away from church in his early adult years. He had however been happy for his wife and children to attend [employer]. Chaplain listened empathically as pt shared his thoughts which included some regrets. Chaplain offered assurances of God's love and care for pt. Chaplain offered a return visit, to which pt agreed. Pt requested we close the visit by praying together the Lord's Prayer, and we did so.

## 2014-05-05 NOTE — Progress Notes (Signed)
Patient Name: Jon Lozano Date of Encounter: 05/05/2014     Active Problems:   Sepsis   Hypotension   Encephalopathy acute   Mitral stenosis   Aortic stenosis   Atrial fibrillation   Pericardial calcification    SUBJECTIVE  Patient was very lethargic. He would open eyes slightly to answer questions. He denies SOB or CP.   CURRENT MEDS . antiseptic oral rinse  15 mL Mouth Rinse q12n4p  . aspirin EC  81 mg Oral Daily  . bisoprolol  2.5 mg Oral Daily  . budesonide (PULMICORT) nebulizer solution  0.25 mg Nebulization BID  . collagenase   Topical Daily  . feeding supplement (PRO-STAT SUGAR FREE 64)  30 mL Oral TID WC  . feeding supplement (RESOURCE BREEZE)  1 Container Oral Q24H  . folic acid  1 mg Oral Daily  . furosemide  20 mg Intravenous Daily  . insulin aspart  0-15 Units Subcutaneous TID WC  . lactobacillus acidophilus  1 tablet Oral BID  . lidocaine  1 patch Transdermal Daily  . meropenem (MERREM) IV  1 g Intravenous 3 times per day  . mirtazapine  15 mg Oral QHS  . multivitamin with minerals  1 tablet Oral Daily  . nutrition supplement (JUVEN)  1 packet Oral BID BM  . pantoprazole  40 mg Oral Q1200  . predniSONE  10 mg Oral Q breakfast  . pregabalin  100 mg Oral TID  . senna  1 tablet Oral QHS  . sodium chloride  10-40 mL Intracatheter Q12H  . tamsulosin  0.4 mg Oral Daily  . vancomycin  1,000 mg Intravenous Q8H  . vitamin C  250 mg Oral TID WC    OBJECTIVE  Filed Vitals:   05/04/14 2149 05/05/14 0216 05/05/14 0355 05/05/14 0558  BP: 94/54 114/79 90/52 110/60  Pulse:  90 62   Temp:  100.9 F (38.3 C) 98.5 F (36.9 C)   TempSrc:  Oral Oral   Resp:  18 18   Height:      Weight:   186 lb 4.6 oz (84.5 kg)   SpO2:  99% 97%     Intake/Output Summary (Last 24 hours) at 05/05/14 1046 Last data filed at 05/05/14 0930  Gross per 24 hour  Intake   1080 ml  Output   1735 ml  Net   -655 ml   Filed Weights   05/03/14 0513 05/04/14 0456 05/05/14 0355    Weight: 190 lb 11.2 oz (86.5 kg) 184 lb 15.5 oz (83.9 kg) 186 lb 4.6 oz (84.5 kg)    PHYSICAL EXAM  General: Pleasant, NAD. Very lethargic. Barely opens his eye to questions  Neuro: Alert and oriented X 3.  Psych: Normal affect. HEENT:  Normal.    Neck: Supple without bruits. + JVD. Lungs:  Resp regular and unlabored, Crackes at bases.  Heart: irreg irreg. +murmur Abdomen: Soft, non-tender, non-distended, BS + x 4.  Extremities: No clubbing, cyanosis. Warm LE. UE cool to touch. No LE edema  Accessory Clinical Findings  CBC  Recent Labs  05/04/14 0406 05/05/14 0915  WBC 7.1 7.8  HGB 7.5* 8.0*  HCT 23.8* 25.8*  MCV 84.4 84.6  PLT 188 176   Basic Metabolic Panel  Recent Labs  05/03/14 0250  NA 135*  K 3.6*  CL 95*  CO2 29  GLUCOSE 111*  BUN 9  CREATININE 0.70  CALCIUM 8.9   Cardiac Enzymes  Recent Labs  05/03/14 0250  CKTOTAL 54  TELE  Sinus with Prolonged PR intervals and freq PAC  Radiology/Studies  Dg Chest 1 View  04/22/2014   CLINICAL DATA:  PICC line placement  EXAM: CHEST - 1 VIEW  COMPARISON:  Portable exam 1548 hr compared to 03/22/2014  FINDINGS: RIGHT arm PICC line tip projects over SVC.  Minimal enlargement of cardiac silhouette.  Mediastinal contours and pulmonary vascularity normal.  Mild persistent LEFT basilar infiltrate.  Improving aeration at RIGHT base.  No pleural effusion or pneumothorax.  IMPRESSION: Tip of RIGHT arm PICC line projects over mid SVC.  Persistent LEFT basilar and improving RIGHT basilar pulmonary infiltrates.   Electronically Signed   By: Ulyses Southward M.D.   On: 04/22/2014 17:14   Dg Chest 2 View  04/26/2014   CLINICAL DATA:  Hypoxia.  Question CHF  EXAM: CHEST  2 VIEW  COMPARISON:  04/25/2014  FINDINGS: Right arm PICC tip in the SVC unchanged.  Diffusely increased lung markings unchanged from the prior study. Question mild CHF versus chronic lung disease. No significant effusion.  IMPRESSION: Prominent lung markings  are stable and may be due to chronic CHF or chronic lung disease and fibrosis. No superimposed edema or effusion.   Electronically Signed   By: Marlan Palau M.D.   On: 04/26/2014 17:15   Dg Chest 2 View  04/25/2014   CLINICAL DATA:  Short of breath.  Fever.  Back pain.  EXAM: CHEST  2 VIEW  COMPARISON:  04/22/2014.  FINDINGS: Right upper extremity PICC is unchanged. The cardiopericardial silhouette remains enlarged. Bilateral basilar predominant airspace disease with diffuse interstitial prominence. Compared to the prior exam, there is no interval change aside from lower lung volumes. No pleural effusion.  IMPRESSION: 1. Unchanged right upper extremity PICC with the tip in the mid SVC. 2. Lower lung volumes with interstitial and alveolar basilar opacities.   Electronically Signed   By: Andreas Newport M.D.   On: 04/25/2014 19:27   Ct Angio Chest Pe W/cm &/or Wo Cm  04/27/2014   CLINICAL DATA:  Right lower extremity deep venous thrombosis  EXAM: CT ANGIOGRAPHY CHEST WITH CONTRAST  TECHNIQUE: Multidetector CT imaging of the chest was performed using the standard protocol during bolus administration of intravenous contrast. Multiplanar CT image reconstructions and MIPs were obtained to evaluate the vascular anatomy.  CONTRAST:  8mL OMNIPAQUE IOHEXOL 350 MG/ML SOLN  COMPARISON:  Chest CT February 04, 2014 and chest radiograph April 27, 2014  FINDINGS: There is no demonstrable pulmonary embolus. There is atherosclerotic change in the aorta. There is no demonstrable thoracic aortic aneurysm or dissection.  There is underlying emphysema with areas of interstitial fibrosis. There are small pleural effusions with what appears to be some interstitial edema superimposed on interstitial fibrotic change. There is an area of opacity in the posterior segment right upper lobe which appears to represent rounded atelectasis. There may be some superimposed pneumonia in this area. There is an area of airspace consolidation in the  posterior right base which appears new from the prior study and is felt to represent localized pneumonia in this area.  There is a small pericardial effusion. There are scattered foci of calcification within the pericardium. This finding is stable. The pericardium is also thickened on the left side with relative straightening of the left lateral heart border. There is the aortic valve and mitral annular calcification as well, stable from prior study.  There is no appreciable thoracic adenopathy. A borderline prominent superior right paratracheal lymph node is stable and  most likely of reactive etiology.  The visualized upper abdominal structures appear normal except for atherosclerotic change. There are no blastic or lytic bone lesions. Thyroid appears normal.  Review of the MIP images confirms the above findings.  IMPRESSION: No demonstrable pulmonary embolus.  Evidence of congestive heart failure superimposed on interstitial fibrosis. Note that there is a small pericardial effusion as well as scattered areas of pericardial calcification on the left. There is also extensive aortic valve and mitral annulus calcification, stable. 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 There appears to be some straightening of the lateral left heart border near the pericardium, a finding that could be indicative of constrictive pericarditis. Echocardiography could be helpful to further assess in this regard. Note that this appearance is essentially unchanged from prior CT examination.  Suspect rounded atelectasis in the posterior segment right upper lobe. There may be some superimposed infiltrate in this area. This appearance suggesting rounded atelectasis was also present on the previous study and is stable. There is also an area of localized pneumonia in the right base posteriorly.   Electronically Signed   By: Bretta Bang M.D.   On: 04/27/2014 10:28   Dg Chest Port 1  View  05/02/2014   CLINICAL DATA:  follow SOB  EXAM: PORTABLE CHEST - 1 VIEW  COMPARISON:  None.  FINDINGS: Low lung volumes. The heart size and mediastinal contours stable. Right central venous catheter via subclavian approach tip superior vena cava. There is increased density along the periphery of the right lung base. Mild prominence of the interstitial markings is appreciated as well as mild central peribronchial cuffing. Linear areas increased density within the lung bases. Degenerative changes within the shoulders. No acute osseous abnormalities. Both lungs are clear. The visualized skeletal structures are unremarkable.  IMPRESSION: Density along the peripheral base right hemithorax differential considerations atelectasis versus infiltrate possibly loculated effusion. Surveillance evaluation recommended with PA and lateral chest radiograph.  Pulmonary vascular congestion/ edema  Atelectasis versus scarring within the lung bases.   Electronically Signed   By: Salome Holmes M.D.   On: 05/02/2014 15:41   Dg Chest Port 1 View  04/29/2014   CLINICAL DATA:  Followup pneumonia  EXAM: PORTABLE CHEST - 1 VIEW  COMPARISON:  04/27/2014  FINDINGS: Mild left lower lobe atelectasis/ infiltrate unchanged. Right lung remains clear. Negative for heart failure or effusion. Right arm PICC tip in the SVC.  IMPRESSION: No significant change left lower lobe atelectasis/infiltrate   Electronically Signed   By: Marlan Palau M.D.   On: 04/29/2014 08:04   Dg Chest Port 1 View  04/27/2014   CLINICAL DATA:  Followup prominent lung markings  EXAM: PORTABLE CHEST - 1 VIEW  COMPARISON:  Portable exam 0620 hr compared to 04/26/2014  FINDINGS: RIGHT arm PICC line tip projects over SVC.  Rotated to the RIGHT.  Enlargement of cardiac silhouette.  Mediastinal contours and pulmonary vascularity normal.  Persistent accentuation of interstitial markings similar to previous exam.  Minimal infiltrate or atelectasis at LEFT base.  Lungs  otherwise clear.  No pleural effusion or pneumothorax.  Chronic RIGHT rotator cuff tear.  IMPRESSION: Mild enlargement of cardiac silhouette.  Chronic accentuation of interstitial markings.  Minimal atelectasis versus infiltrate at LEFT base.   Electronically Signed   By: Ulyses Southward M.D.   On: 04/27/2014 08:14    ASSESSMENT AND PLAN ALARIC GLADWIN is a 73 y.o. 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    Atrial fibrillation: not currently in atrial fibrillation. He is in sinus with 1st degree AV block -- Was tachycardic yesterday. Rate better controlled with addition of bisoprolol (patient with COPD but not actively wheezing)  -- Repeat ECG shows Sinus with 1st deg block.   R LE DVT - continue heparin -- He has pulmonary hypertension and hx of DVT. CT of the lungs reveals emphasemia with interstitial fibrosis. Scattered calcification of the pericardium.   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   I spoke to his wife,  He had a hx of pericarditis with pericardial effusion many years ago.  There is no pericardial effusion now. I suspect that these calcifications are old.   Acute on chronic diastolic CHF: he does not have any signs or symptoms of CHf - able to lie flat -- Currently on IV Lasix 20mg . Repeat BMET pending today.  -- Weights all over the board. Down 7lbs from admission. Net positive 3.4L. These are likely inaccurate.  HTN- blood pressure improved, previously hypotensive.   -- Patient off pressors since 6/18. His BP is stable with the addition of bisoprolol  Anemia: Hbg stable. Per primary team.   COPD/Emphysema: currently no wheezing  -- Per pulmonary service plan is to  continue bronchial hygiene, pulmicort and PRN bronchodilators (xopenex given A. Fib)   RA: continue prednisone  Dysphagia/deconditioning: as per SPT rec's will continue nectar thick liquids and heart healthy diet.   Dispo- patient to be discharge to SNF vs LTAC for rehabilitation once medically stable   Urban Gibson PA-C  Pager [phone]   Attending Note:   The patient was seen and examined.  Agree with assessment and plan as noted above.  Changes made to the above note as needed.  Mr. Tamminga has numerous issues.  We were consulted to evaluate the possibility of constrictive pericarditis.    He has no symptoms of right heart failure.  He is able to lie flat in bed with dyspnea. The calcifications seen on CT scan are likely old - he had pericarditis in the past.  At this point, there is not much more for [place] to add. He is febrile.  He is so weak that he cannot even sit up in bed.   He will need to be much stronger and have more symptoms of cardiac disease before I would want to subject him to an invasive procedure like cath.  At this point, he seems to have many other pressing issues that are limiting him.  Will sign off.  Call for questions.    Vesta Mixer, Montez Hageman., MD, [facility] 05/05/2014, 12:15 PM

## 2014-05-06 LAB — CBC
HCT: 23.5 % — ABNORMAL LOW (ref 39.0–52.0)
HEMOGLOBIN: 7.3 g/dL — AB (ref 13.0–17.0)
MCH: 26.4 pg (ref 26.0–34.0)
MCHC: 31.1 g/dL (ref 30.0–36.0)
MCV: 84.8 fL (ref 78.0–100.0)
PLATELETS: 170 10*3/uL (ref 150–400)
RBC: 2.77 MIL/uL — AB (ref 4.22–5.81)
RDW: 17 % — ABNORMAL HIGH (ref 11.5–15.5)
WBC: 6.5 10*3/uL (ref 4.0–10.5)

## 2014-05-06 LAB — GLUCOSE, CAPILLARY
GLUCOSE-CAPILLARY: 121 mg/dL — AB (ref 70–99)
GLUCOSE-CAPILLARY: 161 mg/dL — AB (ref 70–99)
Glucose-Capillary: 102 mg/dL — ABNORMAL HIGH (ref 70–99)
Glucose-Capillary: 163 mg/dL — ABNORMAL HIGH (ref 70–99)

## 2014-05-06 LAB — BASIC METABOLIC PANEL
BUN: 7 mg/dL (ref 6–23)
CHLORIDE: 91 meq/L — AB (ref 96–112)
CO2: 32 meq/L (ref 19–32)
CREATININE: 0.52 mg/dL (ref 0.50–1.35)
Calcium: 8.9 mg/dL (ref 8.4–10.5)
GFR calc Af Amer: >90 mL/min (ref >90)
GFR calc non Af Amer: >90 mL/min (ref >90)
GLUCOSE: 83 mg/dL (ref 70–99)
Potassium: 3.5 mEq/L — ABNORMAL LOW (ref 3.7–5.3)
SODIUM: 133 meq/L — AB (ref 137–147)

## 2014-05-06 LAB — VANCOMYCIN, TROUGH: Vancomycin Tr: 29.4 ug/mL (ref 10.0–20.0)

## 2014-05-06 LAB — PROTIME-INR
INR: 1.35 (ref 0.00–1.49)
Prothrombin Time: 16.7 seconds — ABNORMAL HIGH (ref 11.6–15.2)

## 2014-05-06 LAB — URINE CULTURE
Colony Count: NO GROWTH
Culture: NO GROWTH

## 2014-05-06 LAB — HEPARIN LEVEL (UNFRACTIONATED): Heparin Unfractionated: 0.44 IU/mL (ref 0.30–0.70)

## 2014-05-06 MED ORDER — FUROSEMIDE 20 MG PO TABS
20.0000 mg | ORAL_TABLET | Freq: Two times a day (BID) | ORAL | Status: DC
  Administered 2014-05-08 (×2): 20 mg via ORAL
  Filled 2014-05-06 (×5): qty 1

## 2014-05-06 MED ORDER — VANCOMYCIN HCL IN DEXTROSE 750-5 MG/150ML-% IV SOLN
750.0000 mg | Freq: Two times a day (BID) | INTRAVENOUS | Status: DC
  Administered 2014-05-06 – 2014-05-08 (×4): 750 mg via INTRAVENOUS
  Filled 2014-05-06 (×5): qty 150

## 2014-05-06 MED ORDER — WARFARIN SODIUM 5 MG PO TABS
5.0000 mg | ORAL_TABLET | Freq: Once | ORAL | Status: AC
  Administered 2014-05-06: 5 mg via ORAL
  Filled 2014-05-06: qty 1

## 2014-05-06 NOTE — Progress Notes (Signed)
CSW notified this morning by [facility] RN that patient will require a wound vac at d/c.  Per MD- patient is not medically stable for d.c.  Message left for Christy at [facility] to update on above to see if patient needs can be met by facility.  Lorie Phenix. [place], [place]

## 2014-05-06 NOTE — Progress Notes (Signed)
[facility] PROGRESS NOTE Interim History: 73 y.o. M who underwent back surgery 01/04/14, complicated by hardware infection with MRSA, VRE, and proteus bacteremia. He was initially on [facility], then d/c'd to SNF on 4/29. Hardware was removed and he was readmitted to [facility] on 5/11. Course further complicated by Klebsiella PNA on sputum culture. On 6/10, was transferred to CIR then 6/15 developed new fever, altered mental status, lethargy. PCCM was consulted    Assessment/Plan: Acute resp failure with hypoxia: due to [facility]. Mild diastolic heart failure contributing along with COPD: - Continue antibiotics as dictated/recommended by ID; they will follow patient after termination of abx's if still spiking fevers.  - Will continue flutter valve/IS  - Continue pulmicort and PRN xopenex  - Continue low dose lasix   Fever of unclear source: - Discussed with ID (Dr. Ilsa Iha)  - Recommendation to continue current abx's therapy. - Blood cx's has been repeated on 6/22 and have remained negative. - Urine culture done on 6/23 negative, check MRI of spine pending - d/c Picc line and no further fevers,place peripheral and send tip culture.  COPD/Emphysema: currently: - No wheezing  - Per pulmonary service plan is to continue bronchial hygiene, pulmicort and PRN bronchodilators (xopenex given A. 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 - Continue heparin drip and coumadin.  - Cardiology consulted and recommended bisoprolol and HR stable.  RLE DVT:  - Continue heparin drip, start coumadin. - Eventually will benefit of xarelto therapy; will follow cardiology rec's on that   Urinary retention: - most likely associated with BPH  -started on flomax approx 5 days ago  - voiding trials were  successful and foley has been removed.  GERD: continue PPI   RA: continue prednisone   acute toxic encephalopathy:  - due to infection  -now resolved  -patient AAOX3.   Dysphagia/deconditioning:   -As per SPT rec's will continue nectar thick liquids and heart healthy diet.  - Patient to be discharge to SNF vs LTAC for rehabilitation once medically stable   Severe protein calorie malnutrition: will follow nutritional service rec's    Code Status: full  Family Communication: no family at bedside  Disposition Plan: SNF    Consultants:  PCCM  cardiology  Procedures: - 6/10 Doppler legs >> partial acute DVT Rt common femoral vein  - 6/16 Echo >> mild LVH, EF 65 to 70%, grade 3 diastolic dysfx, mild AS, mild/mod MS, mod LA dilation  - 6/16 CT chest >> no PE, atherosclerosis, emphysema with interstitial fibrosis, small pericardial effusions, Rt base consolidation, foci of calcification in pericardium  CULTURES:  - Blood 4/1 >>> neg  - Blood 5/26 >>> neg  - Blood ? Date >>> POS MRSA, VRE, PROTEUS.  - Sputum ? Date >>> POS KLEBSIELLA.  - Blood 6/15 >>>  - Urine 6/15 >>>   ANTIBIOTICS:  Cefepime ? >>> 5/15  Daptomycin >>> completed treatment 6/23 per ID)   Antibiotics: daptomycin and meropenem completed treatment 6/23) vanc and meropenm 6.24.2015  HPI/Subjective: Weak, no further complains  Objective: Filed Vitals:   05/05/14 1857 05/05/14 2031 05/05/14 2054 05/06/14 0538  BP: 98/60 100/67  100/56  Pulse:  80  79  Temp:  98.8 F (37.1 C)  98.4 F (36.9 C)  TempSrc:  Oral  Oral  Resp:  18  18  Height:      Weight:    83.7 kg (184 lb 8.4 oz)  SpO2:  100% 100% 100%    Intake/Output Summary (Last 24 hours) at 05/06/14 1000 Last data filed at 05/06/14 0926  Gross per 24 hour  Intake 2577.03 ml  Output   2400 ml  Net 177.03 ml   Filed Weights   05/04/14 0456 05/05/14 0355 05/06/14 0538  Weight: 83.9 kg (184 lb 15.5 oz) 84.5 kg (186 lb 4.6 oz) 83.7 kg (184  lb 8.4 oz)    Exam:  General: in no acute distress.  HEENT: No bruits, no goiter.  Heart: Regular rate and rhythm, without murmurs, rubs, gallops.  Lungs: Good air movement, clear Abdomen: Soft, nontender, nondistended, positive bowel sounds.     Data Reviewed: Basic Metabolic Panel:  Recent Labs Lab 04/30/14 0658 05/02/14 0310 05/03/14 0250 05/05/14 1205 05/06/14 0355  NA 144 145 135* 135* 133*  K 4.3 3.5* 3.6* 3.4* 3.5*  CL 107 90* 95* 92* 91*  CO2 25 31 29  35* 32  GLUCOSE 86 85 111* 133* 83  BUN 13 8 9 9 7   CREATININE 0.78 0.76 0.70 0.66 0.52  CALCIUM 8.9 8.5 8.9 8.9 8.9   Liver Function Tests: No results found for this basename: AST, ALT, ALKPHOS, BILITOT, PROT, ALBUMIN,  in the last 168 hours No results found for this basename: LIPASE, AMYLASE,  in the last 168 hours No results found for this basename: AMMONIA,  in the last 168 hours CBC:  Recent Labs Lab 05/02/14 0310 05/03/14 0250 05/04/14 0406 05/05/14 0915 05/05/14 1205 05/06/14 0355  WBC 8.4 8.2 7.1 7.8  --  6.5  NEUTROABS  --   --   --   --  6.9  --   HGB 8.6* 8.7* 7.5* 8.0*  --  7.3*  HCT 28.0* 28.0* 23.8* 25.8*  --  23.5*  MCV 85.4 84.6 84.4 84.6  --  84.8  PLT 150 148* 188 176  --  170   Cardiac Enzymes:  Recent Labs Lab 05/03/14 0250  CKTOTAL 54   BNP (last 3 results)  Recent Labs  02/06/14 0605 02/15/14 0640 04/30/14 0953  PROBNP 2426.0* 1292.0* 1579.0*   CBG:  Recent Labs Lab 05/05/14 0555 05/05/14 1138 05/05/14 1712 05/05/14 2026 05/06/14 0829  GLUCAP 101* 161* 115* 107* 121*    Recent Results (from the past 240 hour(s))  URINE CULTURE     Status: None   Collection Time    04/26/14 10:30 PM      Result Value Ref Range Status   Specimen Description URINE, CLEAN CATCH   Final   Special Requests NONE   Final   Culture  Setup Time     Final   Value: 04/27/2014 05:27     Performed at 04/28/14 Count     Final   Value: NO GROWTH     Performed  at 04/29/2014   Culture     Final   Value: NO GROWTH     Performed at [facility]   Report Status 04/28/2014 FINAL   Final  CULTURE, BLOOD (ROUTINE X 2)     Status: None   Collection Time    04/26/14 11:45 PM      Result Value Ref Range Status   Specimen Description BLOOD LEFT ARM   Final   Special Requests BOTTLES DRAWN AEROBIC ONLY 2CC  Final   Culture  Setup Time     Final   Value: 04/27/2014 04:31     Performed at [facility]   Culture     Final   Value: NO GROWTH 5 DAYS     Performed at [facility]   Report Status 05/03/2014 FINAL   Final  CULTURE, BLOOD (ROUTINE X 2)     Status: None   Collection Time    04/27/14 12:00 AM      Result Value Ref Range Status   Specimen Description BLOOD LEFT HAND   Final   Special Requests BOTTLES DRAWN AEROBIC ONLY 1CC   Final   Culture  Setup Time     Final   Value: 04/27/2014 04:29     Performed at [facility]   Culture     Final   Value: NO GROWTH 5 DAYS     Performed at [facility]   Report Status 05/03/2014 FINAL   Final  CULTURE, BLOOD (ROUTINE X 2)     Status: None   Collection Time    05/03/14  3:01 PM      Result Value Ref Range Status   Specimen Description BLOOD LEFT ARM   Final   Special Requests BOTTLES DRAWN AEROBIC AND ANAEROBIC 10CC   Final   Culture  Setup Time     Final   Value: 05/03/2014 18:56     Performed at [facility]   Culture     Final   Value:        BLOOD CULTURE RECEIVED NO GROWTH TO DATE CULTURE WILL BE HELD FOR 5 DAYS BEFORE ISSUING A FINAL NEGATIVE REPORT     Performed at [facility]   Report Status PENDING   Incomplete  CULTURE, BLOOD (ROUTINE X 2)     Status: None   Collection Time    05/03/14  3:14 PM      Result Value Ref Range Status   Specimen Description BLOOD LEFT HAND   Final   Special Requests BOTTLES DRAWN AEROBIC ONLY 3CC   Final   Culture  Setup Time     Final   Value: 05/03/2014 18:58     Performed at [facility]   Culture     Final   Value:        BLOOD CULTURE RECEIVED NO GROWTH TO DATE CULTURE WILL BE HELD FOR 5 DAYS BEFORE ISSUING A FINAL NEGATIVE REPORT     Performed at [facility]   Report Status PENDING   Incomplete  URINE CULTURE     Status: None   Collection Time    05/04/14 10:02 PM      Result Value Ref Range Status   Specimen Description URINE, CLEAN CATCH   Final   Special Requests NONE   Final   Culture  Setup Time     Final   Value: 05/05/2014 04:27     Performed at [facility] Count     Final   Value: NO GROWTH     Performed at [facility]   Culture     Final   Value: NO GROWTH     Performed at [facility]   Report Status 05/06/2014 FINAL   Final  CATH TIP CULTURE     Status: None   Collection Time    05/05/14  2:18 PM      Result Value Ref Range Status  Specimen Description CATH TIP   Final   Special Requests NONE   Final   Culture     Final   Value: NO GROWTH 1 DAY     Performed at [facility]   Report Status PENDING   Incomplete     Studies: No results found.  Scheduled Meds: . antiseptic oral rinse  15 mL Mouth Rinse q12n4p  . aspirin EC  81 mg Oral Daily  . bisoprolol  2.5 mg Oral Daily  . budesonide (PULMICORT) nebulizer solution  0.25 mg Nebulization BID  . collagenase   Topical Daily  . feeding supplement (PRO-STAT SUGAR FREE 64)  30 mL Oral TID WC  . feeding supplement (RESOURCE BREEZE)  1 Container Oral Q24H  . folic acid  1 mg Oral Daily  . furosemide  20 mg Intravenous Daily  . insulin aspart  0-15 Units Subcutaneous TID WC  . lactobacillus acidophilus  1 tablet Oral BID  . lidocaine  1 patch Transdermal Daily  . meropenem (MERREM) IV  1 g Intravenous 3 times per day  . mirtazapine  15 mg Oral QHS  . multivitamin with minerals  1 tablet Oral Daily  . nutrition supplement (JUVEN)  1 packet Oral BID BM  . pantoprazole  40 mg Oral Q1200  . predniSONE  10 mg Oral Q breakfast  .  pregabalin  100 mg Oral TID  . senna  1 tablet Oral QHS  . sodium chloride  10-40 mL Intracatheter Q12H  . tamsulosin  0.4 mg Oral Daily  . vancomycin  1,000 mg Intravenous Q8H  . vitamin C  250 mg Oral TID WC  . warfarin  5 mg Oral ONCE-1800  . Warfarin - Pharmacist Dosing Inpatient   Does not apply q1800   Continuous Infusions: . sodium chloride 10 mL/hr at 05/04/14 0503  . heparin 1,600 Units/hr (05/05/14 1228)     Marinda Elk  [facility] Pager [phone]. If 8PM-8AM, please contact night-coverage at [email or web address], password [email or web address] 05/06/2014, 10:00 AM  LOS: 10 days      **Disclaimer: This note may have been dictated with voice recognition software. Similar sounding words can inadvertently be transcribed and this note may contain transcription errors which may not have been corrected upon publication of note.**

## 2014-05-06 NOTE — Clinical Social Work Psychosocial (Addendum)
Clinical Social Work Department BRIEF PSYCHOSOCIAL ASSESSMENT 05/04/2014  Patient:  Jon Lozano, Jon Lozano     Account Number:  [id number]     Admit date:  04/26/2014  Clinical Social Worker:  Jon Lozano  Date/Time:  05/04/2014 02:57 PM  Referred by:  Physician  Date Referred:  05/03/2014 Referred for  SNF Placement   Other Referral:   Interview type:  Other - See comment Other interview type:   [name]    PSYCHOSOCIAL DATA Living Status:  FACILITY Admitted from facility:  [facility] Level of care:   Primary support name:  Jon Lozano- [phone] Primary support relationship to patient:  SPOUSE Degree of support available:   Strong support    CURRENT CONCERNS Current Concerns  Post-Acute Placement   Other Concerns:    SOCIAL WORK ASSESSMENT / PLAN CSW met with patient and his wife Jon Lozano today. Patient was admitted to the hospital from [facility] Prior to this- he was at [facility] notified CSW that they will not accept patient back when ready for d/c as his condition is more conducive to SNF care. CSW spoke with patient and wife about SNF process and they are agreeable to same. They are requesting placement in [facility] - preferably [facility] in [place] where patient has been a prior patient. CSW will initiate bed search.   Assessment/plan status:  Psychosocial Support/Ongoing Assessment of Needs Other assessment/ plan:   Information/referral to community resources:   SNF bed list provided    PATIENT'S/FAMILY'S RESPONSE TO PLAN OF CARE: Patient is alert and oriented to person and place. He is somewhat hard of hearing and defers often to his wife for information.  Patient and wife are agreeable to placement when medically stable.  Patient has many medical issues and is quite medically complex. Not stable for d/c at this time per MD.  CSW will monitor and facilitat d/c when stable.

## 2014-05-06 NOTE — Progress Notes (Signed)
ANTIBIOTIC CONSULT NOTE - FOLLOW UP  Pharmacy Consult for vancomycin and meropenem Indication: pneumonia  No Known Allergies  Patient Measurements: Height: 5\' 8"  (172.7 cm) Weight: 184 lb 8.4 oz (83.7 kg) (bedscale) IBW/kg (Calculated) : 68.4   Vital Signs: Temp: 98 F (36.7 C) (06/25 1300) Temp src: Oral (06/25 1300) BP: 94/60 mmHg (06/25 1100) Pulse Rate: 73 (06/25 1100) Intake/Output from previous day: 06/24 0701 - 06/25 0700 In: 2717 [P.O.:378; I.V.:1139; IV Piggyback:1200] Out: 1900 [Urine:1900] Intake/Output from this shift: Total I/O In: 60 [P.O.:60] Out: 500 [Urine:500]  Labs:  Recent Labs  05/04/14 0406 05/05/14 0915 05/05/14 1205 05/06/14 0355  WBC 7.1 7.8  --  6.5  HGB 7.5* 8.0*  --  7.3*  PLT 188 176  --  170  CREATININE  --   --  0.66 0.52   Estimated Creatinine Clearance: 89.2 ml/min (by C-G formula based on Cr of 0.52).  Recent Labs  05/06/14 1205  VANCOTROUGH 29.4*     Microbiology: Recent Results (from the past 720 hour(s))  CULTURE, BLOOD (ROUTINE X 2)     Status: None   Collection Time    04/06/14  3:30 PM      Result Value Ref Range Status   Specimen Description BLOOD LEFT ARM   Final   Special Requests BOTTLES DRAWN AEROBIC ONLY 3CC   Final   Culture  Setup Time     Final   Value: 04/06/2014 20:05     Performed at 04/08/2014   Culture     Final   Value: NO GROWTH 5 DAYS     Performed at [facility]   Report Status 04/12/2014 FINAL   Final  CULTURE, BLOOD (ROUTINE X 2)     Status: None   Collection Time    04/06/14  3:37 PM      Result Value Ref Range Status   Specimen Description BLOOD LEFT ARM   Final   Special Requests BOTTLES DRAWN AEROBIC ONLY 3CC   Final   Culture  Setup Time     Final   Value: 04/06/2014 20:05     Performed at 04/08/2014   Culture     Final   Value: NO GROWTH 5 DAYS     Performed at [facility]   Report Status 04/12/2014 FINAL   Final  MRSA PCR SCREENING      Status: None   Collection Time    04/22/14  9:44 AM      Result Value Ref Range Status   MRSA by PCR NEGATIVE  NEGATIVE Final   Comment:            The GeneXpert MRSA Assay (FDA     approved for NASAL specimens     only), is one component of a     comprehensive MRSA colonization     surveillance program. It is not     intended to diagnose MRSA     infection nor to guide or     monitor treatment for     MRSA infections.  CULTURE, BLOOD (ROUTINE X 2)     Status: None   Collection Time    04/25/14  6:05 PM      Result Value Ref Range Status   Specimen Description BLOOD LEFT ARM   Final   Special Requests BOTTLES DRAWN AEROBIC ONLY 10CC   Final   Culture  Setup Time     Final   Value: 04/26/2014 00:46  Performed at [facility]   Culture     Final   Value: NO GROWTH 5 DAYS     Performed at [facility]   Report Status 05/02/2014 FINAL   Final  URINE CULTURE     Status: None   Collection Time    04/25/14  6:12 PM      Result Value Ref Range Status   Specimen Description URINE, CLEAN CATCH   Final   Special Requests NONE   Final   Culture  Setup Time     Final   Value: 04/25/2014 18:51     Performed at [facility]   Colony Count     Final   Value: NO GROWTH     Performed at [facility]   Culture     Final   Value: NO GROWTH     Performed at [facility]   Report Status 04/26/2014 FINAL   Final  CULTURE, BLOOD (ROUTINE X 2)     Status: None   Collection Time    04/25/14  6:12 PM      Result Value Ref Range Status   Specimen Description BLOOD LEFT HAND   Final   Special Requests BOTTLES DRAWN AEROBIC ONLY 10CC   Final   Culture  Setup Time     Final   Value: 04/26/2014 00:46     Performed at [facility]   Culture     Final   Value: NO GROWTH 5 DAYS     Performed at [facility]   Report Status 05/02/2014 FINAL   Final  URINE CULTURE     Status: None   Collection Time    04/26/14 10:30 PM      Result  Value Ref Range Status   Specimen Description URINE, CLEAN CATCH   Final   Special Requests NONE   Final   Culture  Setup Time     Final   Value: 04/27/2014 05:27     Performed at [facility]   Colony Count     Final   Value: NO GROWTH     Performed at [facility]   Culture     Final   Value: NO GROWTH     Performed at [facility]   Report Status 04/28/2014 FINAL   Final  CULTURE, BLOOD (ROUTINE X 2)     Status: None   Collection Time    04/26/14 11:45 PM      Result Value Ref Range Status   Specimen Description BLOOD LEFT ARM   Final   Special Requests BOTTLES DRAWN AEROBIC ONLY 2CC   Final   Culture  Setup Time     Final   Value: 04/27/2014 04:31     Performed at [facility]   Culture     Final   Value: NO GROWTH 5 DAYS     Performed at [facility]   Report Status 05/03/2014 FINAL   Final  CULTURE, BLOOD (ROUTINE X 2)     Status: None   Collection Time    04/27/14 12:00 AM      Result Value Ref Range Status   Specimen Description BLOOD LEFT HAND   Final   Special Requests BOTTLES DRAWN AEROBIC ONLY 1CC   Final   Culture  Setup Time     Final   Value: 04/27/2014 04:29     Performed at [facility]   Culture  Final   Value: NO GROWTH 5 DAYS     Performed at [facility]   Report Status 05/03/2014 FINAL   Final  CULTURE, BLOOD (ROUTINE X 2)     Status: None   Collection Time    05/03/14  3:01 PM      Result Value Ref Range Status   Specimen Description BLOOD LEFT ARM   Final   Special Requests BOTTLES DRAWN AEROBIC AND ANAEROBIC 10CC   Final   Culture  Setup Time     Final   Value: 05/03/2014 18:56     Performed at [facility]   Culture     Final   Value:        BLOOD CULTURE RECEIVED NO GROWTH TO DATE CULTURE WILL BE HELD FOR 5 DAYS BEFORE ISSUING A FINAL NEGATIVE REPORT     Performed at [facility]   Report Status PENDING   Incomplete  CULTURE, BLOOD (ROUTINE X 2)     Status: None    Collection Time    05/03/14  3:14 PM      Result Value Ref Range Status   Specimen Description BLOOD LEFT HAND   Final   Special Requests BOTTLES DRAWN AEROBIC ONLY 3CC   Final   Culture  Setup Time     Final   Value: 05/03/2014 18:58     Performed at [facility]   Culture     Final   Value:        BLOOD CULTURE RECEIVED NO GROWTH TO DATE CULTURE WILL BE HELD FOR 5 DAYS BEFORE ISSUING A FINAL NEGATIVE REPORT     Performed at [facility]   Report Status PENDING   Incomplete  URINE CULTURE     Status: None   Collection Time    05/04/14 10:02 PM      Result Value Ref Range Status   Specimen Description URINE, CLEAN CATCH   Final   Special Requests NONE   Final   Culture  Setup Time     Final   Value: 05/05/2014 04:27     Performed at [facility] Count     Final   Value: NO GROWTH     Performed at [facility]   Culture     Final   Value: NO GROWTH     Performed at [facility]   Report Status 05/06/2014 FINAL   Final  CATH TIP CULTURE     Status: None   Collection Time    05/05/14  2:18 PM      Result Value Ref Range Status   Specimen Description CATH TIP   Final   Special Requests NONE   Final   Culture     Final   Value: NO GROWTH 1 DAY     Performed at [facility]   Report Status PENDING   Incomplete    Anti-infectives   Start     Dose/Rate Route Frequency Ordered Stop   05/04/14 2000  vancomycin (VANCOCIN) IVPB 1000 mg/200 mL premix  Status:  Discontinued     1,000 mg 200 mL/hr over 60 Minutes Intravenous Every 8 hours 05/04/14 1102 05/06/14 1326   05/04/14 1400  meropenem (MERREM) 1 g in sodium chloride 0.9 % 100 mL IVPB     1 g 200 mL/hr over 30 Minutes Intravenous 3 times per day 05/04/14 1216     05/04/14 1130  vancomycin (VANCOCIN) 1,250  mg in sodium chloride 0.9 % 250 mL IVPB     1,250 mg 166.7 mL/hr over 90 Minutes Intravenous  Once 05/04/14 1102 05/04/14 1300   04/27/14 1200  DAPTOmycin (CUBICIN)  500 mg in sodium chloride 0.9 % IVPB  Status:  Discontinued     500 mg 220 mL/hr over 30 Minutes Intravenous Every 24 hours 04/26/14 2227 05/04/14 1053   04/26/14 2230  meropenem (MERREM) 1 g in sodium chloride 0.9 % 100 mL IVPB     1 g 200 mL/hr over 30 Minutes Intravenous 3 times per day 04/26/14 2227 05/04/14 2359      Assessment: 72 YOM with complicated infectious history. Has been on meropenem and daptomycin for long term treatment of hardware infection, has now been switched to vancomycin and meropenem for concern of PNA while still spiking fevers. Also has had PICC removed and cath tip was sent for culture-currently no growth. Blood cultures have been resent today, 6/22 blood cultures remain no growth to date. Has been afebrile and WBC are WNL. His renal function has remained stable with CrCl ~60mL/min, however a vancomycin trough drawn today was elevated at 29.15mcg/mL. Trough was drawn appropriately and next dose due was not hung.  Goal of Therapy:  Vancomycin trough level 15-20 mcg/ml  Plan:  1. Based on patient's kinetics, estimate new maintenance dose to be 750mg  IV q12h starting at 1700 tonight  2. Continue meropenem 1g IV q8h 3. Follow c/s, clinical progression, ID recommendations 4. Follow renal function and will obtain a trough at new SS  Lauren D. Bajbus, PharmD, BCPS Clinical Pharmacist Pager: [phone] 05/06/2014 1:53 PM

## 2014-05-06 NOTE — Progress Notes (Signed)
ANTICOAGULATION CONSULT NOTE - Follow Up Consult  Pharmacy Consult for Heparin + warfarin Indication: DVT, afib  No Known Allergies  Patient Measurements: Height: 5\' 8"  (172.7 cm) Weight: 184 lb 8.4 oz (83.7 kg) (bedscale) IBW/kg (Calculated) : 68.4 Heparin Dosing Weight: 84kg  Vital Signs: Temp: 98.4 F (36.9 C) (06/25 0538) Temp src: Oral (06/25 0538) BP: 100/56 mmHg (06/25 0538) Pulse Rate: 79 (06/25 0538)  Labs:  Recent Labs  05/04/14 0406 05/05/14 0915 05/05/14 1205 05/06/14 0355  HGB 7.5* 8.0*  --  7.3*  HCT 23.8* 25.8*  --  23.5*  PLT 188 176  --  170  LABPROT  --   --   --  16.7*  INR  --   --   --  1.35  HEPARINUNFRC 0.33 0.61  --  0.44  CREATININE  --   --  0.66 0.52    Estimated Creatinine Clearance: 89.2 ml/min (by C-G formula based on Cr of 0.52).  Medications:  Heparin @ 1600 units/hr  Assessment: 73 yo M on D#2 of heparin + warfarin overlap for RLE DVT and Afib. He needs at least 5 days of overlap therapy and INR to be therapeutic for 24 hours before heparin can be stopped. Heparin level this morning remains therapeutic at 0.44 units/mL. INR this morning is 1.35. Hgb low but stable, platelets WNL. No bleeding noted.  Goal of Therapy:  Heparin level 0.3-0.7 units/ml Monitor platelets by anticoagulation protocol: Yes   Plan:  1. Continue heparin drip at 1600 units/hr  2. Warfarin 5mg  po x1 tonight  3. Daily HL, INR and CBC 4. Follow for s/s bleeding  Daje Stark D. Mahagony Grieb, PharmD, BCPS Clinical Pharmacist Pager: [phone] 05/06/2014 8:34 AM

## 2014-05-06 NOTE — Progress Notes (Addendum)
[facility]    Date of Admission:  04/26/2014   Total days of antibiotics 10        Day 10 mero        Day 3 vanco           ID: Jon Lozano is a 73 y.o. male  with complicated course of infection after back surgery s/p HW removal. Has been on long term antibiotics for VRE, MRSA, proteus HW infection of spine.   Active Problems:   Sepsis   Hypotension   Encephalopathy acute   Mitral stenosis   Aortic stenosis   Atrial fibrillation   Pericardial calcification    Subjective: Afebrile x 24hr ,since picc line removal. Evaluated by WOC team:  Back remains with 3 full thickness wounds; 4X3X1cm with tunneling to 8 cm at 6:00 o'clock, 1X.2X3cm below upper wound, and .1X.1X1 cm to lower back. All communicate via tunneling. Each with 1 cm undermining and bone palpable with swab. No odor. Middle and lower wounds appear red with minimal amt yellow drainage; difficult to visualize inside wound R/T narrow opening.Upper back wound with large amt yellow drainage. 80% loose slough and 20% red. Continue Santyl to upper back wound for chemical debridement of nonviable tissue and gauze packing to absorb drainage to all 3 sites. =  Leg wounds slightly improved. Left posterior calf with 1X.5cm, 90% slough, 10% red. Small amt yellow drainage. Right posterior leg 3X1cm and .5X1cm, both 50% yellow and 50% red, small amt yellow drainage, no odor. Foam dressings to protect and promote healing.   Medications:  . antiseptic oral rinse  15 mL Mouth Rinse q12n4p  . aspirin EC  81 mg Oral Daily  . bisoprolol  2.5 mg Oral Daily  . budesonide (PULMICORT) nebulizer solution  0.25 mg Nebulization BID  . collagenase   Topical Daily  . feeding supplement (PRO-STAT SUGAR FREE 64)  30 mL Oral TID WC  . feeding supplement (RESOURCE BREEZE)  1 Container Oral Q24H  . folic acid  1 mg Oral Daily  . [START ON 05/08/2014] furosemide  20 mg Oral BID  . insulin aspart  0-15 Units Subcutaneous  TID WC  . lactobacillus acidophilus  1 tablet Oral BID  . lidocaine  1 patch Transdermal Daily  . meropenem (MERREM) IV  1 g Intravenous 3 times per day  . mirtazapine  15 mg Oral QHS  . multivitamin with minerals  1 tablet Oral Daily  . nutrition supplement (JUVEN)  1 packet Oral BID BM  . pantoprazole  40 mg Oral Q1200  . predniSONE  10 mg Oral Q breakfast  . pregabalin  100 mg Oral TID  . senna  1 tablet Oral QHS  . sodium chloride  10-40 mL Intracatheter Q12H  . tamsulosin  0.4 mg Oral Daily  . vancomycin  750 mg Intravenous Q12H  . vitamin C  250 mg Oral TID WC  . warfarin  5 mg Oral ONCE-1800  . Warfarin - Pharmacist Dosing Inpatient   Does not apply q1800    Objective: Vital signs in last 24 hours: Temp:  [98 F (36.7 C)-99.6 F (37.6 C)] 98 F (36.7 C) (06/25 1300) Pulse Rate:  [73-87] 73 (06/25 1100) Resp:  [16-18] 18 (06/25 0538) BP: (77-100)/(45-67) 94/60 mmHg (06/25 1100) SpO2:  [100 %] 100 % (06/25 0538) Weight:  [184 lb 8.4 oz (83.7 kg)] 184 lb 8.4 oz (83.7 kg) (06/25 0538) Did not examine  Lab Results  Recent Labs  05/05/14 0915 05/05/14 1205 05/06/14 0355  WBC 7.8  --  6.5  HGB 8.0*  --  7.3*  HCT 25.8*  --  23.5*  NA  --  135* 133*  K  --  3.4* 3.5*  CL  --  92* 91*  CO2  --  35* 32  BUN  --  9 7  CREATININE  --  0.66 0.52   Lab Results  Component Value Date   ESRSEDRATE 59* 04/20/2014   Lab Results  Component Value Date   CRP 2.0* 04/20/2014    Microbiology: 6/22 blood cx ngtd 6/24 cath tip ngtd 6/25 blood cx pending  Studies/Results: No results found.   Assessment/Plan: 73 y.o. male  with complicated course of infection after back surgery s/p HW removal. Has been on long term antibiotics for VRE, MRSA, proteus HW infection of spine. Afebrile when we switched from daptomycin to vancomycin in addition to pulling out line  Presumed pna = treat with vancomycin and meropenem x 7 days, currently on day 3  Fevers = first 24hr appear to  be afebrile, still concern that pneumonia or picc line would explain his fevers since peripheral blood cx have been negative. Recommend to get mri of spine to assess on going infection.  Drue Second [facility] Cell: [phone] Pager: [phone]  05/06/2014, 2:17 PM

## 2014-05-06 NOTE — Progress Notes (Signed)
CSW met with patient and his wife this afternoon to discuss SNF d/c plan. CSW also spoke with patient and wife about possible LTAC evaluation.  Patient had been resistant prior to today of considering return to [facility] but now agrees to talk to his wife and "consider" this option.  Current SNF plan remains in effect but patient's multiple medical issues present challenges for SNF placement.  CSW will return and talk to patient and wife tomorrow.  It he agrees- will ask MD for LTAC referral.  Lorie Phenix. [place], [place]

## 2014-05-06 NOTE — Progress Notes (Signed)
UR complete.  Courtney Robarge RN, MSN 

## 2014-05-06 NOTE — Consult Note (Signed)
WOC wound follow-up consult note Refer to previous consult notes by [facility] team from 6/11 and 6/16. Back remains with 3 full thickness wounds; 4X3X1cm with tunneling to 8 cm at 6:00 o'clock, 1X.2X3cm below upper wound, and .1X.1X1 cm to lower back.  All communicate via tunneling.  Each with 1 cm undermining and bone palpable with swab.  No odor.  Middle and lower wounds appear red with minimal amt yellow drainage; difficult to visualize inside wound R/T narrow opening.Upper back wound with large amt yellow drainage.  80% loose slough and 20% red. Continue Santyl to upper back wound for chemical debridement of nonviable tissue and gauze packing to absorb drainage to all 3 sites.  Pt should resume follow-up with spinal surgeon after discharge.    Leg wounds slightly improved.  Left posterior calf with 1X.5cm, 90% slough, 10% red.  Small amt yellow drainage.  Right posterior leg 3X1cm and .5X1cm, both 50% yellow and 50% red, small amt yellow drainage, no odor.  Foam dressings to protect and promote healing.  Please re-consult if further assistance is needed.  Thank-you,  Cammie Mcgee MSN, RN, CWOCN, [place], CNS [phone]

## 2014-05-06 NOTE — Progress Notes (Addendum)
CSW spoke to [place] at [facility]- she stated that they would consider patient for admission when stable. Will need to provide updated information once he is stable.  Lorri Frederick. [place]  [phone]

## 2014-05-06 NOTE — Clinical Social Work Placement (Addendum)
    Clinical Social Work Department CLINICAL SOCIAL WORK PLACEMENT NOTE 05/04/2014  Patient:  Jon Lozano, Jon Lozano  Account Number:  [id number] Admit date:  04/26/2014  Clinical Social Worker:  Lupita Leash CROWDER, LCSWA  Date/time:  05/04/2014 02:45 PM  Clinical Social Work is seeking post-discharge placement for this patient at the following level of care:   SKILLED NURSING   (*CSW will update this form in Epic as items are completed)   05/04/2014  Patient/family provided with [facility] Department of Clinical Social Work's list of facilities offering this level of care within the geographic area requested by the patient (or if unable, by the patient's family).  05/04/2014  Patient/family informed of their freedom to choose among providers that offer the needed level of care, that participate in Medicare, Medicaid or managed care program needed by the patient, have an available bed and are willing to accept the patient.  05/04/2014  Patient/family informed of [facility]' ownership interest in [facility], as well as of the fact that they are under no obligation to receive care at this facility.  PASARR submitted to EDS on  PASARR number received on   FL2 transmitted to all facilities in geographic area requested by pt/family on  05/04/2014 FL2 transmitted to all facilities within larger geographic area on   Patient informed that his/her managed care company has contracts with or will negotiate with  certain facilities, including the following:   Has existing PASARR     Patient/family informed of bed offers received:  05/11/14 Patient chooses bed at [facility] Physician recommends and patient chooses bed at    [facility] on   Patient to be transferred to facility by [facility]) Patient and family notified of transfer on  Name of family member notified:    The following physician request were entered in Epic:   Additional  Comments: CSW left voicemail for pt's wife informing her pt has a bed at [facility], which is the family first choice for placement. CSW invited pt's wife to call CSW.   Maryclare Labrador, MSW, [facility] Clinical Social Worker [phone]

## 2014-05-07 ENCOUNTER — Inpatient Hospital Stay (HOSPITAL_COMMUNITY): Payer: Medicare Other

## 2014-05-07 DIAGNOSIS — M810 Age-related osteoporosis without current pathological fracture: Secondary | ICD-10-CM | POA: Diagnosis present

## 2014-05-07 DIAGNOSIS — M4640 Discitis, unspecified, site unspecified: Secondary | ICD-10-CM | POA: Diagnosis present

## 2014-05-07 LAB — BASIC METABOLIC PANEL
BUN: 14 mg/dL (ref 6–23)
CALCIUM: 9.7 mg/dL (ref 8.4–10.5)
CO2: 37 meq/L — AB (ref 19–32)
Chloride: 94 mEq/L — ABNORMAL LOW (ref 96–112)
Creatinine, Ser: 0.54 mg/dL (ref 0.50–1.35)
GFR calc Af Amer: >90 mL/min (ref >90)
Glucose, Bld: 101 mg/dL — ABNORMAL HIGH (ref 70–99)
Potassium: 3.9 mEq/L (ref 3.7–5.3)
Sodium: 138 mEq/L (ref 137–147)

## 2014-05-07 LAB — CBC
HCT: 24.9 % — ABNORMAL LOW (ref 39.0–52.0)
Hemoglobin: 7.6 g/dL — ABNORMAL LOW (ref 13.0–17.0)
MCH: 26.4 pg (ref 26.0–34.0)
MCHC: 30.5 g/dL (ref 30.0–36.0)
MCV: 86.5 fL (ref 78.0–100.0)
Platelets: 171 10*3/uL (ref 150–400)
RBC: 2.88 MIL/uL — ABNORMAL LOW (ref 4.22–5.81)
RDW: 17.1 % — ABNORMAL HIGH (ref 11.5–15.5)
WBC: 6.8 10*3/uL (ref 4.0–10.5)

## 2014-05-07 LAB — PROTIME-INR
INR: 1.32 (ref 0.00–1.49)
Prothrombin Time: 16.4 seconds — ABNORMAL HIGH (ref 11.6–15.2)

## 2014-05-07 LAB — GLUCOSE, CAPILLARY
GLUCOSE-CAPILLARY: 145 mg/dL — AB (ref 70–99)
GLUCOSE-CAPILLARY: 119 mg/dL — AB (ref 70–99)
Glucose-Capillary: 85 mg/dL (ref 70–99)
Glucose-Capillary: 104 mg/dL — ABNORMAL HIGH (ref 70–99)

## 2014-05-07 LAB — CATH TIP CULTURE: Culture: NO GROWTH

## 2014-05-07 LAB — HEPARIN LEVEL (UNFRACTIONATED): Heparin Unfractionated: 0.59 IU/mL (ref 0.30–0.70)

## 2014-05-07 MED ORDER — COUMADIN BOOK
Freq: Once | Status: AC
  Administered 2014-05-07: 13:00:00
  Filled 2014-05-07: qty 1

## 2014-05-07 MED ORDER — ENOXAPARIN SODIUM 80 MG/0.8ML ~~LOC~~ SOLN
80.0000 mg | Freq: Two times a day (BID) | SUBCUTANEOUS | Status: DC
  Administered 2014-05-07 – 2014-05-08 (×2): 80 mg via SUBCUTANEOUS
  Filled 2014-05-07 (×6): qty 0.8

## 2014-05-07 MED ORDER — WARFARIN VIDEO: Freq: Once | Status: DC

## 2014-05-07 MED ORDER — INSULIN ASPART 100 UNIT/ML ~~LOC~~ SOLN
0.0000 [IU] | Freq: Three times a day (TID) | SUBCUTANEOUS | Status: DC
  Administered 2014-05-10 – 2014-05-12 (×2): 3 [IU] via SUBCUTANEOUS
  Administered 2014-05-12: 2 [IU] via SUBCUTANEOUS

## 2014-05-07 MED ORDER — WARFARIN SODIUM 7.5 MG PO TABS
7.5000 mg | ORAL_TABLET | Freq: Once | ORAL | Status: AC
  Administered 2014-05-07: 7.5 mg via ORAL
  Filled 2014-05-07: qty 1

## 2014-05-07 MED ORDER — GADOBENATE DIMEGLUMINE 529 MG/ML IV SOLN
15.0000 mL | Freq: Once | INTRAVENOUS | Status: AC
  Administered 2014-05-07: 15 mL via INTRAVENOUS

## 2014-05-07 NOTE — H&P (Signed)
Jon Lozano is an 73 y.o. male.    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 Hypertension   . Heart murmur   . Peripheral vascular disease   . Arthritis   . Hepatitis 1954  . Mitral stenosis   . Aortic stenosis   . Atrial fibrillation   . Pericardial calcification     Past Surgical History  Procedure Laterality Date  . Cholecystectomy  1983  . Pericardial fluid drainage  2007  . Cataract extraction  2012  . Tee without cardioversion  10/23/2012    Procedure: TRANSESOPHAGEAL ECHOCARDIOGRAM (TEE);  Surgeon: Sanda Klein, MD;  Location: [facility] ENDOSCOPY;  Service: Cardiovascular;  Laterality: N/A;  to come in1130 for work up    Family History  Problem Relation Age of Onset  . Heart disease Father    Social History:  reports that he has quit smoking. His smoking use included Cigarettes. He smoked 1.00 pack per day. He quit smokeless tobacco use about 2 years ago. He reports that he drinks alcohol. He reports that he does not use illicit drugs.  Allergies: No Known Allergies  Medications Prior to Admission  Medication Sig Dispense Refill  . Amino Acids-Protein Hydrolys (FEEDING SUPPLEMENT, PRO-STAT SUGAR FREE 64,) LIQD Take 30 mLs by mouth 3  (three) times daily with meals.      Marland Kitchen aspirin 81 MG tablet Take 81 mg by mouth daily.      Marland Kitchen DAPTOmycin in sodium chloride 0.9 % 100 mL Inject 500 mg into the vein daily.      . ferrous sulfate 325 (65 FE) MG tablet Take 325 mg by mouth 3 (three) times daily with meals.      . folic acid (FOLVITE) 1 MG tablet Take 1 mg by mouth daily.      . furosemide (LASIX) 20 MG tablet Take 20 mg by mouth 2 (two) times daily.      . Guaifenesin (MUCINEX MAXIMUM STRENGTH) 1200 MG TB12 Take 1 tablet by mouth 2 (two) times daily.      Marland Kitchen lactobacillus acidophilus (BACID) TABS tablet Take 1 tablet by mouth 2 (two) times daily.       Marland Kitchen lidocaine (LIDODERM) 5 % Place 1 patch onto the skin daily. Remove & Discard patch within 12 hours or as directed by MD      . meropenem 1 g in sodium chloride 0.9 % 100 mL Inject 1 g into the vein every 8 (eight) hours. For total 6 weeks      . mirtazapine (REMERON) 15 MG tablet Take 15 mg by mouth at bedtime.      . Multiple Vitamins-Minerals (MULTIVITAMINS THER. W/MINERALS) TABS tablet Take 1 tablet by mouth daily.      Marland Kitchen oxycodone (OXY-IR) 5 MG capsule  Take 5 mg by mouth 2 (two) times daily.      . pantoprazole (PROTONIX) 40 MG tablet Take 40 mg by mouth daily.      . polyethylene glycol (MIRALAX / GLYCOLAX) packet Take 17 g by mouth 2 (two) times daily.      . potassium chloride SA (K-DUR,KLOR-CON) 20 MEQ tablet Take 40 mEq by mouth daily.      . predniSONE (DELTASONE) 10 MG tablet Take 10 mg by mouth daily with breakfast.      . pregabalin (LYRICA) 100 MG capsule Take 100 mg by mouth 3 (three) times daily.      Marland Kitchen senna (SENOKOT) 8.6 MG TABS tablet Take 1 tablet by mouth at bedtime.      . tamsulosin (FLOMAX) 0.4 MG CAPS capsule Take 0.4 mg by mouth daily.      . vitamin C (ASCORBIC ACID) 250 MG tablet Take 250 mg by mouth 3 (three) times daily with meals.      . zinc sulfate 220 MG capsule Take 220 mg by mouth daily.      . clopidogrel (PLAVIX) 75 MG tablet Take 75 mg by  mouth daily.        Results for orders placed during the hospital encounter of 04/26/14 (from the past 48 hour(s))  GLUCOSE, CAPILLARY     Status: Abnormal   Collection Time    05/05/14  5:12 PM      Result Value Ref Range   Glucose-Capillary 115 (*) 70 - 99 mg/dL  GLUCOSE, CAPILLARY     Status: Abnormal   Collection Time    05/05/14  8:26 PM      Result Value Ref Range   Glucose-Capillary 107 (*) 70 - 99 mg/dL  HEPARIN LEVEL (UNFRACTIONATED)     Status: None   Collection Time    05/06/14  3:55 AM      Result Value Ref Range   Heparin Unfractionated 0.44  0.30 - 0.70 IU/mL   Comment:            IF HEPARIN RESULTS ARE BELOW     EXPECTED VALUES, AND PATIENT     DOSAGE HAS BEEN CONFIRMED,     SUGGEST FOLLOW UP TESTING     OF ANTITHROMBIN III LEVELS.  CBC     Status: Abnormal   Collection Time    05/06/14  3:55 AM      Result Value Ref Range   WBC 6.5  4.0 - 10.5 K/uL   RBC 2.77 (*) 4.22 - 5.81 MIL/uL   Hemoglobin 7.3 (*) 13.0 - 17.0 g/dL   HCT 23.5 (*) 39.0 - 52.0 %   MCV 84.8  78.0 - 100.0 fL   MCH 26.4  26.0 - 34.0 pg   MCHC 31.1  30.0 - 36.0 g/dL   RDW 17.0 (*) 11.5 - 15.5 %   Platelets 170  150 - 400 K/uL  BASIC METABOLIC PANEL     Status: Abnormal   Collection Time    05/06/14  3:55 AM      Result Value Ref Range   Sodium 133 (*) 137 - 147 mEq/L   Potassium 3.5 (*) 3.7 - 5.3 mEq/L   Chloride 91 (*) 96 - 112 mEq/L   CO2 32  19 - 32 mEq/L   Glucose, Bld 83  70 - 99 mg/dL   BUN 7  6 - 23 mg/dL   Creatinine, Ser 0.52  0.50 - 1.35 mg/dL   Calcium 8.9  8.4 -  10.5 mg/dL   GFR calc non Af Amer >90  >90 mL/min   GFR calc Af Amer >90  >90 mL/min   Comment: (NOTE)     The eGFR has been calculated using the CKD EPI equation.     This calculation has not been validated in all clinical situations.     eGFR's persistently <90 mL/min signify possible Chronic Kidney     Disease.  PROTIME-INR     Status: Abnormal   Collection Time    05/06/14  3:55 AM      Result Value Ref  Range   Prothrombin Time 16.7 (*) 11.6 - 15.2 seconds   INR 1.35  0.00 - 1.49  GLUCOSE, CAPILLARY     Status: Abnormal   Collection Time    05/06/14  8:29 AM      Result Value Ref Range   Glucose-Capillary 121 (*) 70 - 99 mg/dL  GLUCOSE, CAPILLARY     Status: Abnormal   Collection Time    05/06/14 11:13 AM      Result Value Ref Range   Glucose-Capillary 102 (*) 70 - 99 mg/dL  VANCOMYCIN, TROUGH     Status: Abnormal   Collection Time    05/06/14 12:05 PM      Result Value Ref Range   Vancomycin Tr 29.4 (*) 10.0 - 20.0 ug/mL   Comment: CRITICAL RESULT CALLED TO, READ BACK BY AND VERIFIED WITH:     TEASLEY,L RN @ 2330 05/06/14 LEONARD,A  CULTURE, BLOOD (ROUTINE X 2)     Status: None   Collection Time    05/06/14 12:05 PM      Result Value Ref Range   Specimen Description BLOOD RIGHT HAND     Special Requests BOTTLES DRAWN AEROBIC ONLY 6CC     Culture  Setup Time       Value: 05/06/2014 18:53     Performed at [facility]   Culture       Value:        BLOOD CULTURE RECEIVED NO GROWTH TO DATE CULTURE WILL BE HELD FOR 5 DAYS BEFORE ISSUING A FINAL NEGATIVE REPORT     Performed at [facility]   Report Status PENDING    CULTURE, BLOOD (ROUTINE X 2)     Status: None   Collection Time    05/06/14 12:15 PM      Result Value Ref Range   Specimen Description BLOOD RIGHT FOREARM     Special Requests BOTTLES DRAWN AEROBIC ONLY [place]     Culture  Setup Time       Value: 05/06/2014 18:54     Performed at [facility]   Culture       Value:        BLOOD CULTURE RECEIVED NO GROWTH TO DATE CULTURE WILL BE HELD FOR 5 DAYS BEFORE ISSUING A FINAL NEGATIVE REPORT     Performed at [facility]   Report Status PENDING    GLUCOSE, CAPILLARY     Status: Abnormal   Collection Time    05/06/14  4:19 PM      Result Value Ref Range   Glucose-Capillary 163 (*) 70 - 99 mg/dL  GLUCOSE, CAPILLARY     Status: Abnormal   Collection Time    05/06/14  8:19 PM      Result  Value Ref Range   Glucose-Capillary 161 (*) 70 - 99 mg/dL  HEPARIN LEVEL (UNFRACTIONATED)     Status: None  Collection Time    05/07/14  5:07 AM      Result Value Ref Range   Heparin Unfractionated 0.59  0.30 - 0.70 IU/mL   Comment:            IF HEPARIN RESULTS ARE BELOW     EXPECTED VALUES, AND PATIENT     DOSAGE HAS BEEN CONFIRMED,     SUGGEST FOLLOW UP TESTING     OF ANTITHROMBIN III LEVELS.  CBC     Status: Abnormal   Collection Time    05/07/14  5:07 AM      Result Value Ref Range   WBC 6.8  4.0 - 10.5 K/uL   RBC 2.88 (*) 4.22 - 5.81 MIL/uL   Hemoglobin 7.6 (*) 13.0 - 17.0 g/dL   HCT 24.9 (*) 39.0 - 52.0 %   MCV 86.5  78.0 - 100.0 fL   MCH 26.4  26.0 - 34.0 pg   MCHC 30.5  30.0 - 36.0 g/dL   RDW 17.1 (*) 11.5 - 15.5 %   Platelets 171  150 - 400 K/uL  BASIC METABOLIC PANEL     Status: Abnormal   Collection Time    05/07/14  5:07 AM      Result Value Ref Range   Sodium 138  137 - 147 mEq/L   Potassium 3.9  3.7 - 5.3 mEq/L   Chloride 94 (*) 96 - 112 mEq/L   CO2 37 (*) 19 - 32 mEq/L   Glucose, Bld 101 (*) 70 - 99 mg/dL   BUN 14  6 - 23 mg/dL   Creatinine, Ser 0.54  0.50 - 1.35 mg/dL   Calcium 9.7  8.4 - 10.5 mg/dL   GFR calc non Af Amer >90  >90 mL/min   GFR calc Af Amer >90  >90 mL/min   Comment: (NOTE)     The eGFR has been calculated using the CKD EPI equation.     This calculation has not been validated in all clinical situations.     eGFR's persistently <90 mL/min signify possible Chronic Kidney     Disease.  PROTIME-INR     Status: Abnormal   Collection Time    05/07/14  5:07 AM      Result Value Ref Range   Prothrombin Time 16.4 (*) 11.6 - 15.2 seconds   INR 1.32  0.00 - 1.49  GLUCOSE, CAPILLARY     Status: None   Collection Time    05/07/14  5:54 AM      Result Value Ref Range   Glucose-Capillary 85  70 - 99 mg/dL  GLUCOSE, CAPILLARY     Status: Abnormal   Collection Time    05/07/14 11:16 AM      Result Value Ref Range   Glucose-Capillary 104  (*) 70 - 99 mg/dL   Mr Lumbar Spine W Wo Contrast  05/07/2014   CLINICAL DATA:  Wound on the back. Infection following spinal fusion attempt.  EXAM: MRI LUMBAR SPINE WITHOUT AND WITH CONTRAST  TECHNIQUE: Multiplanar and multiecho pulse sequences of the lumbar spine were obtained without and with intravenous contrast.  CONTRAST:  79m MULTIHANCE GADOBENATE DIMEGLUMINE 529 MG/ML IV SOLN  COMPARISON:  Radiographs 03/17/2014. MRI 11/10/2013. Intraoperative fluoroscopy 01/04/2014.  FINDINGS: Segmentation: Numbering used on prior exam is preserved with 5 lumbar type vertebral bodies.  Alignment: Straightening of the normal lumbar lordosis with slight reversal centered around L2-L3. Grade I retrolisthesis of L4 on L5 measuring 4 mm.  Vertebrae: Discitis/osteomyelitis is  present at L3-L4, with endplate edema and enhancement.There is irregularity the endplates which is new compared to the prior exam from 2014 compatible osteolysis from disc space infection. The L3-L4 disc appears liquified.  Postsurgical changes are present at L4-L5 and L5-S1. No compression fracture.  Screw tracts are present in the L4 through S1 vertebrae compatible with removed rod and pedicle screw hardware.  Conus medullaris: Terminates at L1-L2, normal. Unchanged from prior.  Paraspinal tissues: Paraspinal muscular edema is most pronounced around the L3-L4 level but extends into both iliopsoas muscles. There is chronic LEFT psoas muscle atrophy. No psoas muscle abscess is identified. There is a RIGHT eccentric fluid collection dorsal to the laminectomies at L4 and L5 probably representing abscess. This measures 5.3 cm transverse by 1.8 cm AP and extends up to the posterior aspect of the thecal sac. On post gadolinium imaging, this demonstrates irregular peripheral enhancement, most suggestive of abscess. Areas of susceptibility artifact are present at the periphery. A spinal leak or seroma is considered less likely.  There are several areas of  right-sided subcutaneous ulceration with cellulitis. Wound dressing material appears present in the dorsal back. On the sagittal post gadolinium imaging, the right-sided fluid collection appears to communicate with the superficial ulcerations although this is incompletely imaged (image 3 series 9).  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  Disc levels:  L1-L2: Moderate central stenosis associated with a central disc extrusion with cranial extension. There is central thickening of the posterior longitudinal ligament that extends to the L2-L3 disc space. This probably represents an old disc extrusion anterior to the posterior longitudinal ligament. The spinal canal is congenitally narrow. No change from prior. Foramina patent.  L2-L3: Disc degeneration and desiccation with loss of height. Broad-based posterior disc bulging. Mild central stenosis. Foramina appear adequately patent.  L3-L4: Liquefaction of the disc. Discitis/ osteomyelitis. Enhancing granulation tissue extends deep to the posterior longitudinal ligament dorsal to L4 from the disc space. No discrete epidural abscess. L3 laminectomy with recurrent moderate central stenosis. Moderate RIGHT and mild LEFT foraminal stenosis. Bilateral lateral recess stenosis.  L4-L5: Laminectomy. Central canal is decompressed. Discectomy with interbody bone graft. Enhancing granulation tissue present in the epidural space and around both neural foramina. RIGHT eccentric previously mentioned posterior fluid collection extending inferiorly.  L5-S1: Discectomy and laminectomy. Central canal and lateral recesses decompressed. Moderate bilateral foraminal stenosis. Epidural granulation tissue.  IMPRESSION: 1. Subcutaneous ulceration in the dorsal soft tissues of the back eccentric to the RIGHT. Deep fluid collection compatible with abscess in the laminectomy bed at L4-L5 with tract communicating with the superficial ulcerations to the RIGHT of midline. Tract is best  visualized on sagittal image number 3 series 9. 2. Development of discitis/osteomyelitis at L3-L4 with moderate central stenosis. No definite epidural abscess. 3. L4-L5 and L5-S1 laminectomy with remove rod and pedicle screw hardware.   Electronically Signed   By: Dereck Ligas M.D.   On: 05/07/2014 11:44    Review of Systems  Constitutional: Positive for weight loss. Negative for fever.  Respiratory: Negative for shortness of breath.   Cardiovascular: Negative for chest pain.  Gastrointestinal: Positive for abdominal pain. Negative for nausea.  Musculoskeletal: Positive for back pain and neck pain.  Neurological: Positive for weakness.    Blood pressure 101/68, pulse 88, temperature 98.1 F (36.7 C), temperature source Oral, resp. rate 18, height _0  (1.727 m), weight 82.89 kg (182 lb 11.8 oz), SpO2 100.00%. Physical Exam  Constitutional: He is oriented to person, place, and  time. He appears well-nourished.  Cardiovascular: Normal rate.   Murmur heard. Respiratory: Effort normal and breath sounds normal. He has no wheezes.  Musculoskeletal: Normal range of motion.  2 open wounds in area of L4 and 5 Macerated skin; reddened Very painful for pt  Neurological: He is alert and oriented to person, place, and time.  Groggy but alert and oriented  Skin: Skin is warm and dry.  Psychiatric: He has a normal mood and affect. Thought content normal.     Assessment/Plan Back surgery 0/1222 Complicated with hardware infection Treated for MRSA/VRE/proteus bacteremia To [facility]  now with new fever MRI: L4- discitis/paraspinal abscess Now scheduled for aspiration of same Pt aware of procedure but wants to discuss with wife---she is coming to hospital now Consent in chart- ready for signature Will hold Lovenox/Hep in am Dr Estanislado Pandy states he will bring pt to IR and examine back area--if he is able to access Paraspinal abscess without going through open wounds--he will be  able to proceed with procedure.   TURPIN,PAMELA A 05/07/2014, 3:00 PM

## 2014-05-07 NOTE — Progress Notes (Signed)
1500 Pt's wife in .  Will come at about 8; am to talk with radiol;ogy PA  About the procedure

## 2014-05-07 NOTE — Progress Notes (Signed)
[facility]    Date of Admission:  04/26/2014   Total days of antibiotics 11        Day 11 mero        Day 4 vanco           ID: Jon Lozano is a 73 y.o. male  with complicated course of infection after back surgery s/p HW removal. Has been on long term antibiotics for VRE, MRSA, proteus HW infection of spine.   Active Problems:   Sepsis   Hypotension   Encephalopathy acute   Mitral stenosis   Aortic stenosis   Atrial fibrillation   Pericardial calcification    Subjective: Afebrile x 48hr ,since picc line removal. Underwent MRI of spine that shows L3-L4 discitis plus deep fluid collection in L4-L5 laminectomy bed that communicates with superficial ulcerations   Medications:  . antiseptic oral rinse  15 mL Mouth Rinse q12n4p  . aspirin EC  81 mg Oral Daily  . bisoprolol  2.5 mg Oral Daily  . budesonide (PULMICORT) nebulizer solution  0.25 mg Nebulization BID  . collagenase   Topical Daily  . coumadin book   Does not apply Once  . enoxaparin (LOVENOX) injection  80 mg Subcutaneous Q12H  . feeding supplement (PRO-STAT SUGAR FREE 64)  30 mL Oral TID WC  . feeding supplement (RESOURCE BREEZE)  1 Container Oral Q24H  . folic acid  1 mg Oral Daily  . [START ON 05/08/2014] furosemide  20 mg Oral BID  . insulin aspart  0-15 Units Subcutaneous TID WC  . lactobacillus acidophilus  1 tablet Oral BID  . lidocaine  1 patch Transdermal Daily  . meropenem (MERREM) IV  1 g Intravenous 3 times per day  . mirtazapine  15 mg Oral QHS  . multivitamin with minerals  1 tablet Oral Daily  . nutrition supplement (JUVEN)  1 packet Oral BID BM  . pantoprazole  40 mg Oral Q1200  . predniSONE  10 mg Oral Q breakfast  . pregabalin  100 mg Oral TID  . senna  1 tablet Oral QHS  . sodium chloride  10-40 mL Intracatheter Q12H  . tamsulosin  0.4 mg Oral Daily  . vancomycin  750 mg Intravenous Q12H  . vitamin C  250 mg Oral TID WC  . warfarin  7.5 mg Oral ONCE-1800    . warfarin   Does not apply Once  . Warfarin - Pharmacist Dosing Inpatient   Does not apply q1800    Objective: Vital signs in last 24 hours: Temp:  [98 F (36.7 C)-98.6 F (37 C)] 98.1 F (36.7 C) (06/26 0555) Pulse Rate:  [86-90] 88 (06/26 0555) Resp:  [18] 18 (06/26 0555) BP: (101-110)/(52-68) 101/68 mmHg (06/26 0555) SpO2:  [96 %-100 %] 100 % (06/26 0555) Weight:  [182 lb 11.8 oz (82.89 kg)] 182 lb 11.8 oz (82.89 kg) (06/26 0555) Did not examine  Lab Results  Recent Labs  05/06/14 0355 05/07/14 0507  WBC 6.5 6.8  HGB 7.3* 7.6*  HCT 23.5* 24.9*  NA 133* 138  K 3.5* 3.9  CL 91* 94*  CO2 32 37*  BUN 7 14  CREATININE 0.52 0.54   Lab Results  Component Value Date   ESRSEDRATE 59* 04/20/2014   Lab Results  Component Value Date   CRP 2.0* 04/20/2014    Microbiology: 6/22 blood cx ngtd 6/24 cath tip ngtd 6/25 blood cx pending  Studies/Results: Mr Lumbar Spine W Wo  Contrast  05/07/2014   CLINICAL DATA:  Wound on the back. Infection following spinal fusion attempt.  EXAM: MRI LUMBAR SPINE WITHOUT AND WITH CONTRAST  TECHNIQUE: Multiplanar and multiecho pulse sequences of the lumbar spine were obtained without and with intravenous contrast.  CONTRAST:  65mL MULTIHANCE GADOBENATE DIMEGLUMINE 529 MG/ML IV SOLN  COMPARISON:  Radiographs 03/17/2014. MRI 11/10/2013. Intraoperative fluoroscopy 01/04/2014.  FINDINGS: Segmentation: Numbering used on prior exam is preserved with 5 lumbar type vertebral bodies.  Alignment: Straightening of the normal lumbar lordosis with slight reversal centered around L2-L3. Grade I retrolisthesis of L4 on L5 measuring 4 mm.  Vertebrae: Discitis/osteomyelitis is present at L3-L4, with endplate edema and enhancement.There is irregularity the endplates which is new compared to the prior exam from 2014 compatible osteolysis from disc space infection. The L3-L4 disc appears liquified.  Postsurgical changes are present at L4-L5 and L5-S1. No compression  fracture.  Screw tracts are present in the L4 through S1 vertebrae compatible with removed rod and pedicle screw hardware.  Conus medullaris: Terminates at L1-L2, normal. Unchanged from prior.  Paraspinal tissues: Paraspinal muscular edema is most pronounced around the L3-L4 level but extends into both iliopsoas muscles. There is chronic LEFT psoas muscle atrophy. No psoas muscle abscess is identified. There is a RIGHT eccentric fluid collection dorsal to the laminectomies at L4 and L5 probably representing abscess. This measures 5.3 cm transverse by 1.8 cm AP and extends up to the posterior aspect of the thecal sac. On post gadolinium imaging, this demonstrates irregular peripheral enhancement, most suggestive of abscess. Areas of susceptibility artifact are present at the periphery. A spinal leak or seroma is considered less likely.  There are several areas of right-sided subcutaneous ulceration with cellulitis. Wound dressing material appears present in the dorsal back. On the sagittal post gadolinium imaging, the right-sided fluid collection appears to communicate with the superficial ulcerations although this is incompletely imaged (image 3 series 9).  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  Disc levels:  L1-L2: Moderate central stenosis associated with a central disc extrusion with cranial extension. There is central thickening of the posterior longitudinal ligament that extends to the L2-L3 disc space. This probably represents an old disc extrusion anterior to the posterior longitudinal ligament. The spinal canal is congenitally narrow. No change from prior. Foramina patent.  L2-L3: Disc degeneration and desiccation with loss of height. Broad-based posterior disc bulging. Mild central stenosis. Foramina appear adequately patent.  L3-L4: Liquefaction of the disc. Discitis/ osteomyelitis. Enhancing granulation tissue extends deep to the posterior longitudinal ligament dorsal to L4 from the disc  space. No discrete epidural abscess. L3 laminectomy with recurrent moderate central stenosis. Moderate RIGHT and mild LEFT foraminal stenosis. Bilateral lateral recess stenosis.  L4-L5: Laminectomy. Central canal is decompressed. Discectomy with interbody bone graft. Enhancing granulation tissue present in the epidural space and around both neural foramina. RIGHT eccentric previously mentioned posterior fluid collection extending inferiorly.  L5-S1: Discectomy and laminectomy. Central canal and lateral recesses decompressed. Moderate bilateral foraminal stenosis. Epidural granulation tissue.  IMPRESSION: 1. Subcutaneous ulceration in the dorsal soft tissues of the back eccentric to the RIGHT. Deep fluid collection compatible with abscess in the laminectomy bed at L4-L5 with tract communicating with the superficial ulcerations to the RIGHT of midline. Tract is best visualized on sagittal image number 3 series 9. 2. Development of discitis/osteomyelitis at L3-L4 with moderate central stenosis. No definite epidural abscess. 3. L4-L5 and L5-S1 laminectomy with remove rod and pedicle screw hardware.   Electronically  Signed   By: Andreas Newport M.D.   On: 05/07/2014 11:44     Assessment/Plan: 73 y.o. male  with complicated course of infection after back surgery s/p HW removal. Has been on long term antibiotics for VRE, MRSA, proteus HW infection of spine. Afebrile when we switched from daptomycin to vancomycin in addition to pulling out line.  Presumed pna = treat with vancomycin and meropenem x 7 days, currently on day 4  Fevers = likely due to picc line, since they abated once removal of line vs. Concurrent treatment of pneumonia.  Deep tissue infection/L4-L5 discitis = imaging suggests deep tissue fluid collection, enhancing c/w abscess, measuring 2 x 5cm. Would ask neurosurgery to see if needs further I x D vs. If amenable to IR draining. He was previously receiving antibiotics for osteomyelitis and deep  tissue infection that was to end on 6/23, but at that time the patient had fevers. Would need to track down old records to determine, what oral suppression to give since he had been on IV antibiotics for 6 + weeks  SNIDER, [facility] Infect[facility] Cell: [phone] Pager: [phone]  05/07/2014, 11:49 AM

## 2014-05-07 NOTE — Progress Notes (Addendum)
1130  MRI report received from radiology . Dr Ernestine Mcmurray aware spoken with pt including plan of care  ,IR to evaluate and manage.

## 2014-05-07 NOTE — Progress Notes (Signed)
1500 Pt refused to sign consent unless wife is present . Radiology PA unable to contact spouse . No tel . response

## 2014-05-07 NOTE — Progress Notes (Addendum)
ANTICOAGULATION CONSULT NOTE - Follow Up Consult  Pharmacy Consult for Heparin + warfarin Indication: DVT, afib  No Known Allergies  Patient Measurements: Height: 5\' 8"  (172.7 cm) Weight: 182 lb 11.8 oz (82.89 kg) IBW/kg (Calculated) : 68.4 Heparin Dosing Weight: 84kg  Vital Signs: Temp: 98.1 F (36.7 C) (06/26 0555) Temp src: Oral (06/26 0555) BP: 101/68 mmHg (06/26 0555) Pulse Rate: 88 (06/26 0555)  Labs:  Recent Labs  05/05/14 0915 05/05/14 1205 05/06/14 0355 05/07/14 0507  HGB 8.0*  --  7.3* 7.6*  HCT 25.8*  --  23.5* 24.9*  PLT 176  --  170 171  LABPROT  --   --  16.7* 16.4*  INR  --   --  1.35 1.32  HEPARINUNFRC 0.61  --  0.44 0.59  CREATININE  --  0.66 0.52 0.54    Estimated Creatinine Clearance: 88.9 ml/min (by C-G formula based on Cr of 0.54).  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 He needs at least 5 days of overlap therapy and INR to be therapeutic for 24 hours before heparin can be stopped. Heparin level this morning remains therapeutic at 0.59 units/mL. INR this morning is 1.32- relatively unchanged after two doses of 5mg . Hgb low but stable, platelets WNL. No bleeding noted.  Goal of Therapy:  Heparin level 0.3-0.7 units/ml Monitor platelets by anticoagulation protocol: Yes   Plan:  1. Continue heparin drip at 1600 units/hr  2. Warfarin 7.5mg  po x1 tonight  3. Daily HL, INR and CBC 4. Follow for s/s bleeding  Lauren D. Bajbus, PharmD, BCPS Clinical Pharmacist Pager: [phone] 05/07/2014 8:22 AM   ADDENDUM during discussion on rounds, to switch to Lovenox to finish 5 day bridge in preparation of possible discharge soon. CrCl >36mL/min.  Plan:  1. Stop heparin at 1000 and start Lovenox 1mg /kg (80mg ) subq q12h at 1100- RN aware of plan 2. Recommend patient receives warfarin 7.5mg  daily with INR check on Monday 6/29.  Lauren D. Bajbus, PharmD, BCPS Clinical  Pharmacist Pager: [phone] 05/07/2014 9:22 AM

## 2014-05-07 NOTE — Progress Notes (Addendum)
[facility] PROGRESS NOTE Interim History: 73 y.o. M who underwent back surgery 01/04/14, complicated by hardware infection with MRSA, VRE, and proteus bacteremia. He was initially on [facility], then d/c'd to SNF on 4/29. Hardware was removed and he was readmitted to [facility] on 5/11. Course further complicated by Klebsiella PNA on sputum culture. On 6/10, was transferred to CIR then 6/15 developed new fever, altered mental status, lethargy. PCCM was consulted    Assessment/Plan: Acute resp failure with hypoxia: due to [facility]. Mild diastolic heart failure contributing along with COPD: - Continue antibiotics as dictated/recommended by ID; they will follow patient after termination of abx's if still spiking fevers.  - Will continue flutter valve/IS  - Continue pulmicort and PRN xopenex  - Continue low dose lasix   Fever due to abscess, measuring 2 x 5cm on L4-L5. - consult neurosurgery. - Recommendation to continue current abx's therapy.  - Blood cx's has been repeated on 6/22 and have remained negative. - MRI of spine as below. Consult IR for abscess aspirate.  COPD/Emphysema: currently: - No wheezing  - Per pulmonary service plan is to continue bronchial hygiene, pulmicort and PRN bronchodilators (xopenex given A. 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 - Continue heparin drip and coumadin.  - Cardiology consulted and recommended bisoprolol and HR stable.  RLE DVT:  - Continue heparin drip, start coumadin. - Eventually will benefit of xarelto therapy; will follow cardiology rec's on that   Urinary retention: - most likely associated with BPH  -started on flomax approx 5 days ago  - voiding trials were successful and foley has been removed.  GERD: continue PPI   RA:  continue prednisone   acute toxic encephalopathy:  - due to infection  -now resolved  -patient AAOX3.   Dysphagia/deconditioning:   -As per SPT rec's will continue nectar thick liquids and heart healthy diet.  - Patient to be discharge to SNF vs LTAC for rehabilitation once medically stable   Severe protein calorie malnutrition: will follow nutritional service rec's    Code Status: full  Family Communication: no family at bedside  Disposition Plan: SNF    Consultants:  PCCM  cardiology  Procedures: - 6/10 Doppler legs >> partial acute DVT Rt common femoral vein  - 6/16 Echo >> mild LVH, EF 65 to 70%, grade 3 diastolic dysfx, mild AS, mild/mod MS, mod LA dilation  - 6/16 CT chest >> no PE, atherosclerosis, emphysema with interstitial fibrosis, small pericardial effusions, Rt base consolidation, foci of calcification in pericardium  CULTURES:  - Blood 4/1 >>> neg  - Blood 5/26 >>> neg  - Blood ? Date >>> POS MRSA, VRE, PROTEUS.  - Sputum ? Date >>> POS KLEBSIELLA.  - Blood 6/15 >>>  - Urine 6/15 >>>   ANTIBIOTICS:  Cefepime ? >>> 5/15  Daptomycin >>> completed treatment 6/23 per ID)   Antibiotics: daptomycin and meropenem completed treatment 6/23) vanc and meropenm 6.24.2015  HPI/Subjective: Weak, no further complains  Objective: Filed Vitals:   05/06/14 1506 05/06/14 2054 05/06/14 2134 05/07/14 0555  BP: 103/52 110/68  101/68  Pulse: 90 89 86 88  Temp: 98.6 F (37 C) 98.2 F (36.8 C)  98.1 F (36.7 C)  TempSrc: Oral Oral  Oral  Resp: 18 18 18  18  Height:      Weight:    82.89 kg (182 lb 11.8 oz)  SpO2: 96% 100% 99% 100%    Intake/Output Summary (Last 24 hours) at 05/07/14 1215 Last data filed at 05/07/14 1052  Gross per 24 hour  Intake   1360 ml  Output   2126 ml  Net   -766 ml   Filed Weights   05/05/14 0355 05/06/14 0538 05/07/14 0555  Weight: 84.5 kg (186 lb 4.6 oz) 83.7 kg (184 lb 8.4 oz) 82.89 kg (182 lb 11.8 oz)    Exam:  General:  in no acute distress.  HEENT: No bruits, no goiter.  Heart: Regular rate and rhythm, without murmurs, rubs, gallops.  Lungs: Good air movement, clear Abdomen: Soft, nontender, nondistended, positive bowel sounds.     Data Reviewed: Basic Metabolic Panel:  Recent Labs Lab 05/02/14 0310 05/03/14 0250 05/05/14 1205 05/06/14 0355 05/07/14 0507  NA 145 135* 135* 133* 138  K 3.5* 3.6* 3.4* 3.5* 3.9  CL 90* 95* 92* 91* 94*  CO2 31 29 35* 32 37*  GLUCOSE 85 111* 133* 83 101*  BUN 8 9 9 7 14   CREATININE 0.76 0.70 0.66 0.52 0.54  CALCIUM 8.5 8.9 8.9 8.9 9.7   Liver Function Tests: No results found for this basename: AST, ALT, ALKPHOS, BILITOT, PROT, ALBUMIN,  in the last 168 hours No results found for this basename: LIPASE, AMYLASE,  in the last 168 hours No results found for this basename: AMMONIA,  in the last 168 hours CBC:  Recent Labs Lab 05/03/14 0250 05/04/14 0406 05/05/14 0915 05/05/14 1205 05/06/14 0355 05/07/14 0507  WBC 8.2 7.1 7.8  --  6.5 6.8  NEUTROABS  --   --   --  6.9  --   --   HGB 8.7* 7.5* 8.0*  --  7.3* 7.6*  HCT 28.0* 23.8* 25.8*  --  23.5* 24.9*  MCV 84.6 84.4 84.6  --  84.8 86.5  PLT 148* 188 176  --  170 171   Cardiac Enzymes:  Recent Labs Lab 05/03/14 0250  CKTOTAL 54   BNP (last 3 results)  Recent Labs  02/06/14 0605 02/15/14 0640 04/30/14 0953  PROBNP 2426.0* 1292.0* 1579.0*   CBG:  Recent Labs Lab 05/06/14 1113 05/06/14 1619 05/06/14 2019 05/07/14 0554 05/07/14 1116  GLUCAP 102* 163* 161* 85 104*    Recent Results (from the past 240 hour(s))  CULTURE, BLOOD (ROUTINE X 2)     Status: None   Collection Time    05/03/14  3:01 PM      Result Value Ref Range Status   Specimen Description BLOOD LEFT ARM   Final   Special Requests BOTTLES DRAWN AEROBIC AND ANAEROBIC 10CC   Final   Culture  Setup Time     Final   Value: 05/03/2014 18:56     Performed at [facility]   Culture     Final   Value:        BLOOD  CULTURE RECEIVED NO GROWTH TO DATE CULTURE WILL BE HELD FOR 5 DAYS BEFORE ISSUING A FINAL NEGATIVE REPORT     Performed at [facility]   Report Status PENDING   Incomplete  CULTURE, BLOOD (ROUTINE X 2)     Status: None   Collection Time    05/03/14  3:14 PM      Result Value Ref Range Status   Specimen Description BLOOD LEFT HAND   Final  Special Requests BOTTLES DRAWN AEROBIC ONLY 3CC   Final   Culture  Setup Time     Final   Value: 05/03/2014 18:58     [facility]   Culture     Final   Value:        BLOOD CULTURE RECEIVED NO GROWTH TO DATE CULTURE WILL BE HELD FOR 5 DAYS BEFORE ISSUING A FINAL NEGATIVE REPORT     [facility]   Report Status PENDING   Incomplete  URINE CULTURE     Status: None   Collection Time    05/04/14 10:02 PM      Result Value Ref Range Status   Specimen Description URINE, CLEAN CATCH   Final   Special Requests NONE   Final   Culture  Setup Time     Final   Value: 05/05/2014 04:27     [facility]formed at [facility]   Colony Count     Final   Value: NO GROWTH     [facility]   Culture     Final   Value: NO GROWTH     [facility]formed at [facility]   Report Status 05/06/2014 FINAL   Final  CATH TIP CULTURE     Status: None   Collection Time    05/05/14  2:18 PM      Result Value Ref Range Status   Specimen Description CATH TIP   Final   Special Requests NONE   Final   Culture     Final   Value: NO GROWTH 2 DAYS     [facility] at [facility]   Report Status 05/07/2014 FINAL   Final  CULTURE, BLOOD (ROUTINE X 2)     Status: None   Collection Time    05/06/14 12:05 PM      Result Value Ref Range Status   Specimen Description BLOOD RIGHT HAND   Final   Special Requests BOTTLES DRAWN AEROBIC ONLY 6CC   Final   Culture  Setup Time     Final   Value: 05/06/2014 18:53     Performed at [facility]   Culture     Final   Value:        BLOOD CULTURE RECEIVED  NO GROWTH TO DATE CULTURE WILL BE HELD FOR 5 DAYS BEFORE ISSUING A FINAL NEGATIVE REPORT     [facility]formed at [facility]   Report Status PENDING   Incomplete  CULTURE, BLOOD (ROUTINE X 2)     Status: None   Collection Time    05/06/14 12:15 PM      Result Value Ref Range Status   Specimen Description BLOOD RIGHT FOREARM   Final   Special Requests BOTTLES DRAWN AEROBIC ONLY [facility]7CC   Final   Culture  Setup Time     Final   Value: 05/06/2014 18:54     [facility]formed at [facility]   Culture     Final   Value:        BLOOD CULTURE RECEIVED NO GROWTH TO DATE CULTURE WILL BE HELD FOR 5 DAYS BEFORE ISSUING A FINAL NEGATIVE REPORT     [facility]formed at [facility]   Report Status PENDING   Incomplete     Studies: Mr Lumbar Spine W Wo Contrast  05/07/2014   CLINICAL DATA:  Wound on the back. Infection following spinal fusion attempt.  EXAM: MRI LUMBAR SPINE WITHOUT AND  WITH CONTRAST  TECHNIQUE: Multiplanar and multiecho pulse sequences of the lumbar spine were obtained without and with intravenous contrast.  CONTRAST:  62mL MULTIHANCE GADOBENATE DIMEGLUMINE 529 MG/ML IV SOLN  COMPARISON:  Radiographs 03/17/2014. MRI 11/10/2013. Intraoperative fluoroscopy 01/04/2014.  FINDINGS: Segmentation: Numbering used on prior exam is preserved with 5 lumbar type vertebral bodies.  Alignment: Straightening of the normal lumbar lordosis with slight reversal centered around L2-L3. Grade I retrolisthesis of L4 on L5 measuring 4 mm.  Vertebrae: Discitis/osteomyelitis is present at L3-L4, with endplate edema and enhancement.There is irregularity the endplates which is new compared to the prior exam from 2014 compatible osteolysis from disc space infection. The L3-L4 disc appears liquified.  Postsurgical changes are present at L4-L5 and L5-S1. No compression fracture.  Screw tracts are present in the L4 through S1 vertebrae compatible with removed rod and pedicle screw hardware.  Conus medullaris: Terminates at  L1-L2, normal. Unchanged from prior.  Paraspinal tissues: Paraspinal muscular edema is most pronounced around the L3-L4 level but extends into both iliopsoas muscles. There is chronic LEFT psoas muscle atrophy. No psoas muscle abscess is identified. There is a RIGHT eccentric fluid collection dorsal to the laminectomies at L4 and L5 probably representing abscess. This measures 5.3 cm transverse by 1.8 cm AP and extends up to the posterior aspect of the thecal sac. On post gadolinium imaging, this demonstrates irregular peripheral enhancement, most suggestive of abscess. Areas of susceptibility artifact are present at the periphery. A spinal leak or seroma is considered less likely.  There are several areas of right-sided subcutaneous ulceration with cellulitis. Wound dressing material appears present in the dorsal back. On the sagittal post gadolinium imaging, the right-sided fluid collection appears to communicate with the superficial ulcerations although this is incompletely imaged (image 3 series 9).  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  Disc levels:  L1-L2: Moderate central stenosis associated with a central disc extrusion with cranial extension. There is central thickening of the posterior longitudinal ligament that extends to the L2-L3 disc space. This probably represents an old disc extrusion anterior to the posterior longitudinal ligament. The spinal canal is congenitally narrow. No change from prior. Foramina patent.  L2-L3: Disc degeneration and desiccation with loss of height. Broad-based posterior disc bulging. Mild central stenosis. Foramina appear adequately patent.  L3-L4: Liquefaction of the disc. Discitis/ osteomyelitis. Enhancing granulation tissue extends deep to the posterior longitudinal ligament dorsal to L4 from the disc space. No discrete epidural abscess. L3 laminectomy with recurrent moderate central stenosis. Moderate RIGHT and mild LEFT foraminal stenosis. Bilateral lateral  recess stenosis.  L4-L5: Laminectomy. Central canal is decompressed. Discectomy with interbody bone graft. Enhancing granulation tissue present in the epidural space and around both neural foramina. RIGHT eccentric previously mentioned posterior fluid collection extending inferiorly.  L5-S1: Discectomy and laminectomy. Central canal and lateral recesses decompressed. Moderate bilateral foraminal stenosis. Epidural granulation tissue.  IMPRESSION: 1. Subcutaneous ulceration in the dorsal soft tissues of the back eccentric to the RIGHT. Deep fluid collection compatible with abscess in the laminectomy bed at L4-L5 with tract communicating with the superficial ulcerations to the RIGHT of midline. Tract is best visualized on sagittal image number 3 series 9. 2. Development of discitis/osteomyelitis at L3-L4 with moderate central stenosis. No definite epidural abscess. 3. L4-L5 and L5-S1 laminectomy with remove rod and pedicle screw hardware.   Electronically Signed   By: Andreas Newport M.D.   On: 05/07/2014 11:44    Scheduled Meds: . antiseptic oral rinse  15  mL Mouth Rinse q12n4p  . aspirin EC  81 mg Oral Daily  . bisoprolol  2.5 mg Oral Daily  . budesonide (PULMICORT) nebulizer solution  0.25 mg Nebulization BID  . collagenase   Topical Daily  . coumadin book   Does not apply Once  . enoxaparin (LOVENOX) injection  80 mg Subcutaneous Q12H  . feeding supplement (PRO-STAT SUGAR FREE 64)  30 mL Oral TID WC  . feeding supplement (RESOURCE BREEZE)  1 Container Oral Q24H  . folic acid  1 mg Oral Daily  . [START ON 05/08/2014] furosemide  20 mg Oral BID  . insulin aspart  0-15 Units Subcutaneous TID WC  . lactobacillus acidophilus  1 tablet Oral BID  . lidocaine  1 patch Transdermal Daily  . meropenem (MERREM) IV  1 g Intravenous 3 times per day  . mirtazapine  15 mg Oral QHS  . multivitamin with minerals  1 tablet Oral Daily  . nutrition supplement (JUVEN)  1 packet Oral BID BM  . pantoprazole  40 mg  Oral Q1200  . predniSONE  10 mg Oral Q breakfast  . pregabalin  100 mg Oral TID  . senna  1 tablet Oral QHS  . sodium chloride  10-40 mL Intracatheter Q12H  . tamsulosin  0.4 mg Oral Daily  . vancomycin  750 mg Intravenous Q12H  . vitamin C  250 mg Oral TID WC  . warfarin  7.5 mg Oral ONCE-1800  . warfarin   Does not apply Once  . Warfarin - Pharmacist Dosing Inpatient   Does not apply q1800   Continuous Infusions: . sodium chloride 10 mL/hr at 05/04/14 0503     Marinda Elk  [facility] Pager [phone]. If 8PM-8AM, please contact night-coverage at [email or web address], password [facility] 05/07/2014, 12:15 PM  LOS: 11 days    **Disclaimer: This note may have been dictated with voice recognition software. Similar sounding words can inadvertently be transcribed and this note may contain transcription errors which may not have been corrected upon publication of note.**

## 2014-05-07 NOTE — Progress Notes (Signed)
PT Cancellation Note  Patient Details Name: Jon Lozano MRN: [id number] DOB: [birth year]   Cancelled Treatment:    Reason Eval/Treat Not Completed: Patient at procedure or test/unavailable. Pt currently off unit for MRI. Will continue to follow.    Ruthann Cancer 05/07/2014, 9:09 AM  Ruthann Cancer, PT, DPT Acute Rehabilitation Services Pager: [phone]

## 2014-05-08 ENCOUNTER — Inpatient Hospital Stay (HOSPITAL_COMMUNITY): Payer: Medicare Other

## 2014-05-08 LAB — CBC
HEMATOCRIT: 27.2 % — AB (ref 39.0–52.0)
HEMOGLOBIN: 8.3 g/dL — AB (ref 13.0–17.0)
MCH: 26.9 pg (ref 26.0–34.0)
MCHC: 30.5 g/dL (ref 30.0–36.0)
MCV: 88.3 fL (ref 78.0–100.0)
Platelets: 266 10*3/uL (ref 150–400)
RBC: 3.08 MIL/uL — ABNORMAL LOW (ref 4.22–5.81)
RDW: 17.4 % — AB (ref 11.5–15.5)
WBC: 9.7 10*3/uL (ref 4.0–10.5)

## 2014-05-08 LAB — PROTIME-INR
INR: 1.81 — AB (ref 0.00–1.49)
Prothrombin Time: 21 seconds — ABNORMAL HIGH (ref 11.6–15.2)

## 2014-05-08 LAB — GLUCOSE, CAPILLARY
GLUCOSE-CAPILLARY: 95 mg/dL (ref 70–99)
GLUCOSE-CAPILLARY: 115 mg/dL — AB (ref 70–99)
Glucose-Capillary: 109 mg/dL — ABNORMAL HIGH (ref 70–99)
Glucose-Capillary: 129 mg/dL — ABNORMAL HIGH (ref 70–99)

## 2014-05-08 LAB — BASIC METABOLIC PANEL
BUN: 13 mg/dL (ref 6–23)
CO2: 36 mEq/L — ABNORMAL HIGH (ref 19–32)
Calcium: 9.7 mg/dL (ref 8.4–10.5)
Chloride: 95 mEq/L — ABNORMAL LOW (ref 96–112)
Creatinine, Ser: 0.55 mg/dL (ref 0.50–1.35)
Glucose, Bld: 88 mg/dL (ref 70–99)
POTASSIUM: 4.4 meq/L (ref 3.7–5.3)
SODIUM: 140 meq/L (ref 137–147)

## 2014-05-08 LAB — VANCOMYCIN, TROUGH: Vancomycin Tr: 23.1 ug/mL — ABNORMAL HIGH (ref 10.0–20.0)

## 2014-05-08 MED ORDER — VANCOMYCIN HCL 10 G IV SOLR
1500.0000 mg | INTRAVENOUS | Status: DC
  Administered 2014-05-09: 1500 mg via INTRAVENOUS
  Filled 2014-05-08: qty 1500

## 2014-05-08 MED ORDER — SODIUM CHLORIDE 0.9 % IJ SOLN
10.0000 mL | INTRAMUSCULAR | Status: DC | PRN
  Administered 2014-05-09: 10 mL

## 2014-05-08 MED ORDER — SODIUM CHLORIDE 0.9 % IJ SOLN
10.0000 mL | Freq: Two times a day (BID) | INTRAMUSCULAR | Status: DC
Start: 2014-05-08 — End: 2014-05-09
  Administered 2014-05-08: 10 mL

## 2014-05-08 MED ORDER — VANCOMYCIN HCL IN DEXTROSE 750-5 MG/150ML-% IV SOLN
750.0000 mg | Freq: Two times a day (BID) | INTRAVENOUS | Status: DC
  Filled 2014-05-08: qty 150

## 2014-05-08 MED ORDER — WARFARIN SODIUM 5 MG PO TABS
5.0000 mg | ORAL_TABLET | Freq: Once | ORAL | Status: AC
  Administered 2014-05-08: 5 mg via ORAL
  Filled 2014-05-08: qty 1

## 2014-05-08 NOTE — Progress Notes (Addendum)
Chart reviewed. Pt was for possible deep tissue paraspinal fluid aspiration however, there was difficulty in reaching wife last pm. She is here this morning. Lovenox and Heparin were held, unfortunately, pt has continued to receive Coumadin, including 7.5mg  dose last pm. INR is now up to 1.8 Dr. Corliss Skains will not do needle aspiration while INR is elevated. Recommend DC Coumadin and resume either Heparin or Lovenox. The pt is on IV antibiotics and strong evidence of osteo on MRI, yield for culture of fluid aspiration would be low, but if still requested, could potentially do Mon if INR has returned to normal. This was all discussed with pt wife.  Based on WOCN description/assessment of wound, open exploration may be a better option. Attending MD agrees and has been attempting to get appropriate consultation.  Discussed with Dr. Corliss Skains.  Brayton El PA-C Interventional Radiology 05/08/2014 11:22 AM

## 2014-05-08 NOTE — Progress Notes (Signed)
ANTICOAGULATION CONSULT NOTE - Follow Up Consult  Pharmacy Consult for Lovenox + warfarin Indication: DVT, afib  No Known Allergies  Patient Measurements: Height: 5\' 8"  (172.7 cm) Weight: 184 lb 11.9 oz (83.8 kg) IBW/kg (Calculated) : 68.4 Heparin Dosing Weight: 84kg  Vital Signs: Temp: 100.6 F (38.1 C) (06/27 0636) Temp src: Oral (06/27 0636) BP: 111/74 mmHg (06/27 0636) Pulse Rate: 101 (06/27 0636)  Labs:  Recent Labs  05/05/14 0915  05/06/14 0355 05/07/14 0507 05/08/14 0459  HGB 8.0*  --  7.3* 7.6* 8.3*  HCT 25.8*  --  23.5* 24.9* 27.2*  PLT 176  --  170 171 266  LABPROT  --   --  16.7* 16.4* 21.0*  INR  --   --  1.35 1.32 1.81*  HEPARINUNFRC 0.61  --  0.44 0.59  --   CREATININE  --   < > 0.52 0.54 0.55  < > = values in this interval not displayed.  Estimated Creatinine Clearance: 89.4 ml/min (by C-G formula based on Cr of 0.55).  Medications:  Lovenox 80 mg subcut q12h  Assessment: 73 yo M s/p 3 days of heparin + warfarin overlap for RLE DVT and Afib, transitioned to lovenox on 6/26. He needs at least 5 days of overlap therapy and INR to be therapeutic for 24 hours before lovenox can be stopped. INR this morning is SUBtherapeutic but rising 1.32>1.81 after an increased dose yesterday evening. Hgb low but trending up, platelets WNL. No bleeding noted.  Goal of Therapy:  INR: 2-3 Monitor platelets by anticoagulation protocol: Yes   Plan:  1. Continue lovenox 80 mg subcut q12h 2. Warfarin 5mg  po x1 tonight  3. Daily HL, INR and CBC 4. Follow for s/s bleeding  7/26, PharmD Clinical Pharmacist - Resident Pager: [phone] Pharmacy: [phone] 05/08/2014 7:51 AM

## 2014-05-08 NOTE — Consult Note (Signed)
Reason for Consult: Lumbar osteomyelitis, discitis, epidural abscess Referring Physician: Dr. Aileen Fass  Jon Lozano is an 73 y.o. male.  HPI: The patient is a 73 year old white male who has a very complicated history. The patient had a lumbar decompression and fusion by Dr. Donivan Scull, an orthopedic surgeon in [place] in February of 2015. The patient developed a postoperative wound infection and has had at least 2 incision and drainage of his wound. With his third incision and drainage the patient had his hardware removed. The patient has been in and out of [place], [facility], [facility], etc. since then. The patient has had a wound vacuum in place in the past for quite some time.  Most recently the patient was admitted with sepsis. He had a lumbar MRI which demonstrated L3-4 discitis, epidural abscess, osteomyelitis, etc.. since it was an orthopedic surgeon who did his original surgery and since we have orthopedic surgeons who do this type of surgery here at [facility], orthopedics was consulted. I am told that they refused to see the patient. Therefore I was consulted. The patient has also been seen by interventional radiology to place to do a needle aspiration of this epidural fluid collection.  Presently the patient is accompanied by his wife. He appears frail and debilitated.  Past Medical History  Diagnosis Date  . Hypertension   . Heart murmur   . Peripheral vascular disease   . Arthritis   . Hepatitis 1954  . Mitral stenosis   . Aortic stenosis   . Atrial fibrillation   . Pericardial calcification     Past Surgical History  Procedure Laterality Date  . Cholecystectomy  1983  . Pericardial fluid drainage  2007  . Cataract extraction  2012  . Tee without cardioversion  10/23/2012    Procedure: TRANSESOPHAGEAL ECHOCARDIOGRAM (TEE);  Surgeon: Sanda Klein, MD;  Location: [facility] ENDOSCOPY;  Service: Cardiovascular;  Laterality: N/A;  to  come in1130 for work up    Family History  Problem Relation Age of Onset  . Heart disease Father     Social History:  reports that he has quit smoking. His smoking use included Cigarettes. He smoked 1.00 pack per day. He quit smokeless tobacco use about 2 years ago. He reports that he drinks alcohol. He reports that he does not use illicit drugs.  Allergies: No Known Allergies  Medications:  I have reviewed the patient's current medications. Prior to Admission:  Prescriptions prior to admission  Medication Sig Dispense Refill  . Amino Acids-Protein Hydrolys (FEEDING SUPPLEMENT, PRO-STAT SUGAR FREE 64,) LIQD Take 30 mLs by mouth 3 (three) times daily with meals.      Marland Kitchen aspirin 81 MG tablet Take 81 mg by mouth daily.      Marland Kitchen DAPTOmycin in sodium chloride 0.9 % 100 mL Inject 500 mg into the vein daily.      . ferrous sulfate 325 (65 FE) MG tablet Take 325 mg by mouth 3 (three) times daily with meals.      . folic acid (FOLVITE) 1 MG tablet Take 1 mg by mouth daily.      . furosemide (LASIX) 20 MG tablet Take 20 mg by mouth 2 (two) times daily.      . Guaifenesin (MUCINEX MAXIMUM STRENGTH) 1200 MG TB12 Take 1 tablet by mouth 2 (two) times daily.      Marland Kitchen lactobacillus acidophilus (BACID) TABS tablet Take 1 tablet by mouth 2 (two) times daily.       Marland Kitchen  lidocaine (LIDODERM) 5 % Place 1 patch onto the skin daily. Remove & Discard patch within 12 hours or as directed by MD      . meropenem 1 g in sodium chloride 0.9 % 100 mL Inject 1 g into the vein every 8 (eight) hours. For total 6 weeks      . mirtazapine (REMERON) 15 MG tablet Take 15 mg by mouth at bedtime.      . Multiple Vitamins-Minerals (MULTIVITAMINS THER. W/MINERALS) TABS tablet Take 1 tablet by mouth daily.      Marland Kitchen oxycodone (OXY-IR) 5 MG capsule Take 5 mg by mouth 2 (two) times daily.      . pantoprazole (PROTONIX) 40 MG tablet Take 40 mg by mouth daily.      . polyethylene glycol (MIRALAX / GLYCOLAX) packet Take 17 g by mouth 2 (two)  times daily.      . potassium chloride SA (K-DUR,KLOR-CON) 20 MEQ tablet Take 40 mEq by mouth daily.      . predniSONE (DELTASONE) 10 MG tablet Take 10 mg by mouth daily with breakfast.      . pregabalin (LYRICA) 100 MG capsule Take 100 mg by mouth 3 (three) times daily.      Marland Kitchen senna (SENOKOT) 8.6 MG TABS tablet Take 1 tablet by mouth at bedtime.      . tamsulosin (FLOMAX) 0.4 MG CAPS capsule Take 0.4 mg by mouth daily.      . vitamin C (ASCORBIC ACID) 250 MG tablet Take 250 mg by mouth 3 (three) times daily with meals.      . zinc sulfate 220 MG capsule Take 220 mg by mouth daily.      . clopidogrel (PLAVIX) 75 MG tablet Take 75 mg by mouth daily.       Scheduled: . antiseptic oral rinse  15 mL Mouth Rinse q12n4p  . aspirin EC  81 mg Oral Daily  . bisoprolol  2.5 mg Oral Daily  . budesonide (PULMICORT) nebulizer solution  0.25 mg Nebulization BID  . collagenase   Topical Daily  . enoxaparin (LOVENOX) injection  80 mg Subcutaneous Q12H  . feeding supplement (PRO-STAT SUGAR FREE 64)  30 mL Oral TID WC  . feeding supplement (RESOURCE BREEZE)  1 Container Oral Q24H  . folic acid  1 mg Oral Daily  . furosemide  20 mg Oral BID  . insulin aspart  0-15 Units Subcutaneous TID WC  . lactobacillus acidophilus  1 tablet Oral BID  . lidocaine  1 patch Transdermal Daily  . meropenem (MERREM) IV  1 g Intravenous 3 times per day  . mirtazapine  15 mg Oral QHS  . multivitamin with minerals  1 tablet Oral Daily  . nutrition supplement (JUVEN)  1 packet Oral BID BM  . pantoprazole  40 mg Oral Q1200  . predniSONE  10 mg Oral Q breakfast  . pregabalin  100 mg Oral TID  . senna  1 tablet Oral QHS  . sodium chloride  10-40 mL Intracatheter Q12H  . tamsulosin  0.4 mg Oral Daily  . vancomycin  750 mg Intravenous Q12H  . vitamin C  250 mg Oral TID WC  . warfarin  5 mg Oral ONCE-1800  . warfarin   Does not apply Once  . Warfarin - Pharmacist Dosing Inpatient   Does not apply q1800   Continuous: .  sodium chloride 10 mL/hr at 05/07/14 2239   MEQ:ASTMHD chloride, acetaminophen, levalbuterol, oxyCODONE, RESOURCE THICKENUP CLEAR, sodium chloride Anti-infectives  Start     Dose/Rate Route Frequency Ordered Stop   05/08/14 2100  vancomycin (VANCOCIN) IVPB 750 mg/150 ml premix     750 mg 150 mL/hr over 60 Minutes Intravenous Every 12 hours 05/08/14 1012     05/06/14 1700  vancomycin (VANCOCIN) IVPB 750 mg/150 ml premix  Status:  Discontinued     750 mg 150 mL/hr over 60 Minutes Intravenous Every 12 hours 05/06/14 1354 05/08/14 1012   05/04/14 2000  vancomycin (VANCOCIN) IVPB 1000 mg/200 mL premix  Status:  Discontinued     1,000 mg 200 mL/hr over 60 Minutes Intravenous Every 8 hours 05/04/14 1102 05/06/14 1326   05/04/14 1400  meropenem (MERREM) 1 g in sodium chloride 0.9 % 100 mL IVPB     1 g 200 mL/hr over 30 Minutes Intravenous 3 times per day 05/04/14 1216     05/04/14 1130  vancomycin (VANCOCIN) 1,250 mg in sodium chloride 0.9 % 250 mL IVPB     1,250 mg 166.7 mL/hr over 90 Minutes Intravenous  Once 05/04/14 1102 05/04/14 1300   04/27/14 1200  DAPTOmycin (CUBICIN) 500 mg in sodium chloride 0.9 % IVPB  Status:  Discontinued     500 mg 220 mL/hr over 30 Minutes Intravenous Every 24 hours 04/26/14 2227 05/04/14 1053   04/26/14 2230  meropenem (MERREM) 1 g in sodium chloride 0.9 % 100 mL IVPB     1 g 200 mL/hr over 30 Minutes Intravenous 3 times per day 04/26/14 2227 05/04/14 2359       Results for orders placed during the hospital encounter of 04/26/14 (from the past 48 hour(s))  VANCOMYCIN, TROUGH     Status: Abnormal   Collection Time    05/06/14 12:05 PM      Result Value Ref Range   Vancomycin Tr 29.4 (*) 10.0 - 20.0 ug/mL   Comment: CRITICAL RESULT CALLED TO, READ BACK BY AND VERIFIED WITH:     TEASLEY,L RN @ 3295 05/06/14 LEONARD,A  CULTURE, BLOOD (ROUTINE X 2)     Status: None   Collection Time    05/06/14 12:05 PM      Result Value Ref Range   Specimen Description  BLOOD RIGHT HAND     Special Requests BOTTLES DRAWN AEROBIC ONLY 6CC     Culture  Setup Time       Value: 05/06/2014 18:53     Performed at [facility]   Culture       Value:        BLOOD CULTURE RECEIVED NO GROWTH TO DATE CULTURE WILL BE HELD FOR 5 DAYS BEFORE ISSUING A FINAL NEGATIVE REPORT     Performed at [facility]   Report Status PENDING    CULTURE, BLOOD (ROUTINE X 2)     Status: None   Collection Time    05/06/14 12:15 PM      Result Value Ref Range   Specimen Description BLOOD RIGHT FOREARM     Special Requests BOTTLES DRAWN AEROBIC ONLY [place]     Culture  Setup Time       Value: 05/06/2014 18:54     Performed at [facility]   Culture       Value:        BLOOD CULTURE RECEIVED NO GROWTH TO DATE CULTURE WILL BE HELD FOR 5 DAYS BEFORE ISSUING A FINAL NEGATIVE REPORT     Performed at [facility]   Report Status PENDING    GLUCOSE, CAPILLARY  Status: Abnormal   Collection Time    05/06/14  4:19 PM      Result Value Ref Range   Glucose-Capillary 163 (*) 70 - 99 mg/dL  GLUCOSE, CAPILLARY     Status: Abnormal   Collection Time    05/06/14  8:19 PM      Result Value Ref Range   Glucose-Capillary 161 (*) 70 - 99 mg/dL  HEPARIN LEVEL (UNFRACTIONATED)     Status: None   Collection Time    05/07/14  5:07 AM      Result Value Ref Range   Heparin Unfractionated 0.59  0.30 - 0.70 IU/mL   Comment:            IF HEPARIN RESULTS ARE BELOW     EXPECTED VALUES, AND PATIENT     DOSAGE HAS BEEN CONFIRMED,     SUGGEST FOLLOW UP TESTING     OF ANTITHROMBIN III LEVELS.  CBC     Status: Abnormal   Collection Time    05/07/14  5:07 AM      Result Value Ref Range   WBC 6.8  4.0 - 10.5 K/uL   RBC 2.88 (*) 4.22 - 5.81 MIL/uL   Hemoglobin 7.6 (*) 13.0 - 17.0 g/dL   HCT 24.9 (*) 39.0 - 52.0 %   MCV 86.5  78.0 - 100.0 fL   MCH 26.4  26.0 - 34.0 pg   MCHC 30.5  30.0 - 36.0 g/dL   RDW 17.1 (*) 11.5 - 15.5 %   Platelets 171  150 - 400 K/uL  BASIC  METABOLIC PANEL     Status: Abnormal   Collection Time    05/07/14  5:07 AM      Result Value Ref Range   Sodium 138  137 - 147 mEq/L   Potassium 3.9  3.7 - 5.3 mEq/L   Chloride 94 (*) 96 - 112 mEq/L   CO2 37 (*) 19 - 32 mEq/L   Glucose, Bld 101 (*) 70 - 99 mg/dL   BUN 14  6 - 23 mg/dL   Creatinine, Ser 0.54  0.50 - 1.35 mg/dL   Calcium 9.7  8.4 - 10.5 mg/dL   GFR calc non Af Amer >90  >90 mL/min   GFR calc Af Amer >90  >90 mL/min   Comment: (NOTE)     The eGFR has been calculated using the CKD EPI equation.     This calculation has not been validated in all clinical situations.     eGFR's persistently <90 mL/min signify possible Chronic Kidney     Disease.  PROTIME-INR     Status: Abnormal   Collection Time    05/07/14  5:07 AM      Result Value Ref Range   Prothrombin Time 16.4 (*) 11.6 - 15.2 seconds   INR 1.32  0.00 - 1.49  GLUCOSE, CAPILLARY     Status: None   Collection Time    05/07/14  5:54 AM      Result Value Ref Range   Glucose-Capillary 85  70 - 99 mg/dL  GLUCOSE, CAPILLARY     Status: Abnormal   Collection Time    05/07/14 11:16 AM      Result Value Ref Range   Glucose-Capillary 104 (*) 70 - 99 mg/dL  GLUCOSE, CAPILLARY     Status: Abnormal   Collection Time    05/07/14  4:23 PM      Result Value Ref Range   Glucose-Capillary 145 (*) 70 -  99 mg/dL  GLUCOSE, CAPILLARY     Status: Abnormal   Collection Time    05/07/14  9:04 PM      Result Value Ref Range   Glucose-Capillary 119 (*) 70 - 99 mg/dL  CBC     Status: Abnormal   Collection Time    05/08/14  4:59 AM      Result Value Ref Range   WBC 9.7  4.0 - 10.5 K/uL   RBC 3.08 (*) 4.22 - 5.81 MIL/uL   Hemoglobin 8.3 (*) 13.0 - 17.0 g/dL   HCT 67.5 (*) 19.8 - 24.2 %   MCV 88.3  78.0 - 100.0 fL   MCH 26.9  26.0 - 34.0 pg   MCHC 30.5  30.0 - 36.0 g/dL   RDW 99.8 (*) 06.9 - 99.6 %   Platelets 266  150 - 400 K/uL   Comment: REPEATED TO VERIFY  BASIC METABOLIC PANEL     Status: Abnormal   Collection Time     05/08/14  4:59 AM      Result Value Ref Range   Sodium 140  137 - 147 mEq/L   Potassium 4.4  3.7 - 5.3 mEq/L   Chloride 95 (*) 96 - 112 mEq/L   CO2 36 (*) 19 - 32 mEq/L   Glucose, Bld 88  70 - 99 mg/dL   BUN 13  6 - 23 mg/dL   Creatinine, Ser 7.22  0.50 - 1.35 mg/dL   Calcium 9.7  8.4 - 77.3 mg/dL   GFR calc non Af Amer >90  >90 mL/min   GFR calc Af Amer >90  >90 mL/min   Comment: (NOTE)     The eGFR has been calculated using the CKD EPI equation.     This calculation has not been validated in all clinical situations.     eGFR's persistently <90 mL/min signify possible Chronic Kidney     Disease.  PROTIME-INR     Status: Abnormal   Collection Time    05/08/14  4:59 AM      Result Value Ref Range   Prothrombin Time 21.0 (*) 11.6 - 15.2 seconds   INR 1.81 (*) 0.00 - 1.49  GLUCOSE, CAPILLARY     Status: None   Collection Time    05/08/14  6:09 AM      Result Value Ref Range   Glucose-Capillary 95  70 - 99 mg/dL  GLUCOSE, CAPILLARY     Status: Abnormal   Collection Time    05/08/14 11:22 AM      Result Value Ref Range   Glucose-Capillary 115 (*) 70 - 99 mg/dL    Mr Lumbar Spine W Wo Contrast  05/07/2014   CLINICAL DATA:  Wound on the back. Infection following spinal fusion attempt.  EXAM: MRI LUMBAR SPINE WITHOUT AND WITH CONTRAST  TECHNIQUE: Multiplanar and multiecho pulse sequences of the lumbar spine were obtained without and with intravenous contrast.  CONTRAST:  29mL MULTIHANCE GADOBENATE DIMEGLUMINE 529 MG/ML IV SOLN  COMPARISON:  Radiographs 03/17/2014. MRI 11/10/2013. Intraoperative fluoroscopy 01/04/2014.  FINDINGS: Segmentation: Numbering used on prior exam is preserved with 5 lumbar type vertebral bodies.  Alignment: Straightening of the normal lumbar lordosis with slight reversal centered around L2-L3. Grade I retrolisthesis of L4 on L5 measuring 4 mm.  Vertebrae: Discitis/osteomyelitis is present at L3-L4, with endplate edema and enhancement.There is irregularity the  endplates which is new compared to the prior exam from 2014 compatible osteolysis from disc space infection. The L3-L4 disc  appears liquified.  Postsurgical changes are present at L4-L5 and L5-S1. No compression fracture.  Screw tracts are present in the L4 through S1 vertebrae compatible with removed rod and pedicle screw hardware.  Conus medullaris: Terminates at L1-L2, normal. Unchanged from prior.  Paraspinal tissues: Paraspinal muscular edema is most pronounced around the L3-L4 level but extends into both iliopsoas muscles. There is chronic LEFT psoas muscle atrophy. No psoas muscle abscess is identified. There is a RIGHT eccentric fluid collection dorsal to the laminectomies at L4 and L5 probably representing abscess. This measures 5.3 cm transverse by 1.8 cm AP and extends up to the posterior aspect of the thecal sac. On post gadolinium imaging, this demonstrates irregular peripheral enhancement, most suggestive of abscess. Areas of susceptibility artifact are present at the periphery. A spinal leak or seroma is considered less likely.  There are several areas of right-sided subcutaneous ulceration with cellulitis. Wound dressing material appears present in the dorsal back. On the sagittal post gadolinium imaging, the right-sided fluid collection appears to communicate with the superficial ulcerations although this is incompletely imaged (image 3 series 9).  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  Disc levels:  L1-L2: Moderate central stenosis associated with a central disc extrusion with cranial extension. There is central thickening of the posterior longitudinal ligament that extends to the L2-L3 disc space. This probably represents an old disc extrusion anterior to the posterior longitudinal ligament. The spinal canal is congenitally narrow. No change from prior. Foramina patent.  L2-L3: Disc degeneration and desiccation with loss of height. Broad-based posterior disc bulging. Mild central  stenosis. Foramina appear adequately patent.  L3-L4: Liquefaction of the disc. Discitis/ osteomyelitis. Enhancing granulation tissue extends deep to the posterior longitudinal ligament dorsal to L4 from the disc space. No discrete epidural abscess. L3 laminectomy with recurrent moderate central stenosis. Moderate RIGHT and mild LEFT foraminal stenosis. Bilateral lateral recess stenosis.  L4-L5: Laminectomy. Central canal is decompressed. Discectomy with interbody bone graft. Enhancing granulation tissue present in the epidural space and around both neural foramina. RIGHT eccentric previously mentioned posterior fluid collection extending inferiorly.  L5-S1: Discectomy and laminectomy. Central canal and lateral recesses decompressed. Moderate bilateral foraminal stenosis. Epidural granulation tissue.  IMPRESSION: 1. Subcutaneous ulceration in the dorsal soft tissues of the back eccentric to the RIGHT. Deep fluid collection compatible with abscess in the laminectomy bed at L4-L5 with tract communicating with the superficial ulcerations to the RIGHT of midline. Tract is best visualized on sagittal image number 3 series 9. 2. Development of discitis/osteomyelitis at L3-L4 with moderate central stenosis. No definite epidural abscess. 3. L4-L5 and L5-S1 laminectomy with remove rod and pedicle screw hardware.   Electronically Signed   By: Dereck Ligas M.D.   On: 05/07/2014 11:44    ROS Blood pressure 111/74, pulse 101, temperature 100.6 F (38.1 C), temperature source Oral, resp. rate 17, height $RemoveBe'5\' 8"'[id number]$  (1.727 m), weight 83.8 kg (184 lb 11.9 oz), SpO2 96.00%. Physical Exam  General: A frail and debilitated 73 year old white male in no apparent distress who is a bit somnolent but easily arousable.  Neurologic exam: The patient is somnolent but arousable. He is globally weak. His strength is approximately 4/5 diffusely and in his bilateral gastrocnemius.  The patient's lumbar wound is open at several areas. He  has some purulent-appearing discharge. The surrounding tissues are quite firm erythremmatous.  I have reviewed the patient's lumbar MRI performed at [facility] on 05/06/2014. The patient has an interbody fusion at L4-5 and  L5-S1 with interbody prosthesis. The pedicle screws from L4-S1 have been removed. There is evidence of L3-4 discitis/osteomyelitis and possibly at L4-5 and L5-S1 as well. The patient has a dorsal epidural fluid collection assistant with epidural infection.  Assessment/Plan: Lumbar discitis, osteomyelitis, epidural abscess: I agree with a needle aspiration of this epidural fluid. The patient does not have significant neural compression from this fluid so I don't think he needs surgery presently, particularly in light of the fact that his had multiple incision and drainages him a wound healing problems, and he is frail. I spoke with Dr. Olevia Bowens. I recommend that the patient be treated with antibiotics. Furthermore I recommended that when the patient is medically stabilized he be returned to the care of Dr. Donivan Scull, his local orthopedic surgeon, who has done his surgeries. I'll sign off.  JENKINS,JEFFREY D 05/08/2014, 11:47 AM

## 2014-05-08 NOTE — Progress Notes (Signed)
1600 PIcc line today IV team staff, Hessie Diener obtained consent from pt's s[pouse

## 2014-05-08 NOTE — Progress Notes (Signed)
1910   Pt's face flushed and pt with  Feeling  Of  Cool sensation, shivering at times, Axillary temp done ( pt had po drinks earlier) 103.0  Cool blankets applied .  Report given .to incoming RN . Will notify MD

## 2014-05-08 NOTE — Progress Notes (Signed)
Pt ;s present at pt's room . Agreed to have ID procedure done today . Contacted IR radiology but no response Pt kept NPO as m ordered .  Dr Robb Matar spoken with spouse.   Radiology PA spoken with wife. 1200 neuro surgeon spoken with wife . Plan of care discussed . No procedure to be done today .

## 2014-05-08 NOTE — Progress Notes (Signed)
Patient has temperature of 100.6.Marland KitchenMarland Kitchen Notified on-call MD.  Will give tylenol.

## 2014-05-08 NOTE — Progress Notes (Signed)
[facility] PROGRESS NOTE Interim History: 73 y.o. M who underwent back surgery 01/04/14, complicated by hardware infection with MRSA, VRE, and proteus bacteremia. He was initially on [facility], then d/c'd to SNF on 4/29. Hardware was removed and he was readmitted to [facility] on 5/11. Course further complicated by Klebsiella PNA on sputum culture. On 6/10, was transferred to CIR then 6/15 developed new fever, altered mental status, lethargy. PCCM was consulted   Assessment/Plan: Acute resp failure with hypoxia: due to [facility]. Mild diastolic heart failure contributing along with COPD: - Continue antibiotics as dictated/recommended by ID;  - Will continue flutter valve/IS  - Continue pulmicort and PRN xopenex  - Continue low dose lasix.  Fever due to abscess, measuring 2 x 5cm on L4-L5. - WOC oncsult. Currently on Vanc. and meropenem. - Pt has started spiking fever. - Appreciate ID assistance. - I have consult Orthopedics Dr handy which has refused to see the patient. - Recommendation to continue current abx's therapy.  - Blood cx's has been repeated on 6/22 and have remained negative. - MRI of spine as below. Consult IR for abscess aspirate. - He is on prednisone due to his RA.  COPD/Emphysema: currently: - No wheezing  - Per pulmonary service plan is to continue bronchial hygiene, pulmicort and PRN bronchodilators (xopenex given A. 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 - Continue heparin drip and coumadin.  - Cardiology consulted and recommended bisoprolol and HR stable.  RLE DVT:  - transition to Lovenox will hold for aspirate, start coumadin. - Eventually will benefit of xarelto therapy; will follow cardiology rec's on that   Urinary retention: - most likely associated with  BPH  - Started on flomax approx 5 days ago  - voiding trials were successful and foley has been removed.  GERD: continue PPI   RA: continue prednisone   acute toxic encephalopathy:  - due to infection  -now resolved  -patient AAOX3.   Dysphagia/deconditioning:   -As per SPT rec's will continue nectar thick liquids and heart healthy diet.  - Patient to be discharge to SNF vs LTAC for rehabilitation once medically stable   Severe protein calorie malnutrition: will follow nutritional service rec's    Code Status: full  Family Communication: no family at bedside  Disposition Plan: SNF    Consultants:  PCCM  cardiology  Procedures: - 6/10 Doppler legs >> partial acute DVT Rt common femoral vein  - 6/16 Echo >> mild LVH, EF 65 to 70%, grade 3 diastolic dysfx, mild AS, mild/mod MS, mod LA dilation  - 6/16 CT chest >> no PE, atherosclerosis, emphysema with interstitial fibrosis, small pericardial effusions, Rt base consolidation, foci of calcification in pericardium  CULTURES:  - Blood 4/1 >>> neg  - Blood 5/26 >>> neg  - Blood ? Date >>> POS MRSA, VRE, PROTEUS.  - Sputum ? Date >>> POS KLEBSIELLA.  - Blood 6/15 >>>negative  - Urine 6/15 >>> negative  ANTIBIOTICS:  Cefepime ? >>> 5/15  vanc 6.25.2015>> Daptomycin >>> completed treatment 6/23 per ID)   Antibiotics: daptomycin and meropenem completed treatment 6/23) vanc and meropenm 6.24.2015  HPI/Subjective: Weak, no further complains  Objective: Filed Vitals:   05/07/14 2001 05/07/14 2124 05/08/14 0636 05/08/14 0859  BP:  115/77 111/74   Pulse: 70  74 101   Temp:  97.4 F (36.3 C) 100.6 F (38.1 C)   TempSrc:  Axillary Oral   Resp: 18 16 17    Height:      Weight:   83.8 kg (184 lb 11.9 oz)   SpO2: 98% 95% 95% 96%    Intake/Output Summary (Last 24 hours) at 05/08/14 0933 Last data filed at 05/07/14 1805  Gross per 24 hour  Intake    120 ml  Output    700 ml  Net   -580 ml   Filed Weights    05/06/14 0538 05/07/14 0555 05/08/14 0636  Weight: 83.7 kg (184 lb 8.4 oz) 82.89 kg (182 lb 11.8 oz) 83.8 kg (184 lb 11.9 oz)    Exam:  General: in no acute distress.  HEENT: No bruits, no goiter.  Heart: Regular rate and rhythm, without murmurs, rubs, gallops.  Lungs: Good air movement, clear Abdomen: Soft, nontender.  Data Reviewed: Basic Metabolic Panel:  Recent Labs Lab 05/03/14 0250 05/05/14 1205 05/06/14 0355 05/07/14 0507 05/08/14 0459  NA 135* 135* 133* 138 140  K 3.6* 3.4* 3.5* 3.9 4.4  CL 95* 92* 91* 94* 95*  CO2 29 35* 32 37* 36*  GLUCOSE 111* 133* 83 101* 88  BUN 9 9 7 14 13   CREATININE 0.70 0.66 0.52 0.54 0.55  CALCIUM 8.9 8.9 8.9 9.7 9.7   Liver Function Tests: No results found for this basename: AST, ALT, ALKPHOS, BILITOT, PROT, ALBUMIN,  in the last 168 hours No results found for this basename: LIPASE, AMYLASE,  in the last 168 hours No results found for this basename: AMMONIA,  in the last 168 hours CBC:  Recent Labs Lab 05/04/14 0406 05/05/14 0915 05/05/14 1205 05/06/14 0355 05/07/14 0507 05/08/14 0459  WBC 7.1 7.8  --  6.5 6.8 9.7  NEUTROABS  --   --  6.9  --   --   --   HGB 7.5* 8.0*  --  7.3* 7.6* 8.3*  HCT 23.8* 25.8*  --  23.5* 24.9* 27.2*  MCV 84.4 84.6  --  84.8 86.5 88.3  PLT 188 176  --  170 171 266   Cardiac Enzymes:  Recent Labs Lab 05/03/14 0250  CKTOTAL 54   BNP (last 3 results)  Recent Labs  02/06/14 0605 02/15/14 0640 04/30/14 0953  PROBNP 2426.0* 1292.0* 1579.0*   CBG:  Recent Labs Lab 05/07/14 0554 05/07/14 1116 05/07/14 1623 05/07/14 2104 05/08/14 0609  GLUCAP 85 104* 145* 119* 95    Recent Results (from the past 240 hour(s))  CULTURE, BLOOD (ROUTINE X 2)     Status: None   Collection Time    05/03/14  3:01 PM      Result Value Ref Range Status   Specimen Description BLOOD LEFT ARM   Final   Special Requests BOTTLES DRAWN AEROBIC AND ANAEROBIC 10CC   Final   Culture  Setup Time     Final    Value: 05/03/2014 18:56     Performed at 05/05/14   Culture     Final   Value:        BLOOD CULTURE RECEIVED NO GROWTH TO DATE CULTURE WILL BE HELD FOR 5 DAYS BEFORE ISSUING A FINAL NEGATIVE REPORT     Performed at 05/05/2014   Report Status PENDING   Incomplete  CULTURE, BLOOD (ROUTINE X 2)     Status: None   Collection Time    05/03/14  3:14 PM  Result Value Ref Range Status   Specimen Description BLOOD LEFT HAND   Final   Special Requests BOTTLES DRAWN AEROBIC ONLY 3CC   Final   Culture  Setup Time     Final   Value: 05/03/2014 18:58     Performed at [facility]   Culture     Final   Value:        BLOOD CULTURE RECEIVED NO GROWTH TO DATE CULTURE WILL BE HELD FOR 5 DAYS BEFORE ISSUING A FINAL NEGATIVE REPORT     Performed at [facility]   Report Status PENDING   Incomplete  URINE CULTURE     Status: None   Collection Time    05/04/14 10:02 PM      Result Value Ref Range Status   Specimen Description URINE, CLEAN CATCH   Final   Special Requests NONE   Final   Culture  Setup Time     Final   Value: 05/05/2014 04:27     Performed at [facility] Count     Final   Value: NO GROWTH     Performed at [facility]   Culture     Final   Value: NO GROWTH     Performed at [facility]   Report Status 05/06/2014 FINAL   Final  CATH TIP CULTURE     Status: None   Collection Time    05/05/14  2:18 PM      Result Value Ref Range Status   Specimen Description CATH TIP   Final   Special Requests NONE   Final   Culture     Final   Value: NO GROWTH 2 DAYS     Performed at [facility]   Report Status 05/07/2014 FINAL   Final  CULTURE, BLOOD (ROUTINE X 2)     Status: None   Collection Time    05/06/14 12:05 PM      Result Value Ref Range Status   Specimen Description BLOOD RIGHT HAND   Final   Special Requests BOTTLES DRAWN AEROBIC ONLY 6CC   Final   Culture  Setup Time     Final   Value:  05/06/2014 18:53     Performed at [facility]   Culture     Final   Value:        BLOOD CULTURE RECEIVED NO GROWTH TO DATE CULTURE WILL BE HELD FOR 5 DAYS BEFORE ISSUING A FINAL NEGATIVE REPORT     Performed at [facility]   Report Status PENDING   Incomplete  CULTURE, BLOOD (ROUTINE X 2)     Status: None   Collection Time    05/06/14 12:15 PM      Result Value Ref Range Status   Specimen Description BLOOD RIGHT FOREARM   Final   Special Requests BOTTLES DRAWN AEROBIC ONLY [facility]   Final   Culture  Setup Time     Final   Value: 05/06/2014 18:54     Performed at [facility]   Culture     Final   Value:        BLOOD CULTURE RECEIVED NO GROWTH TO DATE CULTURE WILL BE HELD FOR 5 DAYS BEFORE ISSUING A FINAL NEGATIVE REPORT     Performed at [facility]   Report Status PENDING   Incomplete     Studies: Mr Lumbar Spine W Wo Contrast  05/07/2014   CLINICAL DATA:  Wound on the back. Infection following spinal fusion attempt.  EXAM: MRI LUMBAR SPINE WITHOUT AND WITH CONTRAST  TECHNIQUE: Multiplanar and multiecho pulse sequences of the lumbar spine were obtained without and with intravenous contrast.  CONTRAST:  46mL MULTIHANCE GADOBENATE DIMEGLUMINE 529 MG/ML IV SOLN  COMPARISON:  Radiographs 03/17/2014. MRI 11/10/2013. Intraoperative fluoroscopy 01/04/2014.  FINDINGS: Segmentation: Numbering used on prior exam is preserved with 5 lumbar type vertebral bodies.  Alignment: Straightening of the normal lumbar lordosis with slight reversal centered around L2-L3. Grade I retrolisthesis of L4 on L5 measuring 4 mm.  Vertebrae: Discitis/osteomyelitis is present at L3-L4, with endplate edema and enhancement.There is irregularity the endplates which is new compared to the prior exam from 2014 compatible osteolysis from disc space infection. The L3-L4 disc appears liquified.  Postsurgical changes are present at L4-L5 and L5-S1. No compression fracture.  Screw tracts are present in  the L4 through S1 vertebrae compatible with removed rod and pedicle screw hardware.  Conus medullaris: Terminates at L1-L2, normal. Unchanged from prior.  Paraspinal tissues: Paraspinal muscular edema is most pronounced around the L3-L4 level but extends into both iliopsoas muscles. There is chronic LEFT psoas muscle atrophy. No psoas muscle abscess is identified. There is a RIGHT eccentric fluid collection dorsal to the laminectomies at L4 and L5 probably representing abscess. This measures 5.3 cm transverse by 1.8 cm AP and extends up to the posterior aspect of the thecal sac. On post gadolinium imaging, this demonstrates irregular peripheral enhancement, most suggestive of abscess. Areas of susceptibility artifact are present at the periphery. A spinal leak or seroma is considered less likely.  There are several areas of right-sided subcutaneous ulceration with cellulitis. Wound dressing material appears present in the dorsal back. On the sagittal post gadolinium imaging, the right-sided fluid collection appears to communicate with the superficial ulcerations although this is incompletely imaged (image 3 series 9).  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  Disc levels:  L1-L2: Moderate central stenosis associated with a central disc extrusion with cranial extension. There is central thickening of the posterior longitudinal ligament that extends to the L2-L3 disc space. This probably represents an old disc extrusion anterior to the posterior longitudinal ligament. The spinal canal is congenitally narrow. No change from prior. Foramina patent.  L2-L3: Disc degeneration and desiccation with loss of height. Broad-based posterior disc bulging. Mild central stenosis. Foramina appear adequately patent.  L3-L4: Liquefaction of the disc. Discitis/ osteomyelitis. Enhancing granulation tissue extends deep to the posterior longitudinal ligament dorsal to L4 from the disc space. No discrete epidural abscess. L3  laminectomy with recurrent moderate central stenosis. Moderate RIGHT and mild LEFT foraminal stenosis. Bilateral lateral recess stenosis.  L4-L5: Laminectomy. Central canal is decompressed. Discectomy with interbody bone graft. Enhancing granulation tissue present in the epidural space and around both neural foramina. RIGHT eccentric previously mentioned posterior fluid collection extending inferiorly.  L5-S1: Discectomy and laminectomy. Central canal and lateral recesses decompressed. Moderate bilateral foraminal stenosis. Epidural granulation tissue.  IMPRESSION: 1. Subcutaneous ulceration in the dorsal soft tissues of the back eccentric to the RIGHT. Deep fluid collection compatible with abscess in the laminectomy bed at L4-L5 with tract communicating with the superficial ulcerations to the RIGHT of midline. Tract is best visualized on sagittal image number 3 series 9. 2. Development of discitis/osteomyelitis at L3-L4 with moderate central stenosis. No definite epidural abscess. 3. L4-L5 and L5-S1 laminectomy with remove rod and pedicle screw hardware.   Electronically Signed   By: Charolette Child.D.  On: 05/07/2014 11:44    Scheduled Meds: . antiseptic oral rinse  15 mL Mouth Rinse q12n4p  . aspirin EC  81 mg Oral Daily  . bisoprolol  2.5 mg Oral Daily  . budesonide (PULMICORT) nebulizer solution  0.25 mg Nebulization BID  . collagenase   Topical Daily  . enoxaparin (LOVENOX) injection  80 mg Subcutaneous Q12H  . feeding supplement (PRO-STAT SUGAR FREE 64)  30 mL Oral TID WC  . feeding supplement (RESOURCE BREEZE)  1 Container Oral Q24H  . folic acid  1 mg Oral Daily  . furosemide  20 mg Oral BID  . insulin aspart  0-15 Units Subcutaneous TID WC  . lactobacillus acidophilus  1 tablet Oral BID  . lidocaine  1 patch Transdermal Daily  . meropenem (MERREM) IV  1 g Intravenous 3 times per day  . mirtazapine  15 mg Oral QHS  . multivitamin with minerals  1 tablet Oral Daily  . nutrition  supplement (JUVEN)  1 packet Oral BID BM  . pantoprazole  40 mg Oral Q1200  . predniSONE  10 mg Oral Q breakfast  . pregabalin  100 mg Oral TID  . senna  1 tablet Oral QHS  . sodium chloride  10-40 mL Intracatheter Q12H  . tamsulosin  0.4 mg Oral Daily  . vancomycin  750 mg Intravenous Q12H  . vitamin C  250 mg Oral TID WC  . warfarin  5 mg Oral ONCE-1800  . warfarin   Does not apply Once  . Warfarin - Pharmacist Dosing Inpatient   Does not apply q1800   Continuous Infusions: . sodium chloride 10 mL/hr at 05/07/14 2239     Marinda Elk  [facility] Pager [phone]. If 8PM-8AM, please contact night-coverage at [email or web address], password [facility] 05/08/2014, 9:33 AM  LOS: 12 days    **Disclaimer: This note may have been dictated with voice recognition software. Similar sounding words can inadvertently be transcribed and this note may contain transcription errors which may not have been corrected upon publication of note.**

## 2014-05-08 NOTE — Progress Notes (Signed)
ANTIBIOTIC CONSULT NOTE - FOLLOW UP  Pharmacy Consult for Vancomcyin and Merrem Indication: Presumed PNA  No Known Allergies  Patient Measurements: Height: 5\' 8"  (172.7 cm) Weight: 184 lb 11.9 oz (83.8 kg) IBW/kg (Calculated) : 68.4 Adjusted Body Weight:   Vital Signs: Temp: 102.8 F (39.3 C) (06/27 1947) Temp src: Rectal (06/27 1947) BP: 111/82 mmHg (06/27 1940) Pulse Rate: 101 (06/27 2019) Intake/Output from previous day: 06/26 0701 - 06/27 0700 In: 240 [P.O.:240] Out: 700 [Urine:700] Intake/Output from this shift:    Labs:  Recent Labs  05/06/14 0355 05/07/14 0507 05/08/14 0459  WBC 6.5 6.8 9.7  HGB 7.3* 7.6* 8.3*  PLT 170 171 266  CREATININE 0.52 0.54 0.55   Estimated Creatinine Clearance: 89.4 ml/min (by C-G formula based on Cr of 0.55).  Recent Labs  05/06/14 1205 05/08/14 2050  VANCOTROUGH 29.4* 23.1*     Microbiology: Recent Results (from the past 720 hour(s))  MRSA PCR SCREENING     Status: None   Collection Time    04/22/14  9:44 AM      Result Value Ref Range Status   MRSA by PCR NEGATIVE  NEGATIVE Final   Comment:            The GeneXpert MRSA Assay (FDA     approved for NASAL specimens     only), is one component of a     comprehensive MRSA colonization     surveillance program. It is not     intended to diagnose MRSA     infection nor to guide or     monitor treatment for     MRSA infections.  CULTURE, BLOOD (ROUTINE X 2)     Status: None   Collection Time    04/25/14  6:05 PM      Result Value Ref Range Status   Specimen Description BLOOD LEFT ARM   Final   Special Requests BOTTLES DRAWN AEROBIC ONLY 10CC   Final   Culture  Setup Time     Final   Value: 04/26/2014 00:46     Performed at 04/28/2014   Culture     Final   Value: NO GROWTH 5 DAYS     Performed at [facility]   Report Status 05/02/2014 FINAL   Final  URINE CULTURE     Status: None   Collection Time    04/25/14  6:12 PM      Result Value Ref  Range Status   Specimen Description URINE, CLEAN CATCH   Final   Special Requests NONE   Final   Culture  Setup Time     Final   Value: 04/25/2014 18:51     Performed at 04/27/2014 Count     Final   Value: NO GROWTH     Performed at [facility]   Culture     Final   Value: NO GROWTH     Performed at [facility]   Report Status 04/26/2014 FINAL   Final  CULTURE, BLOOD (ROUTINE X 2)     Status: None   Collection Time    04/25/14  6:12 PM      Result Value Ref Range Status   Specimen Description BLOOD LEFT HAND   Final   Special Requests BOTTLES DRAWN AEROBIC ONLY 10CC   Final   Culture  Setup Time     Final   Value: 04/26/2014 00:46     Performed  at [facility]   Culture     Final   Value: NO GROWTH 5 DAYS     Performed at [facility]   Report Status 05/02/2014 FINAL   Final  URINE CULTURE     Status: None   Collection Time    04/26/14 10:30 PM      Result Value Ref Range Status   Specimen Description URINE, CLEAN CATCH   Final   Special Requests NONE   Final   Culture  Setup Time     Final   Value: 04/27/2014 05:27     Performed at [facility] Count     Final   Value: NO GROWTH     Performed at [facility]   Culture     Final   Value: NO GROWTH     Performed at [facility]   Report Status 04/28/2014 FINAL   Final  CULTURE, BLOOD (ROUTINE X 2)     Status: None   Collection Time    04/26/14 11:45 PM      Result Value Ref Range Status   Specimen Description BLOOD LEFT ARM   Final   Special Requests BOTTLES DRAWN AEROBIC ONLY 2CC   Final   Culture  Setup Time     Final   Value: 04/27/2014 04:31     Performed at [facility]   Culture     Final   Value: NO GROWTH 5 DAYS     Performed at [facility]   Report Status 05/03/2014 FINAL   Final  CULTURE, BLOOD (ROUTINE X 2)     Status: None   Collection Time    04/27/14 12:00 AM      Result Value Ref Range Status    Specimen Description BLOOD LEFT HAND   Final   Special Requests BOTTLES DRAWN AEROBIC ONLY 1CC   Final   Culture  Setup Time     Final   Value: 04/27/2014 04:29     Performed at [facility]   Culture     Final   Value: NO GROWTH 5 DAYS     Performed at [facility]   Report Status 05/03/2014 FINAL   Final  CULTURE, BLOOD (ROUTINE X 2)     Status: None   Collection Time    05/03/14  3:01 PM      Result Value Ref Range Status   Specimen Description BLOOD LEFT ARM   Final   Special Requests BOTTLES DRAWN AEROBIC AND ANAEROBIC 10CC   Final   Culture  Setup Time     Final   Value: 05/03/2014 18:56     Performed at [facility]   Culture     Final   Value:        BLOOD CULTURE RECEIVED NO GROWTH TO DATE CULTURE WILL BE HELD FOR 5 DAYS BEFORE ISSUING A FINAL NEGATIVE REPORT     Performed at [facility]   Report Status PENDING   Incomplete  CULTURE, BLOOD (ROUTINE X 2)     Status: None   Collection Time    05/03/14  3:14 PM      Result Value Ref Range Status   Specimen Description BLOOD LEFT HAND   Final   Special Requests BOTTLES DRAWN AEROBIC ONLY 3CC   Final   Culture  Setup Time     Final   Value: 05/03/2014 18:58     Performed  at [facility]   Culture     Final   Value:        BLOOD CULTURE RECEIVED NO GROWTH TO DATE CULTURE WILL BE HELD FOR 5 DAYS BEFORE ISSUING A FINAL NEGATIVE REPORT     Performed at [facility]   Report Status PENDING   Incomplete  URINE CULTURE     Status: None   Collection Time    05/04/14 10:02 PM      Result Value Ref Range Status   Specimen Description URINE, CLEAN CATCH   Final   Special Requests NONE   Final   Culture  Setup Time     Final   Value: 05/05/2014 04:27     Performed at [facility] Count     Final   Value: NO GROWTH     Performed at [facility]   Culture     Final   Value: NO GROWTH     Performed at [facility]   Report Status 05/06/2014  FINAL   Final  CATH TIP CULTURE     Status: None   Collection Time    05/05/14  2:18 PM      Result Value Ref Range Status   Specimen Description CATH TIP   Final   Special Requests NONE   Final   Culture     Final   Value: NO GROWTH 2 DAYS     Performed at [facility]   Report Status 05/07/2014 FINAL   Final  CULTURE, BLOOD (ROUTINE X 2)     Status: None   Collection Time    05/06/14 12:05 PM      Result Value Ref Range Status   Specimen Description BLOOD RIGHT HAND   Final   Special Requests BOTTLES DRAWN AEROBIC ONLY 6CC   Final   Culture  Setup Time     Final   Value: 05/06/2014 18:53     Performed at [facility]   Culture     Final   Value:        BLOOD CULTURE RECEIVED NO GROWTH TO DATE CULTURE WILL BE HELD FOR 5 DAYS BEFORE ISSUING A FINAL NEGATIVE REPORT     Performed at [facility]   Report Status PENDING   Incomplete  CULTURE, BLOOD (ROUTINE X 2)     Status: None   Collection Time    05/06/14 12:15 PM      Result Value Ref Range Status   Specimen Description BLOOD RIGHT FOREARM   Final   Special Requests BOTTLES DRAWN AEROBIC ONLY [facility]   Final   Culture  Setup Time     Final   Value: 05/06/2014 18:54     Performed at [facility]   Culture     Final   Value:        BLOOD CULTURE RECEIVED NO GROWTH TO DATE CULTURE WILL BE HELD FOR 5 DAYS BEFORE ISSUING A FINAL NEGATIVE REPORT     Performed at [facility]   Report Status PENDING   Incomplete    Anti-infectives   Start     Dose/Rate Route Frequency Ordered Stop   05/08/14 2100  vancomycin (VANCOCIN) IVPB 750 mg/150 ml premix     750 mg 150 mL/hr over 60 Minutes Intravenous Every 12 hours 05/08/14 1012     05/06/14 1700  vancomycin (VANCOCIN) IVPB 750 mg/150 ml premix  Status:  Discontinued  750 mg 150 mL/hr over 60 Minutes Intravenous Every 12 hours 05/06/14 1354 05/08/14 1012   05/04/14 2000  vancomycin (VANCOCIN) IVPB 1000 mg/200 mL premix  Status:  Discontinued      1,000 mg 200 mL/hr over 60 Minutes Intravenous Every 8 hours 05/04/14 1102 05/06/14 1326   05/04/14 1400  meropenem (MERREM) 1 g in sodium chloride 0.9 % 100 mL IVPB     1 g 200 mL/hr over 30 Minutes Intravenous 3 times per day 05/04/14 1216     05/04/14 1130  vancomycin (VANCOCIN) 1,250 mg in sodium chloride 0.9 % 250 mL IVPB     1,250 mg 166.7 mL/hr over 90 Minutes Intravenous  Once 05/04/14 1102 05/04/14 1300   04/27/14 1200  DAPTOmycin (CUBICIN) 500 mg in sodium chloride 0.9 % IVPB  Status:  Discontinued     500 mg 220 mL/hr over 30 Minutes Intravenous Every 24 hours 04/26/14 2227 05/04/14 1053   04/26/14 2230  meropenem (MERREM) 1 g in sodium chloride 0.9 % 100 mL IVPB     1 g 200 mL/hr over 30 Minutes Intravenous 3 times per day 04/26/14 2227 05/04/14 2359      Assessment: 72yom on Vancomycin (Day 5) and Merrem for probably PNA. Vancomycin trough is supratherapeutic at 23.1. Will delay next dose and change dosing regimen. ID recommends to complete 7 days of therapy.  - Tmax 102.8, WBC wnl - Scr 0.55 (stable)  Goal of Therapy:  Vancomycin trough level 15-20 mcg/ml  Plan:  1. Change Vancomycin to 1.5g IV q24h x 2 doses to complete 7 days of therapy per ID recs 2. Continue Merrem 1g IV q8h 3. Monitor clinical status, cultures, clinical course and renal function  Cleon Dew [phone] 05/08/2014,10:12 PM

## 2014-05-08 NOTE — Progress Notes (Signed)
Peripherally Inserted Central Catheter/Midline Placement  The IV Nurse has discussed with the patient and/or persons authorized to consent for the patient, the purpose of this procedure and the potential benefits and risks involved with this procedure.  The benefits include less needle sticks, lab draws from the catheter and patient may be discharged home with the catheter.  Risks include, but not limited to, infection, bleeding, blood clot (thrombus formation), and puncture of an artery; nerve damage and irregular heat beat.  Alternatives to this procedure were also discussed.  PICC/Midline Placement Documentation  PICC / Midline Single Lumen 05/08/14 PICC Left Cephalic 45 cm 0 cm (Active)  Indication for Insertion or Continuance of Line Home intravenous therapies (PICC only) 05/08/2014  4:32 PM  Exposed Catheter (cm) 0 cm 05/08/2014  4:32 PM  Line Status Flushed;Saline locked;Blood return noted 05/08/2014  4:32 PM  Dressing Change Due 05/15/14 05/08/2014  4:32 PM       Ethelda Chick 05/08/2014, 4:56 PM

## 2014-05-08 NOTE — Consult Note (Addendum)
WOC consult requested for back wound.  WOC team has been following since admission; please refer to previous progress notes on 6/11, 6/12, 6/16, and 6/25 for assessment, measurements, and topical treatment.  Neurosurgeon who performed back surgery at another facility has declined to re-assess this wound  which had dehisced prior to admission to [facility] when he was contacted earlier this month by the rehab PA. Wound has continued to have large amt yellow drainage and significant amt nonviable tissue and tunneling above spinal bone which is palpable.  Santyl has been used for chemical debridement and packing to absorb drainage without improvement. Air mattress ordered to reduce pressure.  MRI now indicates fluid abscess collection and osteomyelitis.  These complex medical problems are beyond [facility] scope of practice. Please consult a neurosurgeon at [facility] for further plan of care. Cammie Mcgee MSN, RN, CWOCN, [place], CNS [phone]

## 2014-05-09 DIAGNOSIS — R7881 Bacteremia: Secondary | ICD-10-CM

## 2014-05-09 DIAGNOSIS — M519 Unspecified thoracic, thoracolumbar and lumbosacral intervertebral disc disorder: Secondary | ICD-10-CM

## 2014-05-09 DIAGNOSIS — G061 Intraspinal abscess and granuloma: Secondary | ICD-10-CM

## 2014-05-09 DIAGNOSIS — M549 Dorsalgia, unspecified: Secondary | ICD-10-CM

## 2014-05-09 LAB — CBC
HCT: 24.9 % — ABNORMAL LOW (ref 39.0–52.0)
Hemoglobin: 7.5 g/dL — ABNORMAL LOW (ref 13.0–17.0)
MCH: 26.5 pg (ref 26.0–34.0)
MCHC: 30.1 g/dL (ref 30.0–36.0)
MCV: 88 fL (ref 78.0–100.0)
PLATELETS: 230 10*3/uL (ref 150–400)
RBC: 2.83 MIL/uL — ABNORMAL LOW (ref 4.22–5.81)
RDW: 18 % — AB (ref 11.5–15.5)
WBC: 10.8 10*3/uL — AB (ref 4.0–10.5)

## 2014-05-09 LAB — BLOOD GAS, ARTERIAL
Acid-Base Excess: 12.7 mmol/L — ABNORMAL HIGH (ref 0.0–2.0)
Bicarbonate: 37.8 mEq/L — ABNORMAL HIGH (ref 20.0–24.0)
Drawn by: 10552
FIO2: 0.32 %
O2 Saturation: 98.8 %
PATIENT TEMPERATURE: 98.6
PCO2 ART: 59.4 mmHg — AB (ref 35.0–45.0)
PH ART: 7.42 (ref 7.350–7.450)
TCO2: 39.6 mmol/L (ref 0–100)
pO2, Arterial: 126 mmHg — ABNORMAL HIGH (ref 80.0–100.0)

## 2014-05-09 LAB — BASIC METABOLIC PANEL
BUN: 14 mg/dL (ref 6–23)
CALCIUM: 9 mg/dL (ref 8.4–10.5)
CO2: 39 mEq/L — ABNORMAL HIGH (ref 19–32)
Chloride: 94 mEq/L — ABNORMAL LOW (ref 96–112)
Creatinine, Ser: 0.56 mg/dL (ref 0.50–1.35)
Glucose, Bld: 104 mg/dL — ABNORMAL HIGH (ref 70–99)
Potassium: 4 mEq/L (ref 3.7–5.3)
Sodium: 141 mEq/L (ref 137–147)

## 2014-05-09 LAB — LACTIC ACID, PLASMA: LACTIC ACID, VENOUS: 0.8 mmol/L (ref 0.5–2.2)

## 2014-05-09 LAB — PROTIME-INR
INR: 3.41 — ABNORMAL HIGH (ref 0.00–1.49)
INR: 2.26 — ABNORMAL HIGH (ref 0.00–1.49)
PROTHROMBIN TIME: 25 s — AB (ref 11.6–15.2)
Prothrombin Time: 34.4 seconds — ABNORMAL HIGH (ref 11.6–15.2)

## 2014-05-09 LAB — GLUCOSE, CAPILLARY
GLUCOSE-CAPILLARY: 85 mg/dL (ref 70–99)
GLUCOSE-CAPILLARY: 115 mg/dL — AB (ref 70–99)
Glucose-Capillary: 111 mg/dL — ABNORMAL HIGH (ref 70–99)

## 2014-05-09 LAB — CULTURE, BLOOD (ROUTINE X 2)
CULTURE: NO GROWTH
Culture: NO GROWTH

## 2014-05-09 LAB — MRSA PCR SCREENING: MRSA BY PCR: NEGATIVE

## 2014-05-09 MED ORDER — MIRTAZAPINE 7.5 MG PO TABS
7.5000 mg | ORAL_TABLET | Freq: Every day | ORAL | Status: DC
  Administered 2014-05-10 – 2014-05-11 (×2): 7.5 mg via ORAL
  Filled 2014-05-09 (×4): qty 1

## 2014-05-09 MED ORDER — PHYTONADIONE 5 MG PO TABS
2.5000 mg | ORAL_TABLET | Freq: Once | ORAL | Status: AC
  Administered 2014-05-09: 2.5 mg via ORAL
  Filled 2014-05-09: qty 1

## 2014-05-09 MED ORDER — SODIUM CHLORIDE 0.9 % IV SOLN
500.0000 mg | INTRAVENOUS | Status: DC
  Administered 2014-05-09 – 2014-05-12 (×4): 500 mg via INTRAVENOUS
  Filled 2014-05-09 (×6): qty 10

## 2014-05-09 MED ORDER — FLUCONAZOLE 100MG IVPB
100.0000 mg | INTRAVENOUS | Status: DC
  Administered 2014-05-09: 100 mg via INTRAVENOUS
  Filled 2014-05-09 (×2): qty 50

## 2014-05-09 MED ORDER — SODIUM CHLORIDE 0.9 % IV SOLN: 250.0000 mL | INTRAVENOUS | Status: DC | PRN

## 2014-05-09 NOTE — Progress Notes (Signed)
Patient OT:Jon Lozano      DOB: 06-19-1941      MBT:[id number]  Patient moved to SDU. Spouse and daughter at bedside.  Wife reports exhausted.  They were not aware that we had been consulted ,but graciously accepted a GOC appointment at 1 pm tomorrow.  Jon Lozano was going to call to confirm as she needed to check her other schedule  Jon L. Ladona Ridgel, MD MBA The Palliative Medicine Team at [facility] Phone: [phone] Pager: [phone]

## 2014-05-09 NOTE — Progress Notes (Signed)
Patient ID: LARSON RAITT, male   DOB: 07-Jun-1941, 73 y.o.   MRN: [id number]         [facility]    Date of Admission:  04/26/2014   Prolonged antibiotic therapy        Day 13 meropenem        Day 5 vancomycin Active Problems:   Sepsis   Hypotension   Encephalopathy acute   Mitral stenosis   Aortic stenosis   Atrial fibrillation   Pericardial calcification   Diskitis   Osteoporosis, unspecified   . antiseptic oral rinse  15 mL Mouth Rinse q12n4p  . aspirin EC  81 mg Oral Daily  . bisoprolol  2.5 mg Oral Daily  . budesonide (PULMICORT) nebulizer solution  0.25 mg Nebulization BID  . collagenase   Topical Daily  . feeding supplement (PRO-STAT SUGAR FREE 64)  30 mL Oral TID WC  . feeding supplement (RESOURCE BREEZE)  1 Container Oral Q24H  . folic acid  1 mg Oral Daily  . insulin aspart  0-15 Units Subcutaneous TID WC  . lactobacillus acidophilus  1 tablet Oral BID  . lidocaine  1 patch Transdermal Daily  . meropenem (MERREM) IV  1 g Intravenous 3 times per day  . mirtazapine  7.5 mg Oral QHS  . multivitamin with minerals  1 tablet Oral Daily  . nutrition supplement (JUVEN)  1 packet Oral BID BM  . pantoprazole  40 mg Oral Q1200  . predniSONE  10 mg Oral Q breakfast  . pregabalin  100 mg Oral TID  . senna  1 tablet Oral QHS  . sodium chloride  10-40 mL Intracatheter Q12H  . tamsulosin  0.4 mg Oral Daily  . vancomycin  1,500 mg Intravenous Q24H  . vitamin C  250 mg Oral TID WC    Subjective: He is complaining of increasing back pain. No diarrhea reported  Objective: Temp:  [98.1 F (36.7 C)-103 F (39.4 C)] 98.8 F (37.1 C) (06/28 0347) Pulse Rate:  [82-101] 95 (06/28 1213) Resp:  [16-20] 20 (06/28 1213) BP: (92-112)/(63-82) 112/65 mmHg (06/28 1213) SpO2:  [70 %-99 %] 99 % (06/28 1213) Weight:  [84.6 kg (186 lb 8.2 oz)] 84.6 kg (186 lb 8.2 oz) (06/28 0347)  General: Awake. He responds slowly but appropriately to questions Skin: No  rash. Left arm PICC site appears normal Lungs: Clear Cor: Regular S1 and S2 with no murmurs Abdomen: Soft and nontender. No diarrhea Joints extremities: Diffuse right arm swelling. Old PICC site looks good. Multiple covered wounds on the legs. Back: 2 small open wounds from previous lumbar surgeries without drainage or surrounding cellulitis. Larger mid back wound with shaggy yellow base and thin serous drainage. No odor or surrounding cellulitis. He has diffuse redness over his back and buttocks compatible with candidal infection  Lab Results Lab Results  Component Value Date   WBC 10.8* 05/09/2014   HGB 7.5* 05/09/2014   HCT 24.9* 05/09/2014   MCV 88.0 05/09/2014   PLT 230 05/09/2014    Lab Results  Component Value Date   CREATININE 0.56 05/09/2014   BUN 14 05/09/2014   NA 141 05/09/2014   K 4.0 05/09/2014   CL 94* 05/09/2014   CO2 39* 05/09/2014    Lab Results  Component Value Date   ALT 14 04/25/2014   AST 26 04/25/2014   ALKPHOS 152* 04/25/2014   BILITOT 0.8 04/25/2014      Microbiology: Recent Results (from the past  240 hour(s))  CULTURE, BLOOD (ROUTINE X 2)     Status: None   Collection Time    05/03/14  3:01 PM      Result Value Ref Range Status   Specimen Description BLOOD LEFT ARM   Final   Special Requests BOTTLES DRAWN AEROBIC AND ANAEROBIC 10CC   Final   Culture  Setup Time     Final   Value: 05/03/2014 18:56     Performed at [facility]   Culture     Final   Value:        BLOOD CULTURE RECEIVED NO GROWTH TO DATE CULTURE WILL BE HELD FOR 5 DAYS BEFORE ISSUING A FINAL NEGATIVE REPORT     Performed at [facility]   Report Status PENDING   Incomplete  CULTURE, BLOOD (ROUTINE X 2)     Status: None   Collection Time    05/03/14  3:14 PM      Result Value Ref Range Status   Specimen Description BLOOD LEFT HAND   Final   Special Requests BOTTLES DRAWN AEROBIC ONLY 3CC   Final   Culture  Setup Time     Final   Value: 05/03/2014 18:58     Performed at  [facility]   Culture     Final   Value:        BLOOD CULTURE RECEIVED NO GROWTH TO DATE CULTURE WILL BE HELD FOR 5 DAYS BEFORE ISSUING A FINAL NEGATIVE REPORT     Performed at [facility]   Report Status PENDING   Incomplete  URINE CULTURE     Status: None   Collection Time    05/04/14 10:02 PM      Result Value Ref Range Status   Specimen Description URINE, CLEAN CATCH   Final   Special Requests NONE   Final   Culture  Setup Time     Final   Value: 05/05/2014 04:27     Performed at [facility] Count     Final   Value: NO GROWTH     Performed at [facility]   Culture     Final   Value: NO GROWTH     Performed at [facility]   Report Status 05/06/2014 FINAL   Final  CATH TIP CULTURE     Status: None   Collection Time    05/05/14  2:18 PM      Result Value Ref Range Status   Specimen Description CATH TIP   Final   Special Requests NONE   Final   Culture     Final   Value: NO GROWTH 2 DAYS     Performed at [facility]   Report Status 05/07/2014 FINAL   Final  CULTURE, BLOOD (ROUTINE X 2)     Status: None   Collection Time    05/06/14 12:05 PM      Result Value Ref Range Status   Specimen Description BLOOD RIGHT HAND   Final   Special Requests BOTTLES DRAWN AEROBIC ONLY [facility]   Final   Culture  Setup Time     Final   Value: 05/06/2014 18:53     Performed at [facility]   Culture     Final   Value:        BLOOD CULTURE RECEIVED NO GROWTH TO DATE CULTURE WILL BE HELD FOR 5 DAYS BEFORE ISSUING A FINAL NEGATIVE REPORT  Performed at [facility]   Report Status PENDING   Incomplete  CULTURE, BLOOD (ROUTINE X 2)     Status: None   Collection Time    05/06/14 12:15 PM      Result Value Ref Range Status   Specimen Description BLOOD RIGHT FOREARM   Final   Special Requests BOTTLES DRAWN AEROBIC ONLY [facility]   Final   Culture  Setup Time     Final   Value: 05/06/2014 18:54     Performed at [facility]   Culture     Final   Value:        BLOOD CULTURE RECEIVED NO GROWTH TO DATE CULTURE WILL BE HELD FOR 5 DAYS BEFORE ISSUING A FINAL NEGATIVE REPORT     Performed at [facility]   Report Status PENDING   Incomplete    Studies/Results: Dg Chest Port 1 View  05/08/2014   CLINICAL DATA:  Line placement.  EXAM: PORTABLE CHEST - 1 VIEW  COMPARISON:  Chest radiograph 05/02/2014  FINDINGS: Left upper extremity PICC line is present tip projecting over the superior vena cava. Stable cardiomegaly. Probable small right pleural effusion. Minimal heterogeneous opacities right lung base. Minimal heterogeneous opacities left lung base. No definite pneumothorax.  IMPRESSION: Left upper extremity PICC line tip projects over the superior vena cava.  Probable small right pleural effusion and underlying opacities which may represent atelectasis.  Minimal left basilar atelectasis.   Electronically Signed   By: Annia Belt M.D.   On: 05/08/2014 17:09    Assessment: Mr. Deshler has developed no fevers, hypotension and lethargy. I will obtain blood and urine cultures and broaden his antimicrobial therapy. I will switch vancomycin back to daptomycin to provide coverage for VRE again and I will add empiric antifungal coverage.  Plan: 1. Urine and blood cultures 2. Continue meropenem 3. Change vancomycin to daptomycin 4. Start fluconazole  Cliffton Asters, MD [facility] [phone] pager   [phone] cell 05/09/2014, 1:03 PM

## 2014-05-09 NOTE — Progress Notes (Signed)
A Palliative Medicine Consult has been received for Patient Jon Lozano      DOB: 01-18-1941      TMH:[id number].We are attempting to respond as quickly as possible to your patient and families needs, however, due to high volume we have not been able to service you in a more timely manner.   We will get this scheduled as quickly as possible.  If in the meantime we can assist with symptom management or other questions via phone please do not hesitate to call our PMT cell phone [phone].   We are sorry for the delay, Melissa L. Ladona Ridgel, MD MBA The Palliative Medicine Team at [facility] Phone: [phone] Pager: [phone]

## 2014-05-09 NOTE — Consult Note (Signed)
PULMONARY / CRITICAL CARE MEDICINE   Name: Jon Lozano MRN: [id number] DOB: 11/09/1941    ADMISSION DATE:  04/26/2014 CONSULTATION DATE:  05/09/14  REFERRING MD :  Dr. Robb Matar  PRIMARY SERVICE: TRh   CHIEF COMPLAINT:  AMS, Fever, Spinal Abscess   BRIEF PATIENT DESCRIPTION: 73 y/o M with back surgery in 12/2013 that had complication of hardware infection with MRSA, VRE and Proteus bacteremia.  Hardware has since been removed and pt was admitted to [facility] 5/11.  He developed Klebsiella PNA.  6/10 he was transferred to [facility] for rehab efforts.  He later developed fever on 6/15 and was transferred back to [facility].    SIGNIFICANT EVENTS   2/23 - back surgery.  4/29 - d/c'd to SNF.  5/11 - admitted to [facility] (after hardware removal, ? Date).  6/10 - transferred to CIR.  6/15 - developed fever to 102, tachycardia, hypoxia, AMS, lethargy. 6/18 - off pressors, transfer to telemetry 6/28 - Fever up to 103, PCCM re-consulted   STUDIES:  6/10 Doppler legs >> partial acute DVT Rt common femoral vein  6/16 Echo >> mild LVH, EF 65 to 70%, grade 3 diastolic dysfx, mild AS, mild/mod MS, mod LA dilation  6/16 CT chest >> no PE, atherosclerosis, emphysema with interstitial fibrosis, small pericardial effusions, Rt base consolidation, foci of calcification in pericardium 6/26 MRI Spine >>> shows L3-L4 discitis plus deep fluid collection in L4-L5 laminectomy bed that communicates with superficial ulcerations  LINES / TUBES: LUE PICC 6/27 >>  CULTURES:  Hx BC positive for MRSA, VRE, Proteus.  Sputum positive for klebsiella  BCx2 6/22 >>> UC 6/23 >>>neg Cath tip 6/24 >>>neg BCx2 6/25 >>>    ANTIBIOTICS:  PER ID Meropenem 6/15>>> Vanco 6/23 >>>  HISTORY OF PRESENT ILLNESS:  73 y.o. M with a PMH of HTN, Murmur, PVD, AS/MS, Afib, RA on prednisone who underwent back surgery on2/23/15 at [facility], course was complicated by hardware infection with MRSA, VRE, and proteus bacteremia. Patient went to [facility],  then d/c'd to SNF on 4/29. He continued to have ongoing concern for infection & hardware was removed.  He then was readmitted to [facility] on 5/11. Course further complicated by Klebsiella PNA.  On 6/10, was transferred to CIR for rehab efforts then on 6/15 he developed new fever, altered mental status, lethargy. On 6/28 despite antibiotics and ongoing wound therapy, he spiked fevers again to 103 and developed altered mental status.  Patient was given volume and transferred to SDU.  PCCM consulted for evaluation.  Wife has requested to be transfer to [facility] as she wants to see Dr. Loralie Champagne.    PAST MEDICAL HISTORY :  Past Medical History  Diagnosis Date  . Hypertension   . Heart murmur   . Peripheral vascular disease   . Arthritis   . Hepatitis 1954  . Mitral stenosis   . Aortic stenosis   . Atrial fibrillation   . Pericardial calcification    Past Surgical History  Procedure Laterality Date  . Cholecystectomy  1983  . Pericardial fluid drainage  2007  . Cataract extraction  2012  . Tee without cardioversion  10/23/2012    Procedure: TRANSESOPHAGEAL ECHOCARDIOGRAM (TEE);  Surgeon: Thurmon Fair, MD;  Location: [facility] ENDOSCOPY;  Service: Cardiovascular;  Laterality: N/A;  to come in1130 for work up   Prior to Admission medications   Medication Sig Start Date End Date Taking? Authorizing Provider  Amino Acids-Protein Hydrolys (FEEDING SUPPLEMENT, PRO-STAT SUGAR FREE 64,) LIQD Take 30  mLs by mouth 3 (three) times daily with meals.   Yes Historical Provider, MD  aspirin 81 MG tablet Take 81 mg by mouth daily.   Yes Historical Provider, MD  DAPTOmycin in sodium chloride 0.9 % 100 mL Inject 500 mg into the vein daily. 03/13/14  Yes Historical Provider, MD  ferrous sulfate 325 (65 FE) MG tablet Take 325 mg by mouth 3 (three) times daily with meals.   Yes Historical Provider, MD  folic acid (FOLVITE) 1 MG tablet Take 1 mg by mouth daily.   Yes Historical Provider, MD  furosemide (LASIX) 20 MG  tablet Take 20 mg by mouth 2 (two) times daily.   Yes Historical Provider, MD  Guaifenesin (MUCINEX MAXIMUM STRENGTH) 1200 MG TB12 Take 1 tablet by mouth 2 (two) times daily.   Yes Historical Provider, MD  lactobacillus acidophilus (BACID) TABS tablet Take 1 tablet by mouth 2 (two) times daily.    Yes Historical Provider, MD  lidocaine (LIDODERM) 5 % Place 1 patch onto the skin daily. Remove & Discard patch within 12 hours or as directed by MD   Yes Historical Provider, MD  meropenem 1 g in sodium chloride 0.9 % 100 mL Inject 1 g into the vein every 8 (eight) hours. For total 6 weeks 03/13/14  Yes Historical Provider, MD  mirtazapine (REMERON) 15 MG tablet Take 15 mg by mouth at bedtime.   Yes Historical Provider, MD  Multiple Vitamins-Minerals (MULTIVITAMINS THER. W/MINERALS) TABS tablet Take 1 tablet by mouth daily.   Yes Historical Provider, MD  oxycodone (OXY-IR) 5 MG capsule Take 5 mg by mouth 2 (two) times daily.   Yes Historical Provider, MD  pantoprazole (PROTONIX) 40 MG tablet Take 40 mg by mouth daily.   Yes Historical Provider, MD  polyethylene glycol (MIRALAX / GLYCOLAX) packet Take 17 g by mouth 2 (two) times daily.   Yes Historical Provider, MD  potassium chloride SA (K-DUR,KLOR-CON) 20 MEQ tablet Take 40 mEq by mouth daily.   Yes Historical Provider, MD  predniSONE (DELTASONE) 10 MG tablet Take 10 mg by mouth daily with breakfast.   Yes Historical Provider, MD  pregabalin (LYRICA) 100 MG capsule Take 100 mg by mouth 3 (three) times daily.   Yes Historical Provider, MD  senna (SENOKOT) 8.6 MG TABS tablet Take 1 tablet by mouth at bedtime.   Yes Historical Provider, MD  tamsulosin (FLOMAX) 0.4 MG CAPS capsule Take 0.4 mg by mouth daily.   Yes Historical Provider, MD  vitamin C (ASCORBIC ACID) 250 MG tablet Take 250 mg by mouth 3 (three) times daily with meals.   Yes Historical Provider, MD  zinc sulfate 220 MG capsule Take 220 mg by mouth daily.   Yes Historical Provider, MD  clopidogrel  (PLAVIX) 75 MG tablet Take 75 mg by mouth daily.    Historical Provider, MD   No Known Allergies  FAMILY HISTORY:  Family History  Problem Relation Age of Onset  . Heart disease Father    SOCIAL HISTORY:  reports that he has quit smoking. His smoking use included Cigarettes. He smoked 1.00 pack per day. He quit smokeless tobacco use about 2 years ago. He reports that he drinks alcohol. He reports that he does not use illicit drugs.  REVIEW OF SYSTEMS:   Gen: Denies fever, chills, weight change, fatigue, night sweats HEENT: Denies blurred vision, double vision, hearing loss, tinnitus, sinus congestion, rhinorrhea, sore throat, neck stiffness, dysphagia PULM: Denies shortness of breath, cough, sputum production, hemoptysis, wheezing CV:  Denies chest pain, edema, orthopnea, paroxysmal nocturnal dyspnea, palpitations GI: Denies abdominal pain, nausea, vomiting, diarrhea, hematochezia, melena, constipation, change in bowel habits GU: Denies dysuria, hematuria, polyuria, oliguria, urethral discharge Endocrine: Denies hot or cold intolerance, polyuria, polyphagia or appetite change Derm: Denies rash, dry skin, scaling or peeling skin change Heme: Denies easy bruising, bleeding, bleeding gums Neuro: Denies headache, numbness, weakness, slurred speech, loss of memory or consciousness.  Reports back pain - ongoing chronic issues.    SUBJECTIVE:   VITAL SIGNS: Temp:  [98.1 F (36.7 C)-103 F (39.4 C)] 98.8 F (37.1 C) (06/28 0347) Pulse Rate:  [82-101] 90 (06/28 0347) Resp:  [18-19] 19 (06/28 0347) BP: (104-111)/(69-82) 111/82 mmHg (06/27 1940) SpO2:  [70 %-100 %] 97 % (06/28 0913) Weight:  [186 lb 8.2 oz (84.6 kg)] 186 lb 8.2 oz (84.6 kg) (06/28 0347) HEMODYNAMICS:   VENTILATOR SETTINGS:   INTAKE / OUTPUT: Intake/Output     06/27 0701 - 06/28 0700 06/28 0701 - 06/29 0700   P.O. 0    Total Intake(mL/kg)     Urine (mL/kg/hr)     Total Output       Net 0          Urine Occurrence  1 x    Stool Occurrence 1 x      PHYSICAL EXAMINATION: General:  wdwn elderly male in NAD. Very deconditioned Neuro:  Alerrt. Likely oriented. WEak on lowers HEENT:  Mm pink/dry, no jvd Cardiovascular:  s1s2 rrr Lungs:  resp's even/non-labored, lungs clear bilaterally  Abdomen:  Round/soft, bsx4 active  Musculoskeletal:  No acute deformities  Skin:  Warm/dry, no edema.  Spine dressing c/d/i  LABS:  PULMONARY  Recent Labs Lab 05/09/14 0905  PHART 7.420  PCO2ART 59.4*  PO2ART 126.0*  HCO3 37.8*  TCO2 39.6  O2SAT 98.8    CBC  Recent Labs Lab 05/07/14 0507 05/08/14 0459 05/09/14 0451  HGB 7.6* 8.3* 7.5*  HCT 24.9* 27.2* 24.9*  WBC 6.8 9.7 10.8*  PLT 171 266 230    COAGULATION  Recent Labs Lab 05/06/14 0355 05/07/14 0507 05/08/14 0459 05/09/14 0451  INR 1.35 1.32 1.81* 3.41*    CARDIAC  No results found for this basename: TROPONINI,  in the last 168 hours No results found for this basename: PROBNP,  in the last 168 hours   CHEMISTRY  Recent Labs Lab 05/05/14 1205 05/06/14 0355 05/07/14 0507 05/08/14 0459 05/09/14 0451  NA 135* 133* 138 140 141  K 3.4* 3.5* 3.9 4.4 4.0  CL 92* 91* 94* 95* 94*  CO2 35* 32 37* 36* 39*  GLUCOSE 133* 83 101* 88 104*  BUN 9 7 14 13 14   CREATININE 0.66 0.52 0.54 0.55 0.56  CALCIUM 8.9 8.9 9.7 9.7 9.0   Estimated Creatinine Clearance: 89.7 ml/min (by C-G formula based on Cr of 0.56).   LIVER  Recent Labs Lab 05/06/14 0355 05/07/14 0507 05/08/14 0459 05/09/14 0451  INR 1.35 1.32 1.81* 3.41*     INFECTIOUS  Recent Labs Lab 05/09/14 1035  LATICACIDVEN 0.8     ENDOCRINE CBG (last 3)   Recent Labs  05/08/14 2112 05/09/14 0623 05/09/14 1234  GLUCAP 129* 85 111*         IMAGING x48h  Dg Chest Port 1 View  05/08/2014   CLINICAL DATA:  Line placement.  EXAM: PORTABLE CHEST - 1 VIEW  COMPARISON:  Chest radiograph 05/02/2014  FINDINGS: Left upper extremity PICC line is present tip  projecting over the superior vena  cava. Stable cardiomegaly. Probable small right pleural effusion. Minimal heterogeneous opacities right lung base. Minimal heterogeneous opacities left lung base. No definite pneumothorax.  IMPRESSION: Left upper extremity PICC line tip projects over the superior vena cava.  Probable small right pleural effusion and underlying opacities which may represent atelectasis.  Minimal left basilar atelectasis.   Electronically Signed   By: Annia Belt M.D.   On: 05/08/2014 17:09      ASSESSMENT / PLAN:  NEUROLOGIC / ORTHO  A:   L3-L4 Discitis / Osteomyelitis - Status L4-L5, L5-S1 lami 01/04/14 complicated by MRSA, VRE, Bacteremia.  Hardware removed by Dr. Alger Memos at [facility].  S/P 6 weeks abx for osteo (ended 6/23) Pain  P:   RASS goal: n/a Continue abx per ID >> currently on vanco / meropenem NSGY drainage vs IR drainage of fluid collection v palliatioina Pain control as ordered, caution with sedating medications  WOC following, appreciate input  Reduce Remeron to 7.5mg  QHS  Continue oxy IR 5mg  Q6   INFECTIOUS A:   Fever - concerning fever is related to abscess.  No significant infiltrate on CXR, & recent PICC line change.  Spike to 103 6/28.  L3-L4 Discitis / Osteo  HCAP - resolved.  P:   ABX per ID Follow pending cultures   PULMONARY A: Mild Hypercarbia Small R Effusion  Former Tobacco Abuse  P:   Monitor resp status  Oxygen to support sats > 92% Continue pulmicort, PRN xopenex Minimize sedating medications as able  Monitor effusion size / changes  CARDIOVASCULAR A:  HTN HLD PVD MS/AS Atrial Fib P:  Hold bisoprolol x 1 dose Continue current medications NS bolus x 1 ASA  RENAL A:   No acute issues  P:   Trend BMP Replace electrolytes as indicated  GASTROINTESTINAL / GU A:   BPH  GERD Protein Calorie Malnutrition  P:   Continue flomax PPI with steroids Protein supplement to aide in wound healing  Lactobacillus    HEMATOLOGIC A:   Anemia - likely chronic critical illness P:  Monitor CBC Tx for Hgb <7% Folate  ENDOCRINE A:   Steroid Dependent - hx of RA on baseline steroids    P:   Continue current steroids, attempt to wean to lowest dose possible given infection  Monitor glucose on BMP  7/28, NP-C [facility] Pgr: [phone] or [phone] 05/09/2014, 10:08 AM   STAFF NOTE: I, Dr 05/11/2014 have personally reviewed patient's available data, including medical history, events of note, physical examination and test results as part of my evaluation. I have discussed with resident/NP and other care providers such as pharmacist, RN and RRT.  In addition,  I personally evaluated patient and elicited key findings of  Has epidural abscess, Seen by neurosurgery - poor surgical candidate noted. Family earlier in day apparently interested in full medical care and full code. This PM wife and daughter appear to be expressing understanding that he is in hospital long time and frail and they are processing all this and goals of care. No decision yet.  Rest per NP/medical resident whose note is outlined above and that I agree with    Dr. Lavinia Sharps, M.D., [facility].C.P Pulmonary and Critical Care Medicine Staff Physician [facility] Pager: [phone], If no answer or between  15:00h - 7:00h: call [phone]  05/09/2014 4:08 PM

## 2014-05-09 NOTE — Progress Notes (Signed)
ANTIBIOTIC CONSULT NOTE - FOLLOW UP  Pharmacy Consult for Daptomycin and Merrem Indication: Presumed PNA, recent hardware infection, VRE coverage   No Known Allergies  Patient Measurements: Height: 5\' 8"  (172.7 cm) Weight: 186 lb 8.2 oz (84.6 kg) IBW/kg (Calculated) : 68.4   Vital Signs: Temp: 98.8 F (37.1 C) (06/28 0347) Temp src: Oral (06/28 0347) BP: 112/65 mmHg (06/28 1213) Pulse Rate: 95 (06/28 1213) Intake/Output from previous day:   Intake/Output from this shift:    Labs:  Recent Labs  05/07/14 0507 05/08/14 0459 05/09/14 0451  WBC 6.8 9.7 10.8*  HGB 7.6* 8.3* 7.5*  PLT 171 266 230  CREATININE 0.54 0.55 0.56   Estimated Creatinine Clearance: 89.7 ml/min (by C-G formula based on Cr of 0.56).  Recent Labs  05/08/14 2050  VANCOTROUGH 23.1*     Microbiology: Recent Results (from the past 720 hour(s))  MRSA PCR SCREENING     Status: None   Collection Time    04/22/14  9:44 AM      Result Value Ref Range Status   MRSA by PCR NEGATIVE  NEGATIVE Final   Comment:            The GeneXpert MRSA Assay (FDA     approved for NASAL specimens     only), is one component of a     comprehensive MRSA colonization     surveillance program. It is not     intended to diagnose MRSA     infection nor to guide or     monitor treatment for     MRSA infections.  CULTURE, BLOOD (ROUTINE X 2)     Status: None   Collection Time    04/25/14  6:05 PM      Result Value Ref Range Status   Specimen Description BLOOD LEFT ARM   Final   Special Requests BOTTLES DRAWN AEROBIC ONLY 10CC   Final   Culture  Setup Time     Final   Value: 04/26/2014 00:46     Performed at 04/28/2014   Culture     Final   Value: NO GROWTH 5 DAYS     Performed at [facility]   Report Status 05/02/2014 FINAL   Final  URINE CULTURE     Status: None   Collection Time    04/25/14  6:12 PM      Result Value Ref Range Status   Specimen Description URINE, CLEAN CATCH   Final   Special Requests NONE   Final   Culture  Setup Time     Final   Value: 04/25/2014 18:51     Performed at 04/27/2014 Count     Final   Value: NO GROWTH     Performed at [facility]   Culture     Final   Value: NO GROWTH     Performed at [facility]   Report Status 04/26/2014 FINAL   Final  CULTURE, BLOOD (ROUTINE X 2)     Status: None   Collection Time    04/25/14  6:12 PM      Result Value Ref Range Status   Specimen Description BLOOD LEFT HAND   Final   Special Requests BOTTLES DRAWN AEROBIC ONLY 10CC   Final   Culture  Setup Time     Final   Value: 04/26/2014 00:46     Performed at 04/28/2014  Final   Value: NO GROWTH 5 DAYS     Performed at [facility]   Report Status 05/02/2014 FINAL   Final  URINE CULTURE     Status: None   Collection Time    04/26/14 10:30 PM      Result Value Ref Range Status   Specimen Description URINE, CLEAN CATCH   Final   Special Requests NONE   Final   Culture  Setup Time     Final   Value: 04/27/2014 05:27     Performed at [facility] Count     Final   Value: NO GROWTH     Performed at [facility]   Culture     Final   Value: NO GROWTH     Performed at [facility]   Report Status 04/28/2014 FINAL   Final  CULTURE, BLOOD (ROUTINE X 2)     Status: None   Collection Time    04/26/14 11:45 PM      Result Value Ref Range Status   Specimen Description BLOOD LEFT ARM   Final   Special Requests BOTTLES DRAWN AEROBIC ONLY 2CC   Final   Culture  Setup Time     Final   Value: 04/27/2014 04:31     Performed at [facility]   Culture     Final   Value: NO GROWTH 5 DAYS     Performed at [facility]   Report Status 05/03/2014 FINAL   Final  CULTURE, BLOOD (ROUTINE X 2)     Status: None   Collection Time    04/27/14 12:00 AM      Result Value Ref Range Status   Specimen Description BLOOD LEFT HAND   Final   Special Requests  BOTTLES DRAWN AEROBIC ONLY 1CC   Final   Culture  Setup Time     Final   Value: 04/27/2014 04:29     Performed at [facility]   Culture     Final   Value: NO GROWTH 5 DAYS     Performed at [facility]   Report Status 05/03/2014 FINAL   Final  CULTURE, BLOOD (ROUTINE X 2)     Status: None   Collection Time    05/03/14  3:01 PM      Result Value Ref Range Status   Specimen Description BLOOD LEFT ARM   Final   Special Requests BOTTLES DRAWN AEROBIC AND ANAEROBIC 10CC   Final   Culture  Setup Time     Final   Value: 05/03/2014 18:56     Performed at [facility]   Culture     Final   Value: NO GROWTH 5 DAYS     Performed at [facility]   Report Status 05/09/2014 FINAL   Final  CULTURE, BLOOD (ROUTINE X 2)     Status: None   Collection Time    05/03/14  3:14 PM      Result Value Ref Range Status   Specimen Description BLOOD LEFT HAND   Final   Special Requests BOTTLES DRAWN AEROBIC ONLY 3CC   Final   Culture  Setup Time     Final   Value: 05/03/2014 18:58     Performed at [facility]   Culture     Final   Value: NO GROWTH 5 DAYS     Performed at [facility]   Report Status 05/09/2014  FINAL   Final  URINE CULTURE     Status: None   Collection Time    05/04/14 10:02 PM      Result Value Ref Range Status   Specimen Description URINE, CLEAN CATCH   Final   Special Requests NONE   Final   Culture  Setup Time     Final   Value: 05/05/2014 04:27     Performed at [facility]   Colony Count     Final   Value: NO GROWTH     Performed at [facility]   Culture     Final   Value: NO GROWTH     Performed at [facility]   Report Status 05/06/2014 FINAL   Final  CATH TIP CULTURE     Status: None   Collection Time    05/05/14  2:18 PM      Result Value Ref Range Status   Specimen Description CATH TIP   Final   Special Requests NONE   Final   Culture     Final   Value: NO GROWTH 2 DAYS     Performed at  [facility]   Report Status 05/07/2014 FINAL   Final  CULTURE, BLOOD (ROUTINE X 2)     Status: None   Collection Time    05/06/14 12:05 PM      Result Value Ref Range Status   Specimen Description BLOOD RIGHT HAND   Final   Special Requests BOTTLES DRAWN AEROBIC ONLY 6CC   Final   Culture  Setup Time     Final   Value: 05/06/2014 18:53     Performed at [facility]   Culture     Final   Value:        BLOOD CULTURE RECEIVED NO GROWTH TO DATE CULTURE WILL BE HELD FOR 5 DAYS BEFORE ISSUING A FINAL NEGATIVE REPORT     Performed at [facility]   Report Status PENDING   Incomplete  CULTURE, BLOOD (ROUTINE X 2)     Status: None   Collection Time    05/06/14 12:15 PM      Result Value Ref Range Status   Specimen Description BLOOD RIGHT FOREARM   Final   Special Requests BOTTLES DRAWN AEROBIC ONLY 7CC   Final   Culture  Setup Time     Final   Value: 05/06/2014 18:54     Performed at [facility]   Culture     Final   Value:        BLOOD CULTURE RECEIVED NO GROWTH TO DATE CULTURE WILL BE HELD FOR 5 DAYS BEFORE ISSUING A FINAL NEGATIVE REPORT     Performed at [facility]   Report Status PENDING   Incomplete    Anti-infectives   Start     Dose/Rate Route Frequency Ordered Stop   05/09/14 1800  DAPTOmycin (CUBICIN) 500 mg in sodium chloride 0.9 % IVPB     500 mg 220 mL/hr over 30 Minutes Intravenous Every 24 hours 05/09/14 1429     05/09/14 1315  fluconazole (DIFLUCAN) IVPB 100 mg     100 mg 50 mL/hr over 60 Minutes Intravenous Every 24 hours 05/09/14 1311     05/09/14 0400  vancomycin (VANCOCIN) 1,500 mg in sodium chloride 0.9 % 500 mL IVPB  Status:  Discontinued     1,500 mg 250 mL/hr over 120 Minutes Intravenous Every 24 hours 05/08/14 2221 05/09/14 1311  05/08/14 2100  vancomycin (VANCOCIN) IVPB 750 mg/150 ml premix  Status:  Discontinued     750 mg 150 mL/hr over 60 Minutes Intravenous Every 12 hours 05/08/14 1012 05/08/14 2221    05/06/14 1700  vancomycin (VANCOCIN) IVPB 750 mg/150 ml premix  Status:  Discontinued     750 mg 150 mL/hr over 60 Minutes Intravenous Every 12 hours 05/06/14 1354 05/08/14 1012   05/04/14 2000  vancomycin (VANCOCIN) IVPB 1000 mg/200 mL premix  Status:  Discontinued     1,000 mg 200 mL/hr over 60 Minutes Intravenous Every 8 hours 05/04/14 1102 05/06/14 1326   05/04/14 1400  meropenem (MERREM) 1 g in sodium chloride 0.9 % 100 mL IVPB     1 g 200 mL/hr over 30 Minutes Intravenous 3 times per day 05/04/14 1216     05/04/14 1130  vancomycin (VANCOCIN) 1,250 mg in sodium chloride 0.9 % 250 mL IVPB     1,250 mg 166.7 mL/hr over 90 Minutes Intravenous  Once 05/04/14 1102 05/04/14 1300   04/27/14 1200  DAPTOmycin (CUBICIN) 500 mg in sodium chloride 0.9 % IVPB  Status:  Discontinued     500 mg 220 mL/hr over 30 Minutes Intravenous Every 24 hours 04/26/14 2227 05/04/14 1053   04/26/14 2230  meropenem (MERREM) 1 g in sodium chloride 0.9 % 100 mL IVPB     1 g 200 mL/hr over 30 Minutes Intravenous 3 times per day 04/26/14 2227 05/04/14 2359      Assessment: 72yom on Vancomycin (Day 5) and Merrem (Day 13) for probably PNA, diskitis, sepsis. Vancomycin changed to daptomycin per ID recommendations this afternoon for broader coverage including VRE.  - Tmax 103, WBC 10.8 - Scr 0.56 (stable)  5/12 Urine: yeast 5/26, 6/16, 6/22 Blood: NEG 6/15, 6/23 Urine: NEG 6/24 cath tip: NEG 6/22 bld: ng 6/25 blood: ngtd 6/28 urine 6/28 bldx2   Goal of Therapy:  Eradication of infection   Plan:  1. Change Vancomycin to daptomycin 500mg  (~6mg /kg) q 24 hours 2. Continue Merrem 1g IV q8h 3. Monitor clinical status, cultures, clinical course and renal function  PharmD., BCPS Clinical Pharmacist Pager [phone] 05/09/2014 2:32 PM

## 2014-05-09 NOTE — Progress Notes (Signed)
Patient ID: Jon Lozano, male   DOB: [birth year], 73 y.o.   MRN: [id number] Subjective:  The patient has become febrile and septic again. He has been transferred to the ICU. Dr. David Stall has asked me to see the patient again. Dr. Cliffton Asters, infectious disease is evaluating the patient.  Objective: Vital signs in last 24 hours: Temp:  [98.1 F (36.7 C)-103 F (39.4 C)] 98.8 F (37.1 C) (06/28 0347) Pulse Rate:  [82-101] 95 (06/28 1213) Resp:  [16-20] 20 (06/28 1213) BP: (92-112)/(63-82) 112/65 mmHg (06/28 1213) SpO2:  [70 %-99 %] 99 % (06/28 1213) Weight:  [84.6 kg (186 lb 8.2 oz)] 84.6 kg (186 lb 8.2 oz) (06/28 0347)  Intake/Output from previous day:   Intake/Output this shift:    Physical exam the patient again is frail, globally weak, his wound is not healed in the upper lumbar region. Is generally red, he has some purulent discharge  Lab Results:  Recent Labs  05/08/14 0459 05/09/14 0451  WBC 9.7 10.8*  HGB 8.3* 7.5*  HCT 27.2* 24.9*  PLT 266 230   BMET  Recent Labs  05/08/14 0459 05/09/14 0451  NA 140 141  K 4.4 4.0  CL 95* 94*  CO2 36* 39*  GLUCOSE 88 104*  BUN 13 14  CREATININE 0.55 0.56  CALCIUM 9.7 9.0    Studies/Results: Dg Chest Port 1 View  05/08/2014   CLINICAL DATA:  Line placement.  EXAM: PORTABLE CHEST - 1 VIEW  COMPARISON:  Chest radiograph 05/02/2014  FINDINGS: Left upper extremity PICC line is present tip projecting over the superior vena cava. Stable cardiomegaly. Probable small right pleural effusion. Minimal heterogeneous opacities right lung base. Minimal heterogeneous opacities left lung base. No definite pneumothorax.  IMPRESSION: Left upper extremity PICC line tip projects over the superior vena cava.  Probable small right pleural effusion and underlying opacities which may represent atelectasis.  Minimal left basilar atelectasis.   Electronically Signed   By: Annia Belt M.D.   On: 05/08/2014 17:09    Assessment/Plan: Lumbar  discitis, osteomyelitis, epidural abscess: I had a long discussion with the patient, his wife, and daughter. We have discussed the various treatment options which include an incision and drainage of the wound, continued  treatment with IV antibiotics, needle aspiration of this epidural abscess, and hospice care. I have explained the procedure of an incision and drainage of the wound. We have discussed the risks including risks of anesthesia, spinal fluid leak, current infection, healing problems, et Karie Soda. We have talked about  "that definition of insanity is doing the same thing over and over again and expecting different results". I.e. he has had several incision and drainage of his wound, removal of the hardware, and prolonged IV antibiotics which has not lead to a resolution of his infection. I have explained that the purpose of surgery would be to drain his epidural abscess but then we would have to rely on his immune system and antibiotics to clear the rest of the infection. Thus far these measures have failed and frankly I don't have much hope that a repeat incision and drainage would clear his infection and lead to a good functional recovery. I have answered all their questions They are going to think things over and decide what they want to do. I discussed this case with Dr. Orvan Falconer and Dr. David Stall. Please let me know if I can be of further assistance.  LOS: 13 days     Jon Lozano D 05/09/2014,  1:28 PM

## 2014-05-09 NOTE — Progress Notes (Signed)
Pt  For transfer to 2c 05. Report given to [employer]

## 2014-05-09 NOTE — Progress Notes (Signed)
Patient very drowsy/lethargic this evening. Will respond to voice but not following all commands.  NP notified. PM meds will be held tonight.  (lyrica, remeron, lactobacillus, and senna).  NP comfortable with this decision. Will monitor.  M.Foster Simpson, RN

## 2014-05-09 NOTE — Progress Notes (Addendum)
[facility] PROGRESS NOTE Interim History: 73 y.o. M who underwent back surgery 01/04/14, complicated by hardware infection with MRSA, VRE, and proteus bacteremia. [facility]. Hardware was removed and [facility] Course further complicated by Klebsiella PNA on sputum culture. On 6/10, was transferred to CIR then 6/15 developed new fever, altered mental status, lethargy. PCCM was consulted. Wife requested to be transfer to [place] [facility] as she want Dr. Loralie Champagneurrani   Assessment/Plan: Acute resp failure with hypoxia: [facility]. Mild diastolic heart failure contributing along with COPD: - Continue antibiotics as dictated/recommended by ID;  - Will continue flutter valve/IS. - Continue pulmicort and PRN Xopenex. - D/c lasix, start IV Fluids, check ABG.  Fever due to abscess, measuring 2 x 5cm on L4-L5. - WOC oncsult. Currently on Vanc. and meropenem day 6 with ongoing fevers. - Pt has started spiking fever. - Blood cx's has been repeated on 6/22 and have remained negative. - MRI of spine as below. Consult IR for abscess aspirate, but INR high, gave vit K start heparin once INR < 2.0. - Appreciate ID assistance, Recommendation to continue current abx's therapy, ID to evaluate. - I have consult Orthopedics Dr handy which has refused to see the patient. - Neurosurgery consulted recommended and I quote" I don't think he needs surgery presently, particularly in light of the fact that his had multiple incision and drainages him a wound healing problems, and he is frail. I spoke with Dr. Robb Matarrtiz. I recommend that the patient be treated with antibiotics. Furthermore I recommended that when the patient is medically stabilized he be returned to the care of Dr. Loralie Champagneurrani, his local orthopedic surgeon".  - The pt needs surgical debridement to control the infection the infection. - Reconsulted Orthopedics surgery which will d/w with partner. In  the mean time [facility] as it seems he is decompensated. - He is on prednisone due to his RA. - Family will like to meet with PMT.  COPD/Emphysema: currently: - No wheezing  - Per pulmonary service plan is to continue bronchial hygiene, pulmicort and PRN bronchodilators (xopenex given A. Fib)   Septic shock:  - Now resolved. - BP stable. - Patient off pressors since 6/18. - Will monitor vital signs closely.  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 - Continue heparin drip and coumadin.  - Cardiology consulted and recommended bisoprolol and HR stable.  RLE DVT:  - Hold coumadin give vit K as INR is therapeutic and cannot perform aspirate. - will change to heparin per pharmacy. - Eventually will benefit of xarelto therapy; will follow cardiology rec's on that   Urinary retention: - Most likely associated with BPH  - Started on flomax approx 5 days ago. - Voiding trials were successful and foley has been removed.  GERD:  - continue PPI   RA: continue prednisone   Acute toxic encephalopathy:  - Due to infection, with worsening fevers. Will transfer to stepdown. - ID will make further recommendations of antibiotics.  Dysphagia/deconditioning:   -As per SPT rec's will continue nectar thick liquids and heart healthy diet.  - Patient to be discharge to SNF vs LTAC for rehabilitation once medically stable   Severe protein calorie malnutrition: will follow nutritional service rec's    Code Status: full  Family Communication: no family at bedside  Disposition Plan: SNF  Consultants:  PCCM  cardiology  Procedures: - 6/10 Doppler legs >> partial acute DVT Rt common femoral vein  - 6/16 Echo >> mild LVH, EF 65 to 70%, grade 3 diastolic dysfx, mild AS, mild/mod MS, mod LA dilation  - 6/16 CT chest >> no PE, atherosclerosis, emphysema with interstitial  fibrosis, small pericardial effusions, Rt base consolidation, foci of calcification in pericardium  CULTURES:  - Blood 4/1 >>> neg  - Blood 5/26 >>> neg  - Blood ? Date >>> POS MRSA, VRE, PROTEUS.  - Sputum ? Date >>> POS KLEBSIELLA.  - Blood 6/15 >>>negative  - Urine 6/15 >>> negative  ANTIBIOTICS:  Cefepime ? >>> 5/15  vanc 6.25.2015>> Daptomycin >>> completed treatment 6/23 per ID)   Antibiotics: daptomycin and meropenem completed treatment 6/23) vanc and meropenm 6.24.2015  HPI/Subjective: Pt is now Lethergic, now responsive to pain.  Objective: Filed Vitals:   05/08/14 1940 05/08/14 1947 05/08/14 2019 05/09/14 0347  BP: 111/82     Pulse: 82  101 90  Temp: 98.1 F (36.7 C) 102.8 F (39.3 C)  98.8 F (37.1 C)  TempSrc: Oral Rectal  Oral  Resp: 19  18 19   Height:      Weight:    84.6 kg (186 lb 8.2 oz)  SpO2: 70% 95% 98% 90%    Intake/Output Summary (Last 24 hours) at 05/09/14 0803 Last data filed at 05/08/14 1012  Gross per 24 hour  Intake      0 ml  Output      0 ml  Net      0 ml   Filed Weights   05/07/14 0555 05/08/14 0636 05/09/14 0347  Weight: 82.89 kg (182 lb 11.8 oz) 83.8 kg (184 lb 11.9 oz) 84.6 kg (186 lb 8.2 oz)    Exam:  General: in no acute distress. lethargic HEENT: No bruits, no goiter.  Heart: Regular rate and rhythm. Lungs: Good air movement, clear Abdomen: Soft, nontender.  Data Reviewed: Basic Metabolic Panel:  Recent Labs Lab 05/05/14 1205 05/06/14 0355 05/07/14 0507 05/08/14 0459 05/09/14 0451  NA 135* 133* 138 140 141  K 3.4* 3.5* 3.9 4.4 4.0  CL 92* 91* 94* 95* 94*  CO2 35* 32 37* 36* 39*  GLUCOSE 133* 83 101* 88 104*  BUN 9 7 14 13 14   CREATININE 0.66 0.52 0.54 0.55 0.56  CALCIUM 8.9 8.9 9.7 9.7 9.0   Liver Function Tests: No results found for this basename: AST, ALT, ALKPHOS, BILITOT, PROT, ALBUMIN,  in the last 168 hours No results found for this basename: LIPASE, AMYLASE,  in the last 168 hours No results  found for this basename: AMMONIA,  in the last 168 hours CBC:  Recent Labs Lab 05/05/14 0915 05/05/14 1205 05/06/14 0355 05/07/14 0507 05/08/14 0459 05/09/14 0451  WBC 7.8  --  6.5 6.8 9.7 10.8*  NEUTROABS  --  6.9  --   --   --   --   HGB 8.0*  --  7.3* 7.6* 8.3* 7.5*  HCT 25.8*  --  23.5* 24.9* 27.2* 24.9*  MCV 84.6  --  84.8 86.5 88.3 88.0  PLT 176  --  170 171 266 230   Cardiac Enzymes:  Recent Labs Lab 05/03/14 0250  CKTOTAL 54   BNP (last 3 results)  Recent Labs  02/06/14 0605 02/15/14 0640 04/30/14 0953  PROBNP 2426.0* 1292.0* 1579.0*   CBG:  Recent Labs Lab 05/08/14 3016 05/08/14 1122 05/08/14 1631 05/08/14 2112 05/09/14 0109  GLUCAP 95 115* 109* 129* 85    Recent Results (from the past 240 hour(s))  CULTURE, BLOOD (ROUTINE X 2)     Status: None   Collection Time    05/03/14  3:01 PM      Result Value Ref Range Status   Specimen Description BLOOD LEFT ARM   Final   Special Requests BOTTLES DRAWN AEROBIC AND ANAEROBIC 10CC   Final   Culture  Setup Time     Final   Value: 05/03/2014 18:56     Performed at [facility]   Culture     Final   Value:        BLOOD CULTURE RECEIVED NO GROWTH TO DATE CULTURE WILL BE HELD FOR 5 DAYS BEFORE ISSUING A FINAL NEGATIVE REPORT     Performed at [facility]   Report Status PENDING   Incomplete  CULTURE, BLOOD (ROUTINE X 2)     Status: None   Collection Time    05/03/14  3:14 PM      Result Value Ref Range Status   Specimen Description BLOOD LEFT HAND   Final   Special Requests BOTTLES DRAWN AEROBIC ONLY 3CC   Final   Culture  Setup Time     Final   Value: 05/03/2014 18:58     Performed at [facility]   Culture     Final   Value:        BLOOD CULTURE RECEIVED NO GROWTH TO DATE CULTURE WILL BE HELD FOR 5 DAYS BEFORE ISSUING A FINAL NEGATIVE REPORT     Performed at [facility]   Report Status PENDING   Incomplete  URINE CULTURE     Status: None   Collection Time     05/04/14 10:02 PM      Result Value Ref Range Status   Specimen Description URINE, CLEAN CATCH   Final   Special Requests NONE   Final   Culture  Setup Time     Final   Value: 05/05/2014 04:27     Performed at [facility] Count     Final   Value: NO GROWTH     Performed at [facility]   Culture     Final   Value: NO GROWTH     Performed at [facility]   Report Status 05/06/2014 FINAL   Final  CATH TIP CULTURE     Status: None   Collection Time    05/05/14  2:18 PM      Result Value Ref Range Status   Specimen Description CATH TIP   Final   Special Requests NONE   Final   Culture     Final   Value: NO GROWTH 2 DAYS     Performed at [facility]   Report Status 05/07/2014 FINAL   Final  CULTURE, BLOOD (ROUTINE X 2)     Status: None   Collection Time    05/06/14 12:05 PM      Result Value Ref Range Status   Specimen Description BLOOD RIGHT HAND   Final   Special Requests BOTTLES DRAWN AEROBIC ONLY [facility]   Final   Culture  Setup Time     Final   Value: 05/06/2014 18:53     Performed at [facility]   Culture     Final   Value:        BLOOD CULTURE RECEIVED NO GROWTH TO DATE CULTURE  WILL BE HELD FOR 5 DAYS BEFORE ISSUING A FINAL NEGATIVE REPORT     Performed at [facility]   Report Status PENDING   Incomplete  CULTURE, BLOOD (ROUTINE X 2)     Status: None   Collection Time    05/06/14 12:15 PM      Result Value Ref Range Status   Specimen Description BLOOD RIGHT FOREARM   Final   Special Requests BOTTLES DRAWN AEROBIC ONLY [facility]   Final   Culture  Setup Time     Final   Value: 05/06/2014 18:54     Performed at [facility]   Culture     Final   Value:        BLOOD CULTURE RECEIVED NO GROWTH TO DATE CULTURE WILL BE HELD FOR 5 DAYS BEFORE ISSUING A FINAL NEGATIVE REPORT     Performed at [facility]   Report Status PENDING   Incomplete     Studies: Mr Lumbar Spine W Wo Contrast  05/07/2014    CLINICAL DATA:  Wound on the back. Infection following spinal fusion attempt.  EXAM: MRI LUMBAR SPINE WITHOUT AND WITH CONTRAST  TECHNIQUE: Multiplanar and multiecho pulse sequences of the lumbar spine were obtained without and with intravenous contrast.  CONTRAST:  75mL MULTIHANCE GADOBENATE DIMEGLUMINE 529 MG/ML IV SOLN  COMPARISON:  Radiographs 03/17/2014. MRI 11/10/2013. Intraoperative fluoroscopy 01/04/2014.  FINDINGS: Segmentation: Numbering used on prior exam is preserved with 5 lumbar type vertebral bodies.  Alignment: Straightening of the normal lumbar lordosis with slight reversal centered around L2-L3. Grade I retrolisthesis of L4 on L5 measuring 4 mm.  Vertebrae: Discitis/osteomyelitis is present at L3-L4, with endplate edema and enhancement.There is irregularity the endplates which is new compared to the prior exam from 2014 compatible osteolysis from disc space infection. The L3-L4 disc appears liquified.  Postsurgical changes are present at L4-L5 and L5-S1. No compression fracture.  Screw tracts are present in the L4 through S1 vertebrae compatible with removed rod and pedicle screw hardware.  Conus medullaris: Terminates at L1-L2, normal. Unchanged from prior.  Paraspinal tissues: Paraspinal muscular edema is most pronounced around the L3-L4 level but extends into both iliopsoas muscles. There is chronic LEFT psoas muscle atrophy. No psoas muscle abscess is identified. There is a RIGHT eccentric fluid collection dorsal to the laminectomies at L4 and L5 probably representing abscess. This measures 5.3 cm transverse by 1.8 cm AP and extends up to the posterior aspect of the thecal sac. On post gadolinium imaging, this demonstrates irregular peripheral enhancement, most suggestive of abscess. Areas of susceptibility artifact are present at the periphery. A spinal leak or seroma is considered less likely.  There are several areas of right-sided subcutaneous ulceration with cellulitis. Wound dressing  material appears present in the dorsal back. On the sagittal post gadolinium imaging, the right-sided fluid collection appears to communicate with the superficial ulcerations although this is incompletely imaged (image 3 series 9).  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  Disc levels:  L1-L2: Moderate central stenosis associated with a central disc extrusion with cranial extension. There is central thickening of the posterior longitudinal ligament that extends to the L2-L3 disc space. This probably represents an old disc extrusion anterior to the posterior longitudinal ligament. The spinal canal is congenitally narrow. No change from prior. Foramina patent.  L2-L3: Disc degeneration and desiccation with loss of height. Broad-based posterior disc bulging. Mild central stenosis. Foramina appear adequately patent.  L3-L4: Liquefaction of the disc. Discitis/  osteomyelitis. Enhancing granulation tissue extends deep to the posterior longitudinal ligament dorsal to L4 from the disc space. No discrete epidural abscess. L3 laminectomy with recurrent moderate central stenosis. Moderate RIGHT and mild LEFT foraminal stenosis. Bilateral lateral recess stenosis.  L4-L5: Laminectomy. Central canal is decompressed. Discectomy with interbody bone graft. Enhancing granulation tissue present in the epidural space and around both neural foramina. RIGHT eccentric previously mentioned posterior fluid collection extending inferiorly.  L5-S1: Discectomy and laminectomy. Central canal and lateral recesses decompressed. Moderate bilateral foraminal stenosis. Epidural granulation tissue.  IMPRESSION: 1. Subcutaneous ulceration in the dorsal soft tissues of the back eccentric to the RIGHT. Deep fluid collection compatible with abscess in the laminectomy bed at L4-L5 with tract communicating with the superficial ulcerations to the RIGHT of midline. Tract is best visualized on sagittal image number 3 series 9. 2. Development of  discitis/osteomyelitis at L3-L4 with moderate central stenosis. No definite epidural abscess. 3. L4-L5 and L5-S1 laminectomy with remove rod and pedicle screw hardware.   Electronically Signed   By: Andreas Newport M.D.   On: 05/07/2014 11:44   Dg Chest Port 1 View  05/08/2014   CLINICAL DATA:  Line placement.  EXAM: PORTABLE CHEST - 1 VIEW  COMPARISON:  Chest radiograph 05/02/2014  FINDINGS: Left upper extremity PICC line is present tip projecting over the superior vena cava. Stable cardiomegaly. Probable small right pleural effusion. Minimal heterogeneous opacities right lung base. Minimal heterogeneous opacities left lung base. No definite pneumothorax.  IMPRESSION: Left upper extremity PICC line tip projects over the superior vena cava.  Probable small right pleural effusion and underlying opacities which may represent atelectasis.  Minimal left basilar atelectasis.   Electronically Signed   By: Annia Belt M.D.   On: 05/08/2014 17:09    Scheduled Meds: . antiseptic oral rinse  15 mL Mouth Rinse q12n4p  . aspirin EC  81 mg Oral Daily  . bisoprolol  2.5 mg Oral Daily  . budesonide (PULMICORT) nebulizer solution  0.25 mg Nebulization BID  . collagenase   Topical Daily  . enoxaparin (LOVENOX) injection  80 mg Subcutaneous Q12H  . feeding supplement (PRO-STAT SUGAR FREE 64)  30 mL Oral TID WC  . feeding supplement (RESOURCE BREEZE)  1 Container Oral Q24H  . folic acid  1 mg Oral Daily  . furosemide  20 mg Oral BID  . insulin aspart  0-15 Units Subcutaneous TID WC  . lactobacillus acidophilus  1 tablet Oral BID  . lidocaine  1 patch Transdermal Daily  . meropenem (MERREM) IV  1 g Intravenous 3 times per day  . mirtazapine  15 mg Oral QHS  . multivitamin with minerals  1 tablet Oral Daily  . nutrition supplement (JUVEN)  1 packet Oral BID BM  . pantoprazole  40 mg Oral Q1200  . phytonadione  2.5 mg Oral Once  . predniSONE  10 mg Oral Q breakfast  . pregabalin  100 mg Oral TID  . senna  1  tablet Oral QHS  . sodium chloride  10-40 mL Intracatheter Q12H  . sodium chloride  10-40 mL Intracatheter Q12H  . tamsulosin  0.4 mg Oral Daily  . vancomycin  1,500 mg Intravenous Q24H  . vitamin C  250 mg Oral TID WC   Continuous Infusions: . sodium chloride 10 mL/hr at 05/07/14 2239     Marinda Elk  [facility] Pager [phone]. If 8PM-8AM, please contact night-coverage at [email or web address], password [facility] 05/09/2014, 8:03 AM  LOS: 13 days    **  Disclaimer: This note may have been dictated with voice recognition software. Similar sounding words can inadvertently be transcribed and this note may contain transcription errors which may not have been corrected upon publication of note.**

## 2014-05-10 ENCOUNTER — Inpatient Hospital Stay (HOSPITAL_COMMUNITY): Payer: Medicare Other

## 2014-05-10 DIAGNOSIS — T8140XA Infection following a procedure, unspecified, initial encounter: Secondary | ICD-10-CM

## 2014-05-10 DIAGNOSIS — M7989 Other specified soft tissue disorders: Secondary | ICD-10-CM

## 2014-05-10 DIAGNOSIS — Z515 Encounter for palliative care: Secondary | ICD-10-CM

## 2014-05-10 DIAGNOSIS — Y849 Medical procedure, unspecified as the cause of abnormal reaction of the patient, or of later complication, without mention of misadventure at the time of the procedure: Secondary | ICD-10-CM

## 2014-05-10 DIAGNOSIS — R5381 Other malaise: Secondary | ICD-10-CM

## 2014-05-10 DIAGNOSIS — R5383 Other fatigue: Secondary | ICD-10-CM

## 2014-05-10 DIAGNOSIS — R531 Weakness: Secondary | ICD-10-CM

## 2014-05-10 LAB — BASIC METABOLIC PANEL
BUN: 11 mg/dL (ref 6–23)
CHLORIDE: 98 meq/L (ref 96–112)
CO2: 35 meq/L — AB (ref 19–32)
Calcium: 8.7 mg/dL (ref 8.4–10.5)
Creatinine, Ser: 0.43 mg/dL — ABNORMAL LOW (ref 0.50–1.35)
GFR calc Af Amer: >90 mL/min (ref >90)
GFR calc non Af Amer: >90 mL/min (ref >90)
Glucose, Bld: 94 mg/dL (ref 70–99)
POTASSIUM: 3.4 meq/L — AB (ref 3.7–5.3)
Sodium: 143 mEq/L (ref 137–147)

## 2014-05-10 LAB — CBC
HEMATOCRIT: 23.7 % — AB (ref 39.0–52.0)
Hemoglobin: 7.2 g/dL — ABNORMAL LOW (ref 13.0–17.0)
MCH: 27.5 pg (ref 26.0–34.0)
MCHC: 30.4 g/dL (ref 30.0–36.0)
MCV: 90.5 fL (ref 78.0–100.0)
PLATELETS: 191 10*3/uL (ref 150–400)
RBC: 2.62 MIL/uL — AB (ref 4.22–5.81)
RDW: 18.2 % — AB (ref 11.5–15.5)
WBC: 8.1 10*3/uL (ref 4.0–10.5)

## 2014-05-10 LAB — GLUCOSE, CAPILLARY
Glucose-Capillary: 125 mg/dL — ABNORMAL HIGH (ref 70–99)
Glucose-Capillary: 91 mg/dL (ref 70–99)
Glucose-Capillary: 161 mg/dL — ABNORMAL HIGH (ref 70–99)
Glucose-Capillary: 138 mg/dL — ABNORMAL HIGH (ref 70–99)

## 2014-05-10 LAB — HEPARIN LEVEL (UNFRACTIONATED): Heparin Unfractionated: 0.43 IU/mL (ref 0.30–0.70)

## 2014-05-10 LAB — PROTIME-INR
INR: 1.63 — ABNORMAL HIGH (ref 0.00–1.49)
PROTHROMBIN TIME: 19.3 s — AB (ref 11.6–15.2)

## 2014-05-10 MED ORDER — HEPARIN (PORCINE) IN NACL 100-0.45 UNIT/ML-% IJ SOLN
1600.0000 [IU]/h | INTRAMUSCULAR | Status: DC
  Administered 2014-05-10 – 2014-05-12 (×3): 1600 [IU]/h via INTRAVENOUS
  Filled 2014-05-10 (×5): qty 250

## 2014-05-10 MED ORDER — FLUCONAZOLE IN SODIUM CHLORIDE 400-0.9 MG/200ML-% IV SOLN
400.0000 mg | INTRAVENOUS | Status: DC
  Administered 2014-05-10: 400 mg via INTRAVENOUS
  Filled 2014-05-10 (×2): qty 200

## 2014-05-10 NOTE — Progress Notes (Signed)
Patient ID: Jon Lozano, male   DOB: 10-05-1941, 73 y.o.   MRN: [id number]    INR still 1.63 this am Must be 1.5 or lower to safely proceed with aspiration of epidural abscess  We need to talk with [facility] MD  Pt may be transferred to [facility]

## 2014-05-10 NOTE — Progress Notes (Signed)
ANTICOAGULATION CONSULT NOTE - Initial Consult  Pharmacy Consult for Heparin  Indication: DVT  No Known Allergies  Patient Measurements: Height: 5\' 8"  (172.7 cm) Weight: 175 lb 7.8 oz (79.6 kg) IBW/kg (Calculated) : 68.4  Vital Signs: Temp: 97.8 F (36.6 C) (06/29 0400) Temp src: Oral (06/29 0400) BP: 112/72 mmHg (06/29 0600) Pulse Rate: 97 (06/29 0600)  Labs:  Recent Labs  05/08/14 0459 05/09/14 0451 05/09/14 1825 05/10/14 0540  HGB 8.3* 7.5*  --  7.2*  HCT 27.2* 24.9*  --  23.7*  PLT 266 230  --  191  LABPROT 21.0* 34.4* 25.0* 19.3*  INR 1.81* 3.41* 2.26* 1.63*  CREATININE 0.55 0.56  --  0.43*    Estimated Creatinine Clearance: 81.9 ml/min (by C-G formula based on Cr of 0.43).   Medical History: Past Medical History  Diagnosis Date  . Hypertension   . Heart murmur   . Peripheral vascular disease   . Arthritis   . Hepatitis 1954  . Mitral stenosis   . Aortic stenosis   . Atrial fibrillation   . Pericardial calcification     Assessment: 73 y/o M starting heparin now that INR is <2. Was on Lovenox/Warfarin bridge. INR is 1.63, Hgb 7.2 (has been in the 7-8 range), other labs as above.   Goal of Therapy:  Heparin level 0.3-0.7 units/ml Monitor platelets by anticoagulation protocol: Yes   Plan:  -Start heparin drip at 1600 units/hr (was therapeutic previously at this rate) -1600 HL -Daily CBC/HL -Monitor for bleeding  62 05/10/2014,7:54 AM

## 2014-05-10 NOTE — Consult Note (Signed)
Patient Jon Lozano      DOB: [birth year]      XKP:[id number]     Consult Note from the Palliative Medicine Team at [facility]    Consult Requested by: Dr Radonna Ricker     PCP: Pcp Not In System Reason for Consultation:Clarification of GOC and options     Phone Number:None  Assessment of patients Current state:  73 y.o. M who underwent back surgery 01/04/14, complicated by hardware infection with MRSA, VRE, and proteus bacteremia. He was initially on [facility], then d/c'd to SNF on 4/29. Hardware was removed and he was readmitted to [facility] on 5/11. Course further complicated by Klebsiella PNA on sputum culture. On 6/10, was transferred to CIR then 6/15 developed new fever, altered mental status, lethargy and  readmitted to [facility]. Now with new epidural abscess.  Con tinued overall poor response to medical interventions, weak, lethargic and failing to thrive.  Poor prognosis.  Patient and his wife are faced with treatment decisions,  advanced directive decisions and anticipatory care needs    This NP Lorinda Creed reviewed medical records, received report from team, assessed the patient and then meet at the patient's bedside along with wife  to discuss diagnosis prognosis, GOC, EOL wishes disposition and options.  A detailed discussion was had today regarding advanced directives.  Concepts specific to code status, artifical feeding and hydration, continued IV antibiotics and rehospitalization was had.  The difference between a aggressive medical intervention path  and a palliative comfort care path for this patient at this time was had.  Values and goals of care important to patient and family were attempted to be elicited.  Concept of Hospice and Palliative Care were discussed   Questions and concerns addressed.  Hard Choices booklet left for review. Family encouraged to call with questions or concerns.  PMT will continue to support holistically.   Goals of Care: 1.  Code Status:  Full  code  -detailed discussion regarding the futility of attempting resuscitation in this patient at this time in this situation  2. Scope of Treatment:  At this time patient/family are open to all offered and available medical interventions to prolong life, they are hopeful for improvement.    Patient is able to share his hopelessness but is not at the point of "throwing in the towel just yet"  --both wife and patient are considering the option of aspiration of cyst as described  by neurology, and continuing IV antibiotics long term    3. Disposition:  Open to SNF for continued antibiotics and rehabilitation once medically stable.  We discussed the very real possibility that the patient may not improve, with or without intervention, and that  a comfort approach with hospice support may need to be  considered.   4. Symptom Management:    1. Pain:  Oxycodone 5 mg every 6 hrs prn 2.  Weakness/Failure to thrive:  At this time continue medical management of disease state.   5. Psychosocial:  Emotional support offered to both patient and his wife  6. Spiritual:  Chaplain involved   Brief HPI: 73 y.o. M who underwent back surgery 01/04/14, complicated by hardware infection with MRSA, VRE, and proteus bacteremia. He was initially on [facility], then d/c'd to SNF on 4/29. Hardware was removed and he was readmitted to [facility] on 5/11. Course further complicated by Klebsiella PNA on sputum culture. On 6/10, was transferred to CIR then 6/15 developed new fever, altered mental status, lethargy and  readmitted to [facility].  Con tinued overall poor response to medical interventions, weak, lethargic and failing to thrive.  Poor long term prognosis.  ROS: weakness, fatigue, back pain,    PMH:  Past Medical History  Diagnosis Date  . Hypertension   . Heart murmur   . Peripheral vascular disease   . Arthritis   . Hepatitis 1954  . Mitral stenosis   . Aortic stenosis   . Atrial fibrillation   . Pericardial  calcification      PSH: Past Surgical History  Procedure Laterality Date  . Cholecystectomy  1983  . Pericardial fluid drainage  2007  . Cataract extraction  2012  . Tee without cardioversion  10/23/2012    Procedure: TRANSESOPHAGEAL ECHOCARDIOGRAM (TEE);  Surgeon: Thurmon Fair, MD;  Location: [facility] ENDOSCOPY;  Service: Cardiovascular;  Laterality: N/A;  to come in1130 for work up   I have reviewed the FH and SH and  If appropriate update it with new information. No Known Allergies Scheduled Meds: . antiseptic oral rinse  15 mL Mouth Rinse q12n4p  . aspirin EC  81 mg Oral Daily  . bisoprolol  2.5 mg Oral Daily  . budesonide (PULMICORT) nebulizer solution  0.25 mg Nebulization BID  . collagenase   Topical Daily  . DAPTOmycin (CUBICIN)  IV  500 mg Intravenous Q24H  . feeding supplement (PRO-STAT SUGAR FREE 64)  30 mL Oral TID WC  . feeding supplement (RESOURCE BREEZE)  1 Container Oral Q24H  . fluconazole (DIFLUCAN) IV  400 mg Intravenous Q24H  . folic acid  1 mg Oral Daily  . insulin aspart  0-15 Units Subcutaneous TID WC  . lactobacillus acidophilus  1 tablet Oral BID  . lidocaine  1 patch Transdermal Daily  . meropenem (MERREM) IV  1 g Intravenous 3 times per day  . mirtazapine  7.5 mg Oral QHS  . multivitamin with minerals  1 tablet Oral Daily  . nutrition supplement (JUVEN)  1 packet Oral BID BM  . pantoprazole  40 mg Oral Q1200  . predniSONE  10 mg Oral Q breakfast  . pregabalin  100 mg Oral TID  . senna  1 tablet Oral QHS  . sodium chloride  10-40 mL Intracatheter Q12H  . tamsulosin  0.4 mg Oral Daily  . vitamin C  250 mg Oral TID WC   Continuous Infusions: . heparin 1,600 Units/hr (05/10/14 0949)   PRN Meds:.acetaminophen, levalbuterol, oxyCODONE, RESOURCE THICKENUP CLEAR, sodium chloride    BP 112/72  Pulse 97  Temp(Src) 97.9 F (36.6 C) (Oral)  Resp 21  Ht 5\' 8"  (1.727 m)  Wt 79.6 kg (175 lb 7.8 oz)  BMI 26.69 kg/m2  SpO2 99%   PPS:30 % at  best   Intake/Output Summary (Last 24 hours) at 05/10/14 1125 Last data filed at 05/10/14 0915  Gross per 24 hour  Intake 3985.09 ml  Output    475 ml  Net 3510.09 ml    Physical Exam:  General: ill appearing, dyspneic at rest HEENT:  Moist buccal membraens Chest:   Diminished throughout, scattered Wh and RH CVS: tachycardic, rate 108 Abdomen: soft NT +BS Ext: +2 edema BUE Neuro: lethargic, engages in conversation at this time   Labs: CBC    Component Value Date/Time   WBC 8.1 05/10/2014 0540   RBC 2.62* 05/10/2014 0540   HGB 7.2* 05/10/2014 0540   HCT 23.7* 05/10/2014 0540   PLT 191 05/10/2014 0540   MCV 90.5 05/10/2014 0540   MCH 27.5 05/10/2014 0540  MCHC 30.4 05/10/2014 0540   RDW 18.2* 05/10/2014 0540   LYMPHSABS 0.9 05/05/2014 1205   MONOABS 0.6 05/05/2014 1205   EOSABS 0.1 05/05/2014 1205   BASOSABS 0.0 05/05/2014 1205    BMET    Component Value Date/Time   NA 143 05/10/2014 0540   K 3.4* 05/10/2014 0540   CL 98 05/10/2014 0540   CO2 35* 05/10/2014 0540   GLUCOSE 94 05/10/2014 0540   BUN 11 05/10/2014 0540   CREATININE 0.43* 05/10/2014 0540   CALCIUM 8.7 05/10/2014 0540   GFRNONAA >90 05/10/2014 0540   GFRAA >90 05/10/2014 0540    CMP     Component Value Date/Time   NA 143 05/10/2014 0540   K 3.4* 05/10/2014 0540   CL 98 05/10/2014 0540   CO2 35* 05/10/2014 0540   GLUCOSE 94 05/10/2014 0540   BUN 11 05/10/2014 0540   CREATININE 0.43* 05/10/2014 0540   CALCIUM 8.7 05/10/2014 0540   PROT 6.7 04/25/2014 1805   ALBUMIN 2.7* 04/25/2014 1805   AST 26 04/25/2014 1805   ALT 14 04/25/2014 1805   ALKPHOS 152* 04/25/2014 1805   BILITOT 0.8 04/25/2014 1805   GFRNONAA >90 05/10/2014 0540   GFRAA >90 05/10/2014 0540       Time In Time Out Total Time Spent with Patient Total Overall Time  1300 1500 110 min 120 min    Greater than 50%  of this time was spent counseling and coordinating care related to the above assessment and plan.   Lorinda Creed NP  Palliative Medicine Team  Team Phone # [phone] Pager [phone]  Discussed with Dr Radonna Ricker

## 2014-05-10 NOTE — Progress Notes (Signed)
Patient transferred to [facility]. Handoff report provided to Maryclare Labrador, LCSWA. Palliative to meet with patient/wife today at 1 pm for goals of care.  This CSW will sign off.  Lorri Frederick. [place]  [phone]

## 2014-05-10 NOTE — Progress Notes (Signed)
CSW sent progress notes to [facility] SNF. CSW following for discharge plans--GOC meeting scheduled today, and CSW will update/revise discharge plan as needed based on pt/family wishes.  Maryclare Labrador, MSW, [facility] Clinical Social Worker [phone]

## 2014-05-10 NOTE — Progress Notes (Addendum)
Patient ID: Jon Lozano, male   DOB: 12/04/1940, 73 y.o.   MRN: [id number]         [facility]    Date of Admission:  04/26/2014   Prolonged antibiotic therapy        Day 14 meropenem        Day 3 cubicin  Active Problems:   Sepsis   Hypotension   Encephalopathy acute   Mitral stenosis   Aortic stenosis   Atrial fibrillation   Pericardial calcification   Diskitis   Osteoporosis, unspecified   . antiseptic oral rinse  15 mL Mouth Rinse q12n4p  . aspirin EC  81 mg Oral Daily  . bisoprolol  2.5 mg Oral Daily  . budesonide (PULMICORT) nebulizer solution  0.25 mg Nebulization BID  . collagenase   Topical Daily  . DAPTOmycin (CUBICIN)  IV  500 mg Intravenous Q24H  . feeding supplement (PRO-STAT SUGAR FREE 64)  30 mL Oral TID WC  . feeding supplement (RESOURCE BREEZE)  1 Container Oral Q24H  . fluconazole (DIFLUCAN) IV  400 mg Intravenous Q24H  . folic acid  1 mg Oral Daily  . insulin aspart  0-15 Units Subcutaneous TID WC  . lactobacillus acidophilus  1 tablet Oral BID  . lidocaine  1 patch Transdermal Daily  . meropenem (MERREM) IV  1 g Intravenous 3 times per day  . mirtazapine  7.5 mg Oral QHS  . multivitamin with minerals  1 tablet Oral Daily  . nutrition supplement (JUVEN)  1 packet Oral BID BM  . pantoprazole  40 mg Oral Q1200  . predniSONE  10 mg Oral Q breakfast  . pregabalin  100 mg Oral TID  . senna  1 tablet Oral QHS  . sodium chloride  10-40 mL Intracatheter Q12H  . tamsulosin  0.4 mg Oral Daily  . vitamin C  250 mg Oral TID WC    Subjective: He is complaining of increasing back pain. No diarrhea reported  Objective: Temp:  [97.8 F (36.6 C)-99.3 F (37.4 C)] 97.9 F (36.6 C) (06/29 0759) Pulse Rate:  [86-105] 97 (06/29 0600) Resp:  [13-21] 21 (06/29 0600) BP: (77-120)/(47-72) 112/72 mmHg (06/29 0600) SpO2:  [96 %-100 %] 99 % (06/29 0745) Weight:  [175 lb 7.8 oz (79.6 kg)] 175 lb 7.8 oz (79.6 kg) (06/29 0400)  General:  Awake. He responds slowly but appropriately to questions Skin: No rash. Left arm PICC site appears normal Lungs: Clear Cor: Regular S1 and S2 with no murmurs Abdomen: Soft and nontender. No diarrhea Joints extremities: Diffuse right arm swelling. Old PICC site looks good. Multiple covered wounds on the legs. Back: 2 small open wounds from previous lumbar surgeries without drainage or surrounding cellulitis. Larger mid back wound with shaggy yellow base and thin serous drainage. No odor or surrounding cellulitis. He has diffuse redness over his back and buttocks compatible with candidal infection  Lab Results Lab Results  Component Value Date   WBC 8.1 05/10/2014   HGB 7.2* 05/10/2014   HCT 23.7* 05/10/2014   MCV 90.5 05/10/2014   PLT 191 05/10/2014    Lab Results  Component Value Date   CREATININE 0.43* 05/10/2014   BUN 11 05/10/2014   NA 143 05/10/2014   K 3.4* 05/10/2014   CL 98 05/10/2014   CO2 35* 05/10/2014    Lab Results  Component Value Date   ALT 14 04/25/2014   AST 26 04/25/2014   ALKPHOS 152* 04/25/2014  BILITOT 0.8 04/25/2014      Microbiology: Recent Results (from the past 240 hour(s))  CULTURE, BLOOD (ROUTINE X 2)     Status: None   Collection Time    05/03/14  3:01 PM      Result Value Ref Range Status   Specimen Description BLOOD LEFT ARM   Final   Special Requests BOTTLES DRAWN AEROBIC AND ANAEROBIC 10CC   Final   Culture  Setup Time     Final   Value: 05/03/2014 18:56     Performed at [facility]   Culture     Final   Value: NO GROWTH 5 DAYS     Performed at [facility]   Report Status 05/09/2014 FINAL   Final  CULTURE, BLOOD (ROUTINE X 2)     Status: None   Collection Time    05/03/14  3:14 PM      Result Value Ref Range Status   Specimen Description BLOOD LEFT HAND   Final   Special Requests BOTTLES DRAWN AEROBIC ONLY 3CC   Final   Culture  Setup Time     Final   Value: 05/03/2014 18:58     Performed at [facility]   Culture      Final   Value: NO GROWTH 5 DAYS     Performed at [facility]   Report Status 05/09/2014 FINAL   Final  URINE CULTURE     Status: None   Collection Time    05/04/14 10:02 PM      Result Value Ref Range Status   Specimen Description URINE, CLEAN CATCH   Final   Special Requests NONE   Final   Culture  Setup Time     Final   Value: 05/05/2014 04:27     Performed at [facility] Count     Final   Value: NO GROWTH     Performed at [facility]   Culture     Final   Value: NO GROWTH     Performed at [facility]   Report Status 05/06/2014 FINAL   Final  CATH TIP CULTURE     Status: None   Collection Time    05/05/14  2:18 PM      Result Value Ref Range Status   Specimen Description CATH TIP   Final   Special Requests NONE   Final   Culture     Final   Value: NO GROWTH 2 DAYS     Performed at [facility]   Report Status 05/07/2014 FINAL   Final  CULTURE, BLOOD (ROUTINE X 2)     Status: None   Collection Time    05/06/14 12:05 PM      Result Value Ref Range Status   Specimen Description BLOOD RIGHT HAND   Final   Special Requests BOTTLES DRAWN AEROBIC ONLY [facility]   Final   Culture  Setup Time     Final   Value: 05/06/2014 18:53     Performed at [facility]   Culture     Final   Value:        BLOOD CULTURE RECEIVED NO GROWTH TO DATE CULTURE WILL BE HELD FOR 5 DAYS BEFORE ISSUING A FINAL NEGATIVE REPORT     Performed at [facility]   Report Status PENDING   Incomplete  CULTURE, BLOOD (ROUTINE X 2)     Status: None   Collection  Time    05/06/14 12:15 PM      Result Value Ref Range Status   Specimen Description BLOOD RIGHT FOREARM   Final   Special Requests BOTTLES DRAWN AEROBIC ONLY [facility]   Final   Culture  Setup Time     Final   Value: 05/06/2014 18:54     Performed at [facility]   Culture     Final   Value:        BLOOD CULTURE RECEIVED NO GROWTH TO DATE CULTURE WILL BE HELD FOR 5 DAYS BEFORE  ISSUING A FINAL NEGATIVE REPORT     Performed at [facility]   Report Status PENDING   Incomplete  CULTURE, BLOOD (ROUTINE X 2)     Status: None   Collection Time    05/09/14  2:50 PM      Result Value Ref Range Status   Specimen Description BLOOD RIGHT HAND   Final   Special Requests BOTTLES DRAWN AEROBIC ONLY 8CC   Final   Culture  Setup Time     Final   Value: 05/09/2014 22:50     Performed at [facility]   Culture     Final   Value:        BLOOD CULTURE RECEIVED NO GROWTH TO DATE CULTURE WILL BE HELD FOR 5 DAYS BEFORE ISSUING A FINAL NEGATIVE REPORT     Performed at [facility]   Report Status PENDING   Incomplete  MRSA PCR SCREENING     Status: None   Collection Time    05/09/14  3:49 PM      Result Value Ref Range Status   MRSA by PCR NEGATIVE  NEGATIVE Final   Comment:            The GeneXpert MRSA Assay (FDA     approved for NASAL specimens     only), is one component of a     comprehensive MRSA colonization     surveillance program. It is not     intended to diagnose MRSA     infection nor to guide or     monitor treatment for     MRSA infections.  CULTURE, BLOOD (ROUTINE X 2)     Status: None   Collection Time    05/09/14  4:20 PM      Result Value Ref Range Status   Specimen Description BLOOD RIGHT ANKLE   Final   Special Requests BOTTLES DRAWN AEROBIC AND ANAEROBIC 5CC   Final   Culture  Setup Time     Final   Value: 05/09/2014 22:50     Performed at [facility]   Culture     Final   Value:        BLOOD CULTURE RECEIVED NO GROWTH TO DATE CULTURE WILL BE HELD FOR 5 DAYS BEFORE ISSUING A FINAL NEGATIVE REPORT     Performed at [facility]   Report Status PENDING   Incomplete    Studies/Results: Dg Chest Port 1 View  05/10/2014   CLINICAL DATA:  Difficulty breathing  EXAM: PORTABLE CHEST - 1 VIEW  COMPARISON:  05/08/2014  FINDINGS: A left-sided PICC line is again noted in the mid superior vena cava. Bibasilar changes  are noted slightly worse on the right than the left. Small right-sided effusion is noted as well. These changes of increased in the interval from the prior exam particularly in the right lung base.  IMPRESSION: Increasing right basilar  atelectasis and effusion.   Electronically Signed   By: Alcide Clever M.D.   On: 05/10/2014 07:50   Dg Chest Port 1 View  05/08/2014   CLINICAL DATA:  Line placement.  EXAM: PORTABLE CHEST - 1 VIEW  COMPARISON:  Chest radiograph 05/02/2014  FINDINGS: Left upper extremity PICC line is present tip projecting over the superior vena cava. Stable cardiomegaly. Probable small right pleural effusion. Minimal heterogeneous opacities right lung base. Minimal heterogeneous opacities left lung base. No definite pneumothorax.  IMPRESSION: Left upper extremity PICC line tip projects over the superior vena cava.  Probable small right pleural effusion and underlying opacities which may represent atelectasis.  Minimal left basilar atelectasis.   Electronically Signed   By: Annia Belt M.D.   On: 05/08/2014 17:09    Assessment: 72yo M with complex back surgery c/b SSI infection poor wound healing, previously on long term antibiotics including daptomycin, meropenem. He had recurrence of high fever of 102.8 on 6/27, in setting of new picc line. MRI imaging suggest still ongoing deep fluid collection at laminectomy bed concern for abscess. Poor surgical candidate.   Plan: 1. Continue meropenem, daptomycin and  Fluconazole for empiric coverage for now 2. Per wife's report, patient is too high risk for aspiration procedure since it would entail general anesthesia?.  3. Right arm swelling, if it appears worsening, consider doing u/s to rule out dvt   Judyann Munson, MD [facility] [phone] pager   [phone] cell 05/10/2014, 10:29 AM

## 2014-05-10 NOTE — Progress Notes (Signed)
PT Cancellation Note  Patient Details Name: Jon Lozano MRN: [id number] DOB: Mar 09, 1941   Cancelled Treatment:    Reason Eval/Treat Not Completed: Medical issues which prohibited therapy. Noted pt currently subtherapeutic on Lovenox with Heparin initiated (for LE DVT) due to pt pending epidural abscess aspiration. Also noted plan for GOC meeting today at 1:00 pm. Will await results of this meeting (and pt to again be therapeutic on anticoagulation) and proceed with PT if appropriate.    SASSER,LYNN 05/10/2014, 10:30 AM Pager [phone]

## 2014-05-10 NOTE — Progress Notes (Signed)
[facility] PROGRESS NOTE Interim History: 73 y.o. M who underwent back surgery 01/04/14, complicated by hardware infection with MRSA, VRE, and proteus bacteremia. He was initially on [facility], then d/c'd to SNF on 4/29. Hardware was removed and he was readmitted to [facility] on 5/11. Course further complicated by Klebsiella PNA on sputum culture. On 6/10, was transferred to CIR then 6/15 developed new fever, altered mental status, lethargy. PCCM was consulted. Wife requested to be transfer to [place] [facility] as she want Dr. Loralie Champagne   Assessment/Plan: Acute resp failure with hypoxia: due to [facility]. Mild diastolic heart failure contributing along with COPD: - Continue antibiotics as dictated/recommended by ID;  - Will continue flutter valve/IS. - Continue pulmicort and PRN Xopenex. - D/c lasix, KVO IV Fluids. Pulmonary consulted appreciate assistance. - CXR pending.  Fever due to abscess, measuring 2 x 5cm on L4-L5. - WOC oncsult. Change Vanc to daptomycin and meropenem day added diflucan. - Pt has started spiking fever. - Blood cx's has been repeated on 6/22 and have remained negative. - MRI of spine as below. Consult IR for abscess aspirate, but INR high, gave vit K start heparin once INR < 2.0. - Appreciate ID assistance, Recommendation to continue current abx's therapy, ID to evaluate. - I have consult Orthopedics Dr handy which has refused to see the patient. - Neurosurgery consulted recommended  and no further intervention due to being a poor surgical candidate.Marland Kitchen appreciate Neurosurgery assistance - He is on prednisone due to his RA. - Family will like to meet with PMT.  COPD/Emphysema: currently: - No wheezing  - Per pulmonary service plan is to continue bronchial hygiene, pulmicort and PRN bronchodilators (xopenex given A. Fib)   Septic shock:  - BP stable. - Patient off pressors since 6/18. - Will monitor vital signs closely.  Atrial fibrillation and pericardial  calcification/constrictive pericarditis:  - Per cardiology; 2-D echo, Now in SR. - Resume heparin. - Cardiology consulted and recommended bisoprolol and HR stable.  RLE DVT:  - INR is sub-therapeutic, start heparin, hold on aspirate. - Heparin per pharmacy.  Urinary retention: - Most likely associated with BPH  - Started on flomax approx 5 days ago. - Voiding trials were successful and foley has been removed.  GERD:  - continue PPI   RA: continue prednisone   Acute toxic encephalopathy:  - Due to infection, with worsening fevers.  - ID will make further recommendations of antibiotics.  Dysphagia/deconditioning:   -As per SPT rec's will continue nectar thick liquids and heart healthy diet.  - Patient to be discharge to SNF vs LTAC for rehabilitation once medically stable   Severe protein calorie malnutrition: will follow nutritional service rec's    Code Status: full  Family Communication: no family at bedside  Disposition Plan: SNF    Consultants:  PCCM  cardiology  Procedures: - 6/10 Doppler legs >> partial acute DVT Rt common femoral vein  - 6/16 Echo >> mild LVH, EF 65 to 70%, grade 3 diastolic dysfx, mild AS, mild/mod MS, mod LA dilation  - 6/16 CT chest >> no PE, atherosclerosis, emphysema with interstitial fibrosis, small pericardial effusions, Rt base consolidation, foci of calcification in pericardium  CULTURES:  - Blood 4/1 >>> neg  - Blood 5/26 >>> neg  - Blood ? Date >>> POS MRSA, VRE, PROTEUS.  - Sputum ? Date >>> POS KLEBSIELLA.  - Blood 6/15 >>>negative  - Urine 6/15 >>> negative  ANTIBIOTICS:  Cefepime ? >>> 5/15  vanc 6.25.2015>> Daptomycin >>>  completed treatment 6/23 per ID)   Antibiotics: daptomycin and meropenem completed treatment 6/23) vanc and meropenm 6.24.2015  HPI/Subjective: Pt response is slow, he relate he is tired not feeling well.  Objective: Filed Vitals:   05/10/14 0222 05/10/14 0400 05/10/14 0600 05/10/14 0745    BP: 88/54 107/71 112/72   Pulse: 87 100 97   Temp:  97.8 F (36.6 C)    TempSrc:  Oral    Resp: 13 21 21    Height:      Weight:  79.6 kg (175 lb 7.8 oz)    SpO2: 100% 96% 100% 99%    Intake/Output Summary (Last 24 hours) at 05/10/14 0749 Last data filed at 05/10/14 0600  Gross per 24 hour  Intake 3855.09 ml  Output    475 ml  Net 3380.09 ml   Filed Weights   05/08/14 0636 05/09/14 0347 05/10/14 0400  Weight: 83.8 kg (184 lb 11.9 oz) 84.6 kg (186 lb 8.2 oz) 79.6 kg (175 lb 7.8 oz)    Exam:  General: in no acute distress. Slow to respond HEENT: No bruits, no goiter.  Heart: Regular rate and rhythm. Lungs: Good air movement, clear Abdomen: Soft, nontender.  Data Reviewed: Basic Metabolic Panel:  Recent Labs Lab 05/06/14 0355 05/07/14 0507 05/08/14 0459 05/09/14 0451 05/10/14 0540  NA 133* 138 140 141 143  K 3.5* 3.9 4.4 4.0 3.4*  CL 91* 94* 95* 94* 98  CO2 32 37* 36* 39* 35*  GLUCOSE 83 101* 88 104* 94  BUN 7 14 13 14 11   CREATININE 0.52 0.54 0.55 0.56 0.43*  CALCIUM 8.9 9.7 9.7 9.0 8.7   Liver Function Tests: No results found for this basename: AST, ALT, ALKPHOS, BILITOT, PROT, ALBUMIN,  in the last 168 hours No results found for this basename: LIPASE, AMYLASE,  in the last 168 hours No results found for this basename: AMMONIA,  in the last 168 hours CBC:  Recent Labs Lab 05/05/14 1205 05/06/14 0355 05/07/14 0507 05/08/14 0459 05/09/14 0451 05/10/14 0540  WBC  --  6.5 6.8 9.7 10.8* 8.1  NEUTROABS 6.9  --   --   --   --   --   HGB  --  7.3* 7.6* 8.3* 7.5* 7.2*  HCT  --  23.5* 24.9* 27.2* 24.9* 23.7*  MCV  --  84.8 86.5 88.3 88.0 90.5  PLT  --  170 171 266 230 191   Cardiac Enzymes: No results found for this basename: CKTOTAL, CKMB, CKMBINDEX, TROPONINI,  in the last 168 hours BNP (last 3 results)  Recent Labs  02/06/14 0605 02/15/14 0640 04/30/14 0953  PROBNP 2426.0* 1292.0* 1579.0*   CBG:  Recent Labs Lab 05/08/14 1631  05/08/14 2112 05/09/14 0623 05/09/14 1234 05/09/14 1627  GLUCAP 109* 129* 85 111* 115*    Recent Results (from the past 240 hour(s))  CULTURE, BLOOD (ROUTINE X 2)     Status: None   Collection Time    05/03/14  3:01 PM      Result Value Ref Range Status   Specimen Description BLOOD LEFT ARM   Final   Special Requests BOTTLES DRAWN AEROBIC AND ANAEROBIC 10CC   Final   Culture  Setup Time     Final   Value: 05/03/2014 18:56     Performed at 05/05/14   Culture     Final   Value: NO GROWTH 5 DAYS     Performed at 05/05/2014   Report Status 05/09/2014  FINAL   Final  CULTURE, BLOOD (ROUTINE X 2)     Status: None   Collection Time    05/03/14  3:14 PM      Result Value Ref Range Status   Specimen Description BLOOD LEFT HAND   Final   Special Requests BOTTLES DRAWN AEROBIC ONLY 3CC   Final   Culture  Setup Time     Final   Value: 05/03/2014 18:58     Performed at [facility]   Culture     Final   Value: NO GROWTH 5 DAYS     Performed at [facility]   Report Status 05/09/2014 FINAL   Final  URINE CULTURE     Status: None   Collection Time    05/04/14 10:02 PM      Result Value Ref Range Status   Specimen Description URINE, CLEAN CATCH   Final   Special Requests NONE   Final   Culture  Setup Time     Final   Value: 05/05/2014 04:27     Performed at [facility] Count     Final   Value: NO GROWTH     Performed at [facility]   Culture     Final   Value: NO GROWTH     Performed at [facility]   Report Status 05/06/2014 FINAL   Final  CATH TIP CULTURE     Status: None   Collection Time    05/05/14  2:18 PM      Result Value Ref Range Status   Specimen Description CATH TIP   Final   Special Requests NONE   Final   Culture     Final   Value: NO GROWTH 2 DAYS     Performed at [facility]   Report Status 05/07/2014 FINAL   Final  CULTURE, BLOOD (ROUTINE X 2)     Status: None   Collection  Time    05/06/14 12:05 PM      Result Value Ref Range Status   Specimen Description BLOOD RIGHT HAND   Final   Special Requests BOTTLES DRAWN AEROBIC ONLY 6CC   Final   Culture  Setup Time     Final   Value: 05/06/2014 18:53     Performed at [facility]   Culture     Final   Value:        BLOOD CULTURE RECEIVED NO GROWTH TO DATE CULTURE WILL BE HELD FOR 5 DAYS BEFORE ISSUING A FINAL NEGATIVE REPORT     Performed at [facility]   Report Status PENDING   Incomplete  CULTURE, BLOOD (ROUTINE X 2)     Status: None   Collection Time    05/06/14 12:15 PM      Result Value Ref Range Status   Specimen Description BLOOD RIGHT FOREARM   Final   Special Requests BOTTLES DRAWN AEROBIC ONLY [facility]   Final   Culture  Setup Time     Final   Value: 05/06/2014 18:54     Performed at [facility]   Culture     Final   Value:        BLOOD CULTURE RECEIVED NO GROWTH TO DATE CULTURE WILL BE HELD FOR 5 DAYS BEFORE ISSUING A FINAL NEGATIVE REPORT     Performed at [facility]   Report Status PENDING   Incomplete  MRSA PCR SCREENING  Status: None   Collection Time    05/09/14  3:49 PM      Result Value Ref Range Status   MRSA by PCR NEGATIVE  NEGATIVE Final   Comment:            The GeneXpert MRSA Assay (FDA     approved for NASAL specimens     only), is one component of a     comprehensive MRSA colonization     surveillance program. It is not     intended to diagnose MRSA     infection nor to guide or     monitor treatment for     MRSA infections.     Studies: Dg Chest Port 1 View  05/08/2014   CLINICAL DATA:  Line placement.  EXAM: PORTABLE CHEST - 1 VIEW  COMPARISON:  Chest radiograph 05/02/2014  FINDINGS: Left upper extremity PICC line is present tip projecting over the superior vena cava. Stable cardiomegaly. Probable small right pleural effusion. Minimal heterogeneous opacities right lung base. Minimal heterogeneous opacities left lung base. No definite  pneumothorax.  IMPRESSION: Left upper extremity PICC line tip projects over the superior vena cava.  Probable small right pleural effusion and underlying opacities which may represent atelectasis.  Minimal left basilar atelectasis.   Electronically Signed   By: Annia Belt M.D.   On: 05/08/2014 17:09    Scheduled Meds: . antiseptic oral rinse  15 mL Mouth Rinse q12n4p  . aspirin EC  81 mg Oral Daily  . bisoprolol  2.5 mg Oral Daily  . budesonide (PULMICORT) nebulizer solution  0.25 mg Nebulization BID  . collagenase   Topical Daily  . DAPTOmycin (CUBICIN)  IV  500 mg Intravenous Q24H  . feeding supplement (PRO-STAT SUGAR FREE 64)  30 mL Oral TID WC  . feeding supplement (RESOURCE BREEZE)  1 Container Oral Q24H  . fluconazole (DIFLUCAN) IV  100 mg Intravenous Q24H  . folic acid  1 mg Oral Daily  . insulin aspart  0-15 Units Subcutaneous TID WC  . lactobacillus acidophilus  1 tablet Oral BID  . lidocaine  1 patch Transdermal Daily  . meropenem (MERREM) IV  1 g Intravenous 3 times per day  . mirtazapine  7.5 mg Oral QHS  . multivitamin with minerals  1 tablet Oral Daily  . nutrition supplement (JUVEN)  1 packet Oral BID BM  . pantoprazole  40 mg Oral Q1200  . predniSONE  10 mg Oral Q breakfast  . pregabalin  100 mg Oral TID  . senna  1 tablet Oral QHS  . sodium chloride  10-40 mL Intracatheter Q12H  . tamsulosin  0.4 mg Oral Daily  . vitamin C  250 mg Oral TID WC   Continuous Infusions:     Marinda Elk  [facility] Pager [phone]. If 8PM-8AM, please contact night-coverage at [email or web address], password [facility] 05/10/2014, 7:49 AM  LOS: 14 days    **Disclaimer: This note may have been dictated with voice recognition software. Similar sounding words can inadvertently be transcribed and this note may contain transcription errors which may not have been corrected upon publication of note.**

## 2014-05-10 NOTE — Progress Notes (Signed)
ANTICOAGULATION CONSULT NOTE   Pharmacy Consult for Heparin  Indication: DVT  No Known Allergies  Patient Measurements: Height: 5\' 8"  (172.7 cm) Weight: 175 lb 7.8 oz (79.6 kg) IBW/kg (Calculated) : 68.4  Vital Signs: Temp: 98 F (36.7 C) (06/29 1500)  Labs:  Recent Labs  05/08/14 0459 05/09/14 0451 05/09/14 1825 05/10/14 0540 05/10/14 1800  HGB 8.3* 7.5*  --  7.2*  --   HCT 27.2* 24.9*  --  23.7*  --   PLT 266 230  --  191  --   LABPROT 21.0* 34.4* 25.0* 19.3*  --   INR 1.81* 3.41* 2.26* 1.63*  --   HEPARINUNFRC  --   --   --   --  0.43  CREATININE 0.55 0.56  --  0.43*  --     Estimated Creatinine Clearance: 81.9 ml/min (by C-G formula based on Cr of 0.43).   Medical History: Past Medical History  Diagnosis Date  . Hypertension   . Heart murmur   . Peripheral vascular disease   . Arthritis   . Hepatitis 1954  . Mitral stenosis   . Aortic stenosis   . Atrial fibrillation   . Pericardial calcification     Assessment: 73 y/o M starting heparin now that INR is <2. Was on Lovenox/Warfarin bridge. INR is 1.63, Hgb 7.2 (has been in the 7-8 range).  Initial heparin level within desired range at 0.4, no bleeding issues noted.  Goal of Therapy:  Heparin level 0.3-0.7 units/ml Monitor platelets by anticoagulation protocol: Yes   Plan:  -Continue heparin drip at 1600 units/hr -Daily CBC/HL -Monitor for bleeding  62 PharmD., BCPS Clinical Pharmacist Pager [phone] 05/10/2014 7:12 PM

## 2014-05-10 NOTE — Progress Notes (Signed)
Met with Mr. Prevo who desired to have a a visit from a Jon Lozano, preferably from his hometown but this information he could not remember. Family relationships appear solid know they have been by to visit. Seeking emotional support.   cdb

## 2014-05-10 NOTE — Progress Notes (Signed)
Chaplain made promised follow-up visit to pt seen last week on 3E. Pt's wife was present. Since pt had expressed desire to reconnect with his [place]ina [place], I brought [employer] from yesterday. We reviewed parts of the service including the Creed and the Confession. I had understood from [employer] (who saw pt earlier today) that pt wanted to make contact with a Secretary/administrator, but when I offered to call one the pt said no. Apparently having a Tax adviser is satisfactory for him for the present. I offered to come again, and both pt and his wife affirmed they would like that. I asked pt whether he had any other concerns he would like to talk about today and he said no.

## 2014-05-11 DIAGNOSIS — B372 Candidiasis of skin and nail: Secondary | ICD-10-CM

## 2014-05-11 DIAGNOSIS — R0989 Other specified symptoms and signs involving the circulatory and respiratory systems: Secondary | ICD-10-CM

## 2014-05-11 DIAGNOSIS — R0600 Dyspnea, unspecified: Secondary | ICD-10-CM

## 2014-05-11 DIAGNOSIS — M549 Dorsalgia, unspecified: Secondary | ICD-10-CM

## 2014-05-11 DIAGNOSIS — R0609 Other forms of dyspnea: Secondary | ICD-10-CM

## 2014-05-11 LAB — URINE CULTURE
Colony Count: 80000
Special Requests: NORMAL

## 2014-05-11 LAB — BASIC METABOLIC PANEL
BUN: 12 mg/dL (ref 6–23)
CHLORIDE: 97 meq/L (ref 96–112)
CO2: 34 meq/L — AB (ref 19–32)
Calcium: 9.2 mg/dL (ref 8.4–10.5)
Creatinine, Ser: 0.44 mg/dL — ABNORMAL LOW (ref 0.50–1.35)
GFR calc Af Amer: >90 mL/min (ref >90)
GFR calc non Af Amer: >90 mL/min (ref >90)
GLUCOSE: 98 mg/dL (ref 70–99)
Potassium: 3.6 mEq/L — ABNORMAL LOW (ref 3.7–5.3)
Sodium: 140 mEq/L (ref 137–147)

## 2014-05-11 LAB — PROTIME-INR
INR: 1.38 (ref 0.00–1.49)
Prothrombin Time: 17 seconds — ABNORMAL HIGH (ref 11.6–15.2)

## 2014-05-11 LAB — CBC
HEMATOCRIT: 24.8 % — AB (ref 39.0–52.0)
Hemoglobin: 7.5 g/dL — ABNORMAL LOW (ref 13.0–17.0)
MCH: 27.3 pg (ref 26.0–34.0)
MCHC: 30.2 g/dL (ref 30.0–36.0)
MCV: 90.2 fL (ref 78.0–100.0)
Platelets: 191 10*3/uL (ref 150–400)
RBC: 2.75 MIL/uL — AB (ref 4.22–5.81)
RDW: 18.1 % — ABNORMAL HIGH (ref 11.5–15.5)
WBC: 8.4 10*3/uL (ref 4.0–10.5)

## 2014-05-11 LAB — GLUCOSE, CAPILLARY
GLUCOSE-CAPILLARY: 103 mg/dL — AB (ref 70–99)
GLUCOSE-CAPILLARY: 107 mg/dL — AB (ref 70–99)
Glucose-Capillary: 103 mg/dL — ABNORMAL HIGH (ref 70–99)

## 2014-05-11 MED ORDER — MORPHINE SULFATE 2 MG/ML IJ SOLN
2.0000 mg | INTRAMUSCULAR | Status: DC | PRN
  Administered 2014-05-11: 2 mg via INTRAVENOUS
  Filled 2014-05-11: qty 1

## 2014-05-11 MED ORDER — MORPHINE SULFATE 4 MG/ML IJ SOLN
4.0000 mg | Freq: Once | INTRAMUSCULAR | Status: AC
  Administered 2014-05-11: 4 mg via INTRAVENOUS
  Filled 2014-05-11: qty 1

## 2014-05-11 MED ORDER — FLUCONAZOLE 200 MG PO TABS
200.0000 mg | ORAL_TABLET | Freq: Every day | ORAL | Status: DC
  Administered 2014-05-11 – 2014-05-12 (×2): 200 mg via ORAL
  Filled 2014-05-11 (×2): qty 1

## 2014-05-11 MED ORDER — MORPHINE SULFATE 4 MG/ML IJ SOLN
4.0000 mg | INTRAMUSCULAR | Status: DC | PRN
  Administered 2014-05-11: 4 mg via INTRAVENOUS
  Filled 2014-05-11: qty 1

## 2014-05-11 NOTE — Progress Notes (Signed)
Doctor and medical staff speaking with pt and pt's wife, son, and daughter about plan of care.

## 2014-05-11 NOTE — Progress Notes (Signed)
Progress Note from the Palliative Medicine Team at [facility]  Subjective:  -continued conversation with patient and his family regarding GOC and end of life wishes, daughter and son present at bedside  -Dr Radonna Ricker offered information on the limitation of treatment for infection, fact that he is a poor surgical candidate   -patient was able to share with his family that if there is no medical interventions to change the outcome, "I'm tired of laying in this bed", and "I'm in pain"  -all understand the overall poor prognosis and are in agreement to shift to a comfort path  -they are hopeful for an in patietn hospice facility, in hopes of promoting comfort, quality and dignity  -plan would be to discontinue antibiotics, labs and diagnostics and to solely focus on comfort once discharged,  With this plan in place  his prognosis is likely days to weeks    (Mrs Izola Price tells [place] she  was very active with [facility] in its early years)      Objective: No Known Allergies Scheduled Meds: . antiseptic oral rinse  15 mL Mouth Rinse q12n4p  . aspirin EC  81 mg Oral Daily  . bisoprolol  2.5 mg Oral Daily  . budesonide (PULMICORT) nebulizer solution  0.25 mg Nebulization BID  . collagenase   Topical Daily  . DAPTOmycin (CUBICIN)  IV  500 mg Intravenous Q24H  . feeding supplement (PRO-STAT SUGAR FREE 64)  30 mL Oral TID WC  . feeding supplement (RESOURCE BREEZE)  1 Container Oral Q24H  . fluconazole  200 mg Oral Daily  . folic acid  1 mg Oral Daily  . insulin aspart  0-15 Units Subcutaneous TID WC  . lactobacillus acidophilus  1 tablet Oral BID  . lidocaine  1 patch Transdermal Daily  . meropenem (MERREM) IV  1 g Intravenous 3 times per day  . mirtazapine  7.5 mg Oral QHS  . pantoprazole  40 mg Oral Q1200  . predniSONE  10 mg Oral Q breakfast  . senna  1 tablet Oral QHS  . tamsulosin  0.4 mg Oral Daily  . vitamin C  250 mg Oral TID WC   Continuous Infusions: . heparin 1,600  Units/hr (05/11/14 0144)   PRN Meds:.acetaminophen, levalbuterol, morphine injection, RESOURCE THICKENUP CLEAR  BP 121/85  Pulse 94  Temp(Src) 98.3 F (36.8 C) (Oral)  Resp 20  Ht 5\' 8"  (1.727 m)  Wt 76.6 kg (168 lb 14 oz)  BMI 25.68 kg/m2  SpO2 100%   PPS:30 % at best  Pain Location back pain, increases with any movement    Intake/Output Summary (Last 24 hours) at 05/11/14 1333 Last data filed at 05/11/14 0932  Gross per 24 hour  Intake 1090.13 ml  Output    200 ml  Net 890.13 ml      LBM: 05-10-14     Physical Exam:  General: ill appearing, NAD HEENT: Moist buccal membranes  Chest: Diminished throughout, scattered Wh and RH  CVS: RRR Abdomen: soft NT +BS  Ext: +2 edema BUE, noted bilateral foot drop (demonstrated PROM exercises)  Neuro: lethargic, engages in conversation at this time   Labs: CBC    Component Value Date/Time   WBC 8.4 05/11/2014 0552   RBC 2.75* 05/11/2014 0552   HGB 7.5* 05/11/2014 0552   HCT 24.8* 05/11/2014 0552   PLT 191 05/11/2014 0552   MCV 90.2 05/11/2014 0552   MCH 27.3 05/11/2014 0552   MCHC 30.2 05/11/2014 0552  RDW 18.1* 05/11/2014 0552   LYMPHSABS 0.9 05/05/2014 1205   MONOABS 0.6 05/05/2014 1205   EOSABS 0.1 05/05/2014 1205   BASOSABS 0.0 05/05/2014 1205    BMET    Component Value Date/Time   NA 140 05/11/2014 0552   K 3.6* 05/11/2014 0552   CL 97 05/11/2014 0552   CO2 34* 05/11/2014 0552   GLUCOSE 98 05/11/2014 0552   BUN 12 05/11/2014 0552   CREATININE 0.44* 05/11/2014 0552   CALCIUM 9.2 05/11/2014 0552   GFRNONAA >90 05/11/2014 0552   GFRAA >90 05/11/2014 0552    CMP     Component Value Date/Time   NA 140 05/11/2014 0552   K 3.6* 05/11/2014 0552   CL 97 05/11/2014 0552   CO2 34* 05/11/2014 0552   GLUCOSE 98 05/11/2014 0552   BUN 12 05/11/2014 0552   CREATININE 0.44* 05/11/2014 0552   CALCIUM 9.2 05/11/2014 0552   PROT 6.7 04/25/2014 1805   ALBUMIN 2.7* 04/25/2014 1805   AST 26 04/25/2014 1805   ALT 14 04/25/2014 1805   ALKPHOS 152*  04/25/2014 1805   BILITOT 0.8 04/25/2014 1805   GFRNONAA >90 05/11/2014 0552   GFRAA >90 05/11/2014 0552    Assessment and Plan: 1. Code Status:DNR/DNI-comfort is primary focus of care 2. Symptom Control:     Pain/Dyspnea:  Morphine IV 2 mg every 2 hrs prn      Anxiety: Ativan IV 1 mg every 4 hrs prn 3. Psycho/Social:  Emotional support offered to patient and his family, although all understand the overall  poor prognosis it is very difficult to accept patient's mortality.  Per his daughter "he had surgery to improve his quality of life and instead it took his life away" 4. Disposition:  Hopeful for in patient hospice facility    Time In Time Out Total Time Spent with Patient Total Overall Time  1145 1245 60 min 60 min    Greater than 50%  of this time was spent counseling and coordinating care related to the above assessment and plan.  Lorinda Creed NP  Palliative Medicine Team Team Phone # [phone] Pager [phone]  Dr Radonna Ricker 1

## 2014-05-11 NOTE — Progress Notes (Signed)
[facility] PROGRESS NOTE Interim History: 73 y.o. M who underwent back surgery 01/04/14, complicated by hardware infection with MRSA, VRE, and proteus bacteremia. He was initially on [facility], then d/c'd to SNF on 4/29. Hardware was removed and he was readmitted to [facility] on 5/11. Course further complicated by Klebsiella PNA on sputum culture. On 6/10, was transferred to CIR then 6/15 developed new fever, altered mental status, lethargy. PCCM was consulted. Wife requested to be transfer to [facility] as she want Dr. Loralie Champagne  Assessment/Plan: Acute resp failure with hypoxia: due to [facility]. Mild diastolic heart failure contributing along with COPD: - Continue antibiotics as dictated/recommended by ID;  - Will continue flutter valve/IS. - Continue pulmicort and PRN Xopenex. - D/c lasix, KVO IV Fluids. Pulmonary consulted appreciate assistance. - CXR: Increasing right basilar atelectasis and effusion   Fever due to abscess, measuring 2 x 5cm on L4-L5. - Change Vanc to daptomycin and meropenem and added diflucan. - has defervece - Blood cx's has been repeated on 6/22 and have remained negative. - MRI of spine as below. Consult IR for abscess aspirate, but high risk for aspirate and debridment. - Appreciate ID assistance,  ID to evaluate. - I have talked to today and relates he is tired and will like to move toward comfort care, he will like to his wife. - Add IV morphin for back pain, he know that the chance of making it through are slim to none.  COPD/Emphysema: currently: - No wheezing  - Per pulmonary service plan is to continue bronchial hygiene, pulmicort and PRN bronchodilators (xopenex given A. Fib)   Septic shock:  - BP stable. - Patient off pressors since 6/18. - Will monitor vital signs closely.  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 - Resume heparin.  RLE DVT:  - INR is sub-therapeutic,  start heparin, hold on aspirate. - Heparin per pharmacy.  Urinary retention: - Most likely associated with BPH  - Started on flomax approx 5 days ago. - Voiding trials were successful and foley has been removed.  GERD:  - continue PPI   RA: continue prednisone   Dysphagia/deconditioning:   -As per SPT rec's will continue nectar thick liquids and heart healthy diet.   Severe protein calorie malnutrition: will follow nutritional service rec's    Code Status: full  Family Communication: no family at bedside  Disposition Plan: SNF    Consultants:  PCCM  cardiology  Procedures: - 6/10 Doppler legs >> partial acute DVT Rt common femoral vein  - 6/16 Echo >> mild LVH, EF 65 to 70%, grade 3 diastolic dysfx, mild AS, mild/mod MS, mod LA dilation  - 6/16 CT chest >> no PE, atherosclerosis, emphysema with interstitial fibrosis, small pericardial effusions, Rt base consolidation, foci of calcification in pericardium  CULTURES:  - Blood 4/1 >>> neg  - Blood 5/26 >>> neg  - Blood ? Date >>> POS MRSA, VRE, PROTEUS.  - Sputum ? Date >>> POS KLEBSIELLA.  - Blood 6/15 >>>negative  - Urine 6/15 >>> negative  ANTIBIOTICS:  Cefepime ? >>> 5/15  vanc 6.25.2015>> Daptomycin >>> completed treatment 6/23 per ID)   Antibiotics: daptomycin and meropenem completed treatment 6/23) vanc and meropenm 6.24.2015  HPI/Subjective: He want to stop treatment.  Objective: Filed Vitals:   05/10/14 1953 05/11/14 0012 05/11/14 0400 05/11/14 0743  BP: 113/80 109/72 115/75 124/84  Pulse: 86 93 88 70  Temp: 98 F (36.7 C) 98.2 F (36.8  C) 98.2 F (36.8 C) 98.3 F (36.8 C)  TempSrc: Oral Oral Oral Oral  Resp: 17 17 16 17   Height:      Weight:   76.6 kg (168 lb 14 oz)   SpO2: 100% 100% 100% 98%    Intake/Output Summary (Last 24 hours) at 05/11/14 0747 Last data filed at 05/11/14 0600  Gross per 24 hour  Intake 1220.13 ml  Output    350 ml  Net 870.13 ml   Filed Weights   05/09/14  0347 05/10/14 0400 05/11/14 0400  Weight: 84.6 kg (186 lb 8.2 oz) 79.6 kg (175 lb 7.8 oz) 76.6 kg (168 lb 14 oz)    Exam:  General: in no acute distress. Slow to respond HEENT: No bruits, no goiter.  Heart: Regular rate and rhythm. Lungs: Good air movement, clear Abdomen: Soft, nontender.  Data Reviewed: Basic Metabolic Panel:  Recent Labs Lab 05/07/14 0507 05/08/14 0459 05/09/14 0451 05/10/14 0540 05/11/14 0552  NA 138 140 141 143 140  K 3.9 4.4 4.0 3.4* 3.6*  CL 94* 95* 94* 98 97  CO2 37* 36* 39* 35* 34*  GLUCOSE 101* 88 104* 94 98  BUN 14 13 14 11 12   CREATININE 0.54 0.55 0.56 0.43* 0.44*  CALCIUM 9.7 9.7 9.0 8.7 9.2   Liver Function Tests: No results found for this basename: AST, ALT, ALKPHOS, BILITOT, PROT, ALBUMIN,  in the last 168 hours No results found for this basename: LIPASE, AMYLASE,  in the last 168 hours No results found for this basename: AMMONIA,  in the last 168 hours CBC:  Recent Labs Lab 05/05/14 1205  05/07/14 0507 05/08/14 0459 05/09/14 0451 05/10/14 0540 05/11/14 0552  WBC  --   < > 6.8 9.7 10.8* 8.1 8.4  NEUTROABS 6.9  --   --   --   --   --   --   HGB  --   < > 7.6* 8.3* 7.5* 7.2* 7.5*  HCT  --   < > 24.9* 27.2* 24.9* 23.7* 24.8*  MCV  --   < > 86.5 88.3 88.0 90.5 90.2  PLT  --   < > 171 266 230 191 191  < > = values in this interval not displayed. Cardiac Enzymes: No results found for this basename: CKTOTAL, CKMB, CKMBINDEX, TROPONINI,  in the last 168 hours BNP (last 3 results)  Recent Labs  02/06/14 0605 02/15/14 0640 04/30/14 0953  PROBNP 2426.0* 1292.0* 1579.0*   CBG:  Recent Labs Lab 05/09/14 1627 05/09/14 2025 05/10/14 0758 05/10/14 1601 05/10/14 2159  GLUCAP 115* 125* 91 161* 138*    Recent Results (from the past 240 hour(s))  CULTURE, BLOOD (ROUTINE X 2)     Status: None   Collection Time    05/03/14  3:01 PM      Result Value Ref Range Status   Specimen Description BLOOD LEFT ARM   Final   Special  Requests BOTTLES DRAWN AEROBIC AND ANAEROBIC 10CC   Final   Culture  Setup Time     Final   Value: 05/03/2014 18:56     Performed at 05/05/14   Culture     Final   Value: NO GROWTH 5 DAYS     Performed at 05/05/2014   Report Status 05/09/2014 FINAL   Final  CULTURE, BLOOD (ROUTINE X 2)     Status: None   Collection Time    05/03/14  3:14 PM  Result Value Ref Range Status   Specimen Description BLOOD LEFT HAND   Final   Special Requests BOTTLES DRAWN AEROBIC ONLY 3CC   Final   Culture  Setup Time     Final   Value: 05/03/2014 18:58     Performed at [facility]   Culture     Final   Value: NO GROWTH 5 DAYS     Performed at [facility]   Report Status 05/09/2014 FINAL   Final  URINE CULTURE     Status: None   Collection Time    05/04/14 10:02 PM      Result Value Ref Range Status   Specimen Description URINE, CLEAN CATCH   Final   Special Requests NONE   Final   Culture  Setup Time     Final   Value: 05/05/2014 04:27     Performed at [facility] Count     Final   Value: NO GROWTH     Performed at [facility]   Culture     Final   Value: NO GROWTH     Performed at [facility]   Report Status 05/06/2014 FINAL   Final  CATH TIP CULTURE     Status: None   Collection Time    05/05/14  2:18 PM      Result Value Ref Range Status   Specimen Description CATH TIP   Final   Special Requests NONE   Final   Culture     Final   Value: NO GROWTH 2 DAYS     Performed at [facility]   Report Status 05/07/2014 FINAL   Final  CULTURE, BLOOD (ROUTINE X 2)     Status: None   Collection Time    05/06/14 12:05 PM      Result Value Ref Range Status   Specimen Description BLOOD RIGHT HAND   Final   Special Requests BOTTLES DRAWN AEROBIC ONLY 6CC   Final   Culture  Setup Time     Final   Value: 05/06/2014 18:53     Performed at [facility]   Culture     Final   Value:        BLOOD CULTURE  RECEIVED NO GROWTH TO DATE CULTURE WILL BE HELD FOR 5 DAYS BEFORE ISSUING A FINAL NEGATIVE REPORT     Performed at [facility]   Report Status PENDING   Incomplete  CULTURE, BLOOD (ROUTINE X 2)     Status: None   Collection Time    05/06/14 12:15 PM      Result Value Ref Range Status   Specimen Description BLOOD RIGHT FOREARM   Final   Special Requests BOTTLES DRAWN AEROBIC ONLY [facility]   Final   Culture  Setup Time     Final   Value: 05/06/2014 18:54     Performed at [facility]   Culture     Final   Value:        BLOOD CULTURE RECEIVED NO GROWTH TO DATE CULTURE WILL BE HELD FOR 5 DAYS BEFORE ISSUING A FINAL NEGATIVE REPORT     Performed at [facility]   Report Status PENDING   Incomplete  CULTURE, BLOOD (ROUTINE X 2)     Status: None   Collection Time    05/09/14  2:50 PM      Result Value Ref Range Status   Specimen Description BLOOD RIGHT  HAND   Final   Special Requests BOTTLES DRAWN AEROBIC ONLY 8CC   Final   Culture  Setup Time     Final   Value: 05/09/2014 22:50     Performed at [facility]   Culture     Final   Value:        BLOOD CULTURE RECEIVED NO GROWTH TO DATE CULTURE WILL BE HELD FOR 5 DAYS BEFORE ISSUING A FINAL NEGATIVE REPORT     Performed at [facility]   Report Status PENDING   Incomplete  MRSA PCR SCREENING     Status: None   Collection Time    05/09/14  3:49 PM      Result Value Ref Range Status   MRSA by PCR NEGATIVE  NEGATIVE Final   Comment:            The GeneXpert MRSA Assay (FDA     approved for NASAL specimens     only), is one component of a     comprehensive MRSA colonization     surveillance program. It is not     intended to diagnose MRSA     infection nor to guide or     monitor treatment for     MRSA infections.  CULTURE, BLOOD (ROUTINE X 2)     Status: None   Collection Time    05/09/14  4:20 PM      Result Value Ref Range Status   Specimen Description BLOOD RIGHT ANKLE   Final   Special  Requests BOTTLES DRAWN AEROBIC AND ANAEROBIC 5CC   Final   Culture  Setup Time     Final   Value: 05/09/2014 22:50     Performed at [facility]   Culture     Final   Value:        BLOOD CULTURE RECEIVED NO GROWTH TO DATE CULTURE WILL BE HELD FOR 5 DAYS BEFORE ISSUING A FINAL NEGATIVE REPORT     Performed at [facility]   Report Status PENDING   Incomplete  URINE CULTURE     Status: None   Collection Time    05/09/14  7:44 PM      Result Value Ref Range Status   Specimen Description URINE, RANDOM   Final   Special Requests Normal   Final   Culture  Setup Time     Final   Value: 05/09/2014 20:43     Performed at [facility] Count     Final   Value: 80,000 COLONIES/ML     Performed at [facility]   Culture     Final   Value: Y     Performed at [facility]   Report Status 05/11/2014 FINAL   Final     Studies: Dg Chest Port 1 View  05/10/2014   CLINICAL DATA:  Difficulty breathing  EXAM: PORTABLE CHEST - 1 VIEW  COMPARISON:  05/08/2014  FINDINGS: A left-sided PICC line is again noted in the mid superior vena cava. Bibasilar changes are noted slightly worse on the right than the left. Small right-sided effusion is noted as well. These changes of increased in the interval from the prior exam particularly in the right lung base.  IMPRESSION: Increasing right basilar atelectasis and effusion.   Electronically Signed   By: Alcide Clever M.D.   On: 05/10/2014 07:50    Scheduled Meds: . antiseptic oral rinse  15 mL Mouth Rinse  [id number]  . aspirin EC  81 mg Oral Daily  . bisoprolol  2.5 mg Oral Daily  . budesonide (PULMICORT) nebulizer solution  0.25 mg Nebulization BID  . collagenase   Topical Daily  . DAPTOmycin (CUBICIN)  IV  500 mg Intravenous Q24H  . feeding supplement (PRO-STAT SUGAR FREE 64)  30 mL Oral TID WC  . feeding supplement (RESOURCE BREEZE)  1 Container Oral Q24H  . fluconazole (DIFLUCAN) IV  400 mg Intravenous Q24H  .  folic acid  1 mg Oral Daily  . insulin aspart  0-15 Units Subcutaneous TID WC  . lactobacillus acidophilus  1 tablet Oral BID  . lidocaine  1 patch Transdermal Daily  . meropenem (MERREM) IV  1 g Intravenous 3 times per day  . mirtazapine  7.5 mg Oral QHS  . multivitamin with minerals  1 tablet Oral Daily  . nutrition supplement (JUVEN)  1 packet Oral BID BM  . pantoprazole  40 mg Oral Q1200  . predniSONE  10 mg Oral Q breakfast  . pregabalin  100 mg Oral TID  . senna  1 tablet Oral QHS  . sodium chloride  10-40 mL Intracatheter Q12H  . tamsulosin  0.4 mg Oral Daily  . vitamin C  250 mg Oral TID WC   Continuous Infusions: . heparin 1,600 Units/hr (05/11/14 0144)     Marinda ElkFELIZ ORTIZ, Corday Wyka  [facility] Pager [phone]. If 8PM-8AM, please contact night-coverage at [email or web address], password [email or web address] 05/11/2014, 7:47 AM  LOS: 15 days    **Disclaimer: This note may have been dictated with voice recognition software. Similar sounding words can inadvertently be transcribed and this note may contain transcription errors which may not have been corrected upon publication of note.**

## 2014-05-11 NOTE — Progress Notes (Signed)
Physical Therapy Treatment/Discharge Summary Patient Details Name: Jon Lozano MRN: [id number] DOB: [birth year] Today's Date: 05-15-14    History of Present Illness Pt originally had back surgery in [place] in February complicated by hardware infection with MRSA, VRE, and proteus bacteremia. Pt was transferred to [facility] and then dc'd to SNF on 03/10/14. Pt readmitted to [facility] on 03/22/14 after removal of hardware. Transferred to CIR on 04/21/14. Developed new fever, lethargy, and altered mental status on 04/26/14 and transferred to ICU.Pt with HCAP and septic shock.    PT Comments    Patient and family now choosing comfort measures and feel staff and family can continue ROM activities.  Needs lift for OOB to chair.  No further skilled PT needs at this time.  Will d/c PT.  Follow Up Recommendations  SNF;Supervision/Assistance - 24 hour     Equipment Recommendations  None recommended by PT    Recommendations for Other Services       Precautions / Restrictions Precautions Precautions: Fall    Mobility  Bed Mobility               General bed mobility comments: NT; family requesting ROM in bed only  Transfers                    Ambulation/Gait                 Stairs            Wheelchair Mobility    Modified Rankin (Stroke Patients Only)       Balance                                    Cognition Arousal/Alertness: Lethargic Behavior During Therapy: Flat affect Overall Cognitive Status: Difficult to assess                      Exercises General Exercises - Upper Extremity Shoulder Flexion: AAROM;Both;5 reps;Supine Shoulder Horizontal ABduction: AAROM;Both;5 reps;Supine Shoulder Horizontal ADduction: AAROM;Both;5 reps;Supine Elbow Flexion: AAROM;Both;5 reps;Supine Elbow Extension: AAROM;Both;5 reps;Supine General Exercises - Lower Extremity Ankle Circles/Pumps: PROM;Both;5 reps;Supine (with hold for heel cord  stretch) Heel Slides: AAROM;Both;5 reps;Supine;PROM Hip ABduction/ADduction: PROM;Both;5 reps;Supine    General Comments        Pertinent Vitals/Pain Premedicated; indicated discomfort with ankle stretching.    Home Living                      Prior Function            PT Goals (current goals can now be found in the care plan section) Progress towards PT goals: Not progressing toward goals - comment (plan for d/c PT due to comfort measures)    Frequency  Other (Comment)    PT Plan Other (comment) (d/c PT plan for transition to Hospice home)    Co-evaluation             End of Session   Activity Tolerance: Patient limited by lethargy Patient left: in bed;with family/visitor present     Time: 4098-1191 PT Time Calculation (min): 25 min  Charges:  $Therapeutic Exercise: 8-22 mins $Self Care/Home Management: 8-22                    G Codes:      WYNN,CYNDI 05/15/14, 4:40 PM Sheran Lawless, PT [phone] 05/15/2014

## 2014-05-11 NOTE — Progress Notes (Signed)
Patient ID: Jon Lozano, male   DOB: 1941-02-23, 73 y.o.   MRN: [id number]         [facility]    Date of Admission:  04/26/2014   Prolonged antibiotic therapy        Day 15 meropenem        Day 4 cubicin  Active Problems:   Sepsis   Hypotension   Encephalopathy acute   Mitral stenosis   Aortic stenosis   Atrial fibrillation   Pericardial calcification   Diskitis   Osteoporosis, unspecified   Palliative care encounter   Weakness generalized   . antiseptic oral rinse  15 mL Mouth Rinse q12n4p  . aspirin EC  81 mg Oral Daily  . bisoprolol  2.5 mg Oral Daily  . budesonide (PULMICORT) nebulizer solution  0.25 mg Nebulization BID  . collagenase   Topical Daily  . DAPTOmycin (CUBICIN)  IV  500 mg Intravenous Q24H  . feeding supplement (PRO-STAT SUGAR FREE 64)  30 mL Oral TID WC  . feeding supplement (RESOURCE BREEZE)  1 Container Oral Q24H  . fluconazole (DIFLUCAN) IV  400 mg Intravenous Q24H  . folic acid  1 mg Oral Daily  . insulin aspart  0-15 Units Subcutaneous TID WC  . lactobacillus acidophilus  1 tablet Oral BID  . lidocaine  1 patch Transdermal Daily  . meropenem (MERREM) IV  1 g Intravenous 3 times per day  . mirtazapine  7.5 mg Oral QHS  . nutrition supplement (JUVEN)  1 packet Oral BID BM  . pantoprazole  40 mg Oral Q1200  . predniSONE  10 mg Oral Q breakfast  . senna  1 tablet Oral QHS  . tamsulosin  0.4 mg Oral Daily  . vitamin C  250 mg Oral TID WC    Subjective: He is complaining of increasing back pain. No diarrhea reported  Objective: Temp:  [97.9 F (36.6 C)-98.3 F (36.8 C)] 98.3 F (36.8 C) (06/30 0743) Pulse Rate:  [70-93] 70 (06/30 0743) Resp:  [16-17] 17 (06/30 0743) BP: (109-124)/(72-84) 124/84 mmHg (06/30 0743) SpO2:  [98 %-100 %] 100 % (06/30 0803) Weight:  [168 lb 14 oz (76.6 kg)] 168 lb 14 oz (76.6 kg) (06/30 0400)  General: Awake. He responds slowly but appropriately to questions Skin: No rash. Left  arm PICC site appears normal Lungs: Clear Cor: Regular S1 and S2 with no murmurs Abdomen: Soft and nontender. No diarrhea Joints extremities: Diffuse right arm swelling. Old PICC site looks good. Multiple covered wounds on the legs. Back:He has diffuse redness over his back and buttocks compatible with candidal infection. Wounds covered  Lab Results Lab Results  Component Value Date   WBC 8.4 05/11/2014   HGB 7.5* 05/11/2014   HCT 24.8* 05/11/2014   MCV 90.2 05/11/2014   PLT 191 05/11/2014    Lab Results  Component Value Date   CREATININE 0.44* 05/11/2014   BUN 12 05/11/2014   NA 140 05/11/2014   K 3.6* 05/11/2014   CL 97 05/11/2014   CO2 34* 05/11/2014    Lab Results  Component Value Date   ALT 14 04/25/2014   AST 26 04/25/2014   ALKPHOS 152* 04/25/2014   BILITOT 0.8 04/25/2014      Microbiology: Recent Results (from the past 240 hour(s))  CULTURE, BLOOD (ROUTINE X 2)     Status: None   Collection Time    05/03/14  3:01 PM      Result Value Ref  Range Status   Specimen Description BLOOD LEFT ARM   Final   Special Requests BOTTLES DRAWN AEROBIC AND ANAEROBIC 10CC   Final   Culture  Setup Time     Final   Value: 05/03/2014 18:56     Performed at [facility]   Culture     Final   Value: NO GROWTH 5 DAYS     Performed at [facility]   Report Status 05/09/2014 FINAL   Final  CULTURE, BLOOD (ROUTINE X 2)     Status: None   Collection Time    05/03/14  3:14 PM      Result Value Ref Range Status   Specimen Description BLOOD LEFT HAND   Final   Special Requests BOTTLES DRAWN AEROBIC ONLY 3CC   Final   Culture  Setup Time     Final   Value: 05/03/2014 18:58     Performed at [facility]   Culture     Final   Value: NO GROWTH 5 DAYS     Performed at [facility]   Report Status 05/09/2014 FINAL   Final  URINE CULTURE     Status: None   Collection Time    05/04/14 10:02 PM      Result Value Ref Range Status   Specimen Description URINE, CLEAN  CATCH   Final   Special Requests NONE   Final   Culture  Setup Time     Final   Value: 05/05/2014 04:27     Performed at [facility] Count     Final   Value: NO GROWTH     Performed at [facility]   Culture     Final   Value: NO GROWTH     Performed at [facility]   Report Status 05/06/2014 FINAL   Final  CATH TIP CULTURE     Status: None   Collection Time    05/05/14  2:18 PM      Result Value Ref Range Status   Specimen Description CATH TIP   Final   Special Requests NONE   Final   Culture     Final   Value: NO GROWTH 2 DAYS     Performed at [facility]   Report Status 05/07/2014 FINAL   Final  CULTURE, BLOOD (ROUTINE X 2)     Status: None   Collection Time    05/06/14 12:05 PM      Result Value Ref Range Status   Specimen Description BLOOD RIGHT HAND   Final   Special Requests BOTTLES DRAWN AEROBIC ONLY 6CC   Final   Culture  Setup Time     Final   Value: 05/06/2014 18:53     Performed at [facility]   Culture     Final   Value:        BLOOD CULTURE RECEIVED NO GROWTH TO DATE CULTURE WILL BE HELD FOR 5 DAYS BEFORE ISSUING A FINAL NEGATIVE REPORT     Performed at [facility]   Report Status PENDING   Incomplete  CULTURE, BLOOD (ROUTINE X 2)     Status: None   Collection Time    05/06/14 12:15 PM      Result Value Ref Range Status   Specimen Description BLOOD RIGHT FOREARM   Final   Special Requests BOTTLES DRAWN AEROBIC ONLY [facility]   Final   Culture  Setup Time  Final   Value: 05/06/2014 18:54     Performed at [facility]   Culture     Final   Value:        BLOOD CULTURE RECEIVED NO GROWTH TO DATE CULTURE WILL BE HELD FOR 5 DAYS BEFORE ISSUING A FINAL NEGATIVE REPORT     Performed at [facility]   Report Status PENDING   Incomplete  CULTURE, BLOOD (ROUTINE X 2)     Status: None   Collection Time    05/09/14  2:50 PM      Result Value Ref Range Status   Specimen Description BLOOD  RIGHT HAND   Final   Special Requests BOTTLES DRAWN AEROBIC ONLY 8CC   Final   Culture  Setup Time     Final   Value: 05/09/2014 22:50     Performed at [facility]   Culture     Final   Value:        BLOOD CULTURE RECEIVED NO GROWTH TO DATE CULTURE WILL BE HELD FOR 5 DAYS BEFORE ISSUING A FINAL NEGATIVE REPORT     Performed at [facility]   Report Status PENDING   Incomplete  MRSA PCR SCREENING     Status: None   Collection Time    05/09/14  3:49 PM      Result Value Ref Range Status   MRSA by PCR NEGATIVE  NEGATIVE Final   Comment:            The GeneXpert MRSA Assay (FDA     approved for NASAL specimens     only), is one component of a     comprehensive MRSA colonization     surveillance program. It is not     intended to diagnose MRSA     infection nor to guide or     monitor treatment for     MRSA infections.  CULTURE, BLOOD (ROUTINE X 2)     Status: None   Collection Time    05/09/14  4:20 PM      Result Value Ref Range Status   Specimen Description BLOOD RIGHT ANKLE   Final   Special Requests BOTTLES DRAWN AEROBIC AND ANAEROBIC 5CC   Final   Culture  Setup Time     Final   Value: 05/09/2014 22:50     Performed at [facility]   Culture     Final   Value:        BLOOD CULTURE RECEIVED NO GROWTH TO DATE CULTURE WILL BE HELD FOR 5 DAYS BEFORE ISSUING A FINAL NEGATIVE REPORT     Performed at [facility]   Report Status PENDING   Incomplete  URINE CULTURE     Status: None   Collection Time    05/09/14  7:44 PM      Result Value Ref Range Status   Specimen Description URINE, RANDOM   Final   Special Requests Normal   Final   Culture  Setup Time     Final   Value: 05/09/2014 20:43     Performed at [facility] Count     Final   Value: 80,000 COLONIES/ML     Performed at [facility]   Culture     Final   Value: Y     Performed at [facility]   Report Status 05/11/2014 FINAL   Final     Studies/Results: Dg Chest [employer]  05/10/2014   CLINICAL DATA:  Difficulty breathing  EXAM: PORTABLE CHEST - 1 VIEW  COMPARISON:  05/08/2014  FINDINGS: A left-sided PICC line is again noted in the mid superior vena cava. Bibasilar changes are noted slightly worse on the right than the left. Small right-sided effusion is noted as well. These changes of increased in the interval from the prior exam particularly in the right lung base.  IMPRESSION: Increasing right basilar atelectasis and effusion.   Electronically Signed   By: Alcide Clever M.D.   On: 05/10/2014 07:50    Assessment: 72yo M with complex back surgery c/b SSI infection poor wound healing, previously on long term antibiotics including daptomycin, meropenem. He had recurrence of high fever of 102.8 on 6/27, in setting of new picc line. MRI imaging suggest still ongoing deep fluid collection at laminectomy bed concern for abscess. Poor surgical candidate.   Plan: 1. Paraspinal fluid abscess recommend to treat with 4-6 wks with  Meropenem IV, daptomycin IV. While on daptomycin, he will need weekly CK and BMP. Which ever facility he will resides should he decide to continue antibiotics, he will need repeat mri of spine in 4-6 wk to decide how much further they would want antibiotics to be continued. Caution to giving IV antibiotics > 8wks 2. Candidal skin infection = can treat with 2 wks of oral fluconazole 3. Overall prognosis is quite poor due to this being the 3rd trial of long term antibiotics and the source unable to be debrided due to patient's deconditioning, malnutrition, and risk of surgery being life threatening. 4. Right arm swelling, if it appears worsening, consider doing u/s to rule out dvt  Will sign off   Judyann Munson, MD [facility] [phone] pager   [phone] cell 05/11/2014, 9:18 AM

## 2014-05-11 NOTE — Progress Notes (Signed)
CSW informed by hospice RN that family wants pt to go to Hospice at [facility]. CSW made referral and explained that pt's wife was involved with founding the facility. Facility reviewing referral now and calling pt's wife to talk about admittance. CSW left voicemail for pt's wife explaining referral has been made and inviting pt's wife to call with follow-up/questions.   Maryclare Labrador, MSW, [facility] Clinical Social Worker [phone]

## 2014-05-11 NOTE — Progress Notes (Addendum)
NUTRITION FOLLOW UP Intervention:    Supportive vs comfort care -- if supportive care desired, recommend EN support initiation  Continue Resource Breeze daily, Prostat liquid protein 30 ml TID with meals RD to follow for nutrition care plan  Nutrition Dx:   Increased nutrient needs related to wound healing as evidenced by estimated nutrition needs, ongoing  Goal:   Pt to meet >/= 90% of their estimated nutrition needs vs comfort care, pending  Monitor:   PO & supplemental intake, weight, labs, I/O's  Assessment:   73 y.o. M who underwent back surgery 01/04/14, complicated by hardware infection with MRSA, VRE, and proteus bacteremia. He was initially on [facility], then d/c'd to SNF on 4/29. Hardware was removed and he was readmitted to [facility] on 5/11. Course further complicated by Klebsiella PNA on sputum culture. On 6/10, was transferred to CIR then 6/15 developed new fever, altered mental status, lethargy.   Patient sleeping upon RD visit.  PO intake very poor at 0-25% per flowsheet records.  Per RN, taking some of his oral nutrition supplements.   Palliative Care Team meeting today for goals of care.  Height: Ht Readings from Last 1 Encounters:  04/29/14 5\' 8"  (1.727 m)    Weight Status:   Wt Readings from Last 1 Encounters:  05/11/14 168 lb 14 oz (76.6 kg)    Re-estimated needs:  Kcal: 1900-2100  Protein: 105-115 gm  Fluid: 1.9-2.1 L  Skin: open wound on back  Diet Order: Heart Healthy/Carbohydrate Modified    Intake/Output Summary (Last 24 hours) at 05/11/14 1054 Last data filed at 05/11/14 0932  Gross per 24 hour  Intake 1090.13 ml  Output    350 ml  Net 740.13 ml   Labs:   Recent Labs Lab 05/09/14 0451 05/10/14 0540 05/11/14 0552  NA 141 143 140  K 4.0 3.4* 3.6*  CL 94* 98 97  CO2 39* 35* 34*  BUN 14 11 12   CREATININE 0.56 0.43* 0.44*  CALCIUM 9.0 8.7 9.2  GLUCOSE 104* 94 98    CBG (last 3)   Recent Labs  05/10/14 1601 05/10/14 2159 05/11/14 0742   GLUCAP 161* 138* 103*    Scheduled Meds: . antiseptic oral rinse  15 mL Mouth Rinse q12n4p  . aspirin EC  81 mg Oral Daily  . bisoprolol  2.5 mg Oral Daily  . budesonide (PULMICORT) nebulizer solution  0.25 mg Nebulization BID  . collagenase   Topical Daily  . DAPTOmycin (CUBICIN)  IV  500 mg Intravenous Q24H  . feeding supplement (PRO-STAT SUGAR FREE 64)  30 mL Oral TID WC  . feeding supplement (RESOURCE BREEZE)  1 Container Oral Q24H  . fluconazole  200 mg Oral Daily  . folic acid  1 mg Oral Daily  . insulin aspart  0-15 Units Subcutaneous TID WC  . lactobacillus acidophilus  1 tablet Oral BID  . lidocaine  1 patch Transdermal Daily  . meropenem (MERREM) IV  1 g Intravenous 3 times per day  . mirtazapine  7.5 mg Oral QHS  . nutrition supplement (JUVEN)  1 packet Oral BID BM  . pantoprazole  40 mg Oral Q1200  . predniSONE  10 mg Oral Q breakfast  . senna  1 tablet Oral QHS  . tamsulosin  0.4 mg Oral Daily  . vitamin C  250 mg Oral TID WC    Continuous Infusions: . heparin 1,600 Units/hr (05/11/14 0144)    05/13/14, RD, LDN Pager #: [phone] After-Hours Pager #:  [phone]   

## 2014-05-12 LAB — CULTURE, BLOOD (ROUTINE X 2)
CULTURE: NO GROWTH
Culture: NO GROWTH

## 2014-05-12 LAB — HEPARIN LEVEL (UNFRACTIONATED): Heparin Unfractionated: 0.51 IU/mL (ref 0.30–0.70)

## 2014-05-12 LAB — GLUCOSE, CAPILLARY
GLUCOSE-CAPILLARY: 101 mg/dL — AB (ref 70–99)
GLUCOSE-CAPILLARY: 135 mg/dL — AB (ref 70–99)
Glucose-Capillary: 88 mg/dL (ref 70–99)
Glucose-Capillary: 152 mg/dL — ABNORMAL HIGH (ref 70–99)

## 2014-05-12 LAB — PROTIME-INR
INR: 1.49 (ref 0.00–1.49)
PROTHROMBIN TIME: 18 s — AB (ref 11.6–15.2)

## 2014-05-12 MED ORDER — BUDESONIDE 0.25 MG/2ML IN SUSP: 0.2500 mg | Freq: Two times a day (BID) | RESPIRATORY_TRACT | Status: AC

## 2014-05-12 MED ORDER — MORPHINE SULFATE 2 MG/ML IJ SOLN: 2.0000 mg | INTRAMUSCULAR | Status: AC | PRN

## 2014-05-12 MED ORDER — ACETAMINOPHEN 325 MG PO TABS: 650.0000 mg | ORAL_TABLET | Freq: Four times a day (QID) | ORAL | Status: AC | PRN

## 2014-05-12 MED ORDER — BISOPROLOL FUMARATE 5 MG PO TABS: 2.5000 mg | ORAL_TABLET | Freq: Every day | ORAL | Status: AC

## 2014-05-12 MED ORDER — LEVALBUTEROL HCL 0.63 MG/3ML IN NEBU: 0.6300 mg | INHALATION_SOLUTION | Freq: Four times a day (QID) | RESPIRATORY_TRACT | Status: AC | PRN

## 2014-05-12 MED ORDER — LORAZEPAM 2 MG/ML IJ SOLN: 1.0000 mg | INTRAMUSCULAR | Status: AC | PRN

## 2014-05-12 MED ORDER — COLLAGENASE 250 UNIT/GM EX OINT: TOPICAL_OINTMENT | Freq: Every day | CUTANEOUS | Status: AC

## 2014-05-12 MED ORDER — LEVALBUTEROL HCL 0.63 MG/3ML IN NEBU
0.6300 mg | INHALATION_SOLUTION | Freq: Three times a day (TID) | RESPIRATORY_TRACT | Status: DC
  Administered 2014-05-12: 0.63 mg via RESPIRATORY_TRACT
  Filled 2014-05-12: qty 3

## 2014-05-12 NOTE — Progress Notes (Signed)
ANTICOAGULATION CONSULT NOTE   Pharmacy Consult for Heparin  Indication: DVT  No Known Allergies  Patient Measurements: Height: 5\' 8"  (172.7 cm) Weight: 168 lb 14 oz (76.6 kg) IBW/kg (Calculated) : 68.4  Vital Signs: Temp: 97.5 F (36.4 C) (07/01 0817) Temp src: Oral (07/01 0817) BP: 104/58 mmHg (07/01 0817) Pulse Rate: 94 (07/01 0817)  Labs:  Recent Labs  05/10/14 0540 05/10/14 1800 05/11/14 0552 05/12/14 0515  HGB 7.2*  --  7.5*  --   HCT 23.7*  --  24.8*  --   PLT 191  --  191  --   LABPROT 19.3*  --  17.0* 18.0*  INR 1.63*  --  1.38 1.49  HEPARINUNFRC  --  0.43  --  0.51  CREATININE 0.43*  --  0.44*  --     Estimated Creatinine Clearance: 81.9 ml/min (by C-G formula based on Cr of 0.44).   Medical History: Past Medical History  Diagnosis Date  . Hypertension   . Heart murmur   . Peripheral vascular disease   . Arthritis   . Hepatitis 1953  . Mitral stenosis   . Aortic stenosis   . Atrial fibrillation   . Pericardial calcification     Assessment: 73 y/o M on heparin for DVT and heparin level noted at goal (HL = 0.51). Coumadin has been on hold for IR procedure and now noted Palliative Care plans for hospice and focus on comfort once discharged.  Goal of Therapy:  Heparin level 0.3-0.7 units/ml Monitor platelets by anticoagulation protocol: Yes   Plan:  -No heparin dose changes needed -Will follow patient care plan -Daily CBC/HL -Monitor for bleeding  73, Pharm D 05/12/2014 9:13 AM

## 2014-05-12 NOTE — Progress Notes (Signed)
Report given to RN at [facility].  Updated on patient history, current status, and plan of care.  Family is at patient's bedside at this time.  Patient is stable and able to transfer when Sharin Mons is available.  Will continue to monitor.

## 2014-05-12 NOTE — Discharge Summary (Signed)
Physician Discharge Summary  Jon Lozano:[id number] DOB: 1941/10/09 DOA: 04/26/2014  PCP: Pcp Not In System  Admit date: 04/26/2014 Discharge date: 05/12/2014  Time spent: >30 minutes  Recommendations for Outpatient Follow-up:  Full comfort care Continue adjusting medications and route as needed to achieve main goal Foley in place due to urinary retention, to preserved skin integrity and to provide comfort Patient discharged with IV line to make easier medication administration Comfort feeding  Discharge Diagnoses:  Active Problems:   Sepsis   Hypotension   Encephalopathy acute   Mitral stenosis   Aortic stenosis   Atrial fibrillation   Pericardial calcification   Diskitis   Osteoporosis, unspecified   Palliative care encounter   Weakness generalized   Dyspnea   Pain in back   Discharge Condition: patient is currently no complaining of severe SOB or CP; back pain pain is controlled and overall comfortable.  Diet recommendation: comfort feeding  Filed Weights   05/10/14 0400 05/11/14 0400 05/12/14 0400  Weight: 79.6 kg (175 lb 7.8 oz) 76.6 kg (168 lb 14 oz) 76.6 kg (168 lb 14 oz)    History of present illness:  73 y.o. M who underwent back surgery 01/04/14, complicated by hardware infection with MRSA, VRE, and proteus bacteremia. He was initially on [facility], then d/c'd to SNF on 4/29. Hardware was removed and he was readmitted to [facility] on 5/11. Course further complicated by Klebsiella PNA on sputum culture. On 6/10, was transferred to CIR then 6/15 developed new fever, altered mental status, lethargy. Rapid Response team called due to pt being minimally responsive with hypotension and bradycardia. Upon RRT arrival, pt noted to be bradycardic in 30's. 250cc bolus administered with improvement in HR to 80's and improvement in mental status. PCCM was consulted for transfer to ICU; patient with initial improvements and able to be off pressors and transfer out of the unit to [facility]  service.   Hospital Course:  1-acute resp failure with hypoxia: due to [facility]. Mild diastolic heart failure contributing along with COPD  -much better  -patient on 2L oxygen intermittently at this time and with good O2 sat  -continue full comfort approach; no more antibiotics, readmissions or any other invasive procedures -will continue flutter valve/IS  -continue pulmicort and PRN xopenex  -continue daily low dose lasix (intended to help breathing)  2-COPD/Emphysema: currently no wheezing  -per pulmonary service plan is to continue bronchial hygiene, pulmicort and PRN bronchodilators (xopenex given A. Fib)  -continue xopenex BID (PRN) and pulmicort  3-septic shock: now resolved  -BP stable and patient afebrile currently -patient off pressors since 6/18  -plan is for comfort care  4-atrial fibrillation and pericardial calcification/constrictive pericarditis:  -per cardiology; not a candidate for further evaluation or long term anticoagulation.  5-RLE DVT: plan is for full comfort care and no further anticoagulation therapy at this point.  6-Urinary retention: most likely associated with BPH  -continue flomax -foley replaced as patient developed subsequent urinary reten  7-GERD: continue PPI   8-RA: continue prednisone   9-acute toxic encephalopathy: due to infection  -now resolved  -patient AAOX3.   10-dysphagia/deconditioning: after discussing with family and patient himself; they wwould like to proceed with comfort feeding (taking risks of aspiration)   11-ongoing intermittent fever: discussed with ID (Dr. Ilsa Iha) and neurosurgery; options left for treatment are not guaranteed and so far has not made any difference; patient would like to stop current treatment and focus on full comfort care -recommendation to continue current abx's therapy  and to stop on 6/23 as initially recommended  -blood cx's has been repeated on 6/22 and negative -urine culture done on 6/23;  demonstrated > 80,000 colonies (no isolated microorganism)   12-severe protein calorie malnutrition:comfort feeding at Hospice facility   Procedures: 6/10 Doppler legs >> partial acute DVT Rt common femoral vein  6/16 Echo >> mild LVH, EF 65 to 70%, grade 3 diastolic dysfx, mild AS, mild/mod MS, mod LA dilation  6/16 CT chest >> no PE, atherosclerosis, emphysema with interstitial fibrosis, small pericardial effusions, Rt base consolidation, foci of calcification in pericardium  Consultations: PCCM  Cardiology  Palliative Care Neurosurgery   Discharge Exam: Filed Vitals:   05/12/14 1137  BP: 122/84  Pulse: 98  Temp: 97.8 F (36.6 C)  Resp: 17   General: Still spiking fever; NAD, denies CP, abd pain and nausea/vomiting. Patient reports breathing is stable  Cardiovascular: irregular, no rubs or gallops appreciated  Respiratory: no wheezing; decrease BS at bases; but no frank crackles on exam Abdomen: soft, NT, ND, positive BS  Musculoskeletal: bilateral trace edema; also with some swelling affecting upper extremities as well; no cyanosis  Discharge Instructions You were cared for by a hospitalist during your hospital stay. If you have any questions about your discharge medications or the care you received while you were in the hospital after you are discharged, you can call the unit and asked to speak with the hospitalist on call if the hospitalist that took care of you is not available. Once you are discharged, your primary care physician will handle any further medical issues. Please note that NO REFILLS for any discharge medications will be authorized once you are discharged, as it is imperative that you return to your primary care physician (or establish a relationship with a primary care physician if you do not have one) for your aftercare needs so that they can reassess your need for medications and monitor your lab values.  Discharge Instructions   Change dressing (specify)     Complete by:  As directed   Dressing change: *wet to dry (applying santyl with wet dressings) please do every 24 hours.     Discharge instructions    Complete by:  As directed   Full comfort care Continue adjusting medications and route as needed to achieve main goal Foley in place due to urinary retention, to preserved skin integrity and to provide comfort Patient discharged with IV line to make easier medication administration Comfort feeding            Medication List    STOP taking these medications       DAPTOmycin in sodium chloride 0.9 % 100 mL     meropenem 1 g in sodium chloride 0.9 % 100 mL     multivitamins ther. w/minerals Tabs tablet     oxycodone 5 MG capsule  Commonly known as:  OXY-IR     potassium chloride SA 20 MEQ tablet  Commonly known as:  K-DUR,KLOR-CON     pregabalin 100 MG capsule  Commonly known as:  LYRICA     zinc sulfate 220 MG capsule      TAKE these medications       acetaminophen 325 MG tablet  Commonly known as:  TYLENOL  Take 2 tablets (650 mg total) by mouth every 6 (six) hours as needed for mild pain, fever or headache.     aspirin 81 MG tablet  Take 81 mg by mouth daily.     bisoprolol 5  MG tablet  Commonly known as:  ZEBETA  Take 0.5 tablets (2.5 mg total) by mouth daily.     budesonide 0.25 MG/2ML nebulizer solution  Commonly known as:  PULMICORT  Take 2 mLs (0.25 mg total) by nebulization 2 (two) times daily.     clopidogrel 75 MG tablet  Commonly known as:  PLAVIX  Take 75 mg by mouth daily.     collagenase ointment  Commonly known as:  SANTYL  Apply topically daily.     feeding supplement (PRO-STAT SUGAR FREE 64) Liqd  Take 30 mLs by mouth 3 (three) times daily with meals.     ferrous sulfate 325 (65 FE) MG tablet  Take 325 mg by mouth 3 (three) times daily with meals.     folic acid 1 MG tablet  Commonly known as:  FOLVITE  Take 1 mg by mouth daily.     furosemide 20 MG tablet  Commonly known as:  LASIX   Take 20 mg by mouth 2 (two) times daily.     lactobacillus acidophilus Tabs tablet  Take 1 tablet by mouth 2 (two) times daily.     levalbuterol 0.63 MG/3ML nebulizer solution  Commonly known as:  XOPENEX  Take 3 mLs (0.63 mg total) by nebulization every 6 (six) hours as needed for wheezing or shortness of breath.     lidocaine 5 %  Commonly known as:  LIDODERM  Place 1 patch onto the skin daily. Remove & Discard patch within 12 hours or as directed by MD     LORazepam 2 MG/ML injection  Commonly known as:  ATIVAN  Inject 0.5 mLs (1 mg total) into the vein every 4 (four) hours as needed for anxiety (agitation).     mirtazapine 15 MG tablet  Commonly known as:  REMERON  Take 15 mg by mouth at bedtime.     morphine 2 MG/ML injection  Inject 1 mL (2 mg total) into the vein every 2 (two) hours as needed (pain).     MUCINEX MAXIMUM STRENGTH 1200 MG Tb12  Generic drug:  Guaifenesin  Take 1 tablet by mouth 2 (two) times daily.     pantoprazole 40 MG tablet  Commonly known as:  PROTONIX  Take 40 mg by mouth daily.     polyethylene glycol packet  Commonly known as:  MIRALAX / GLYCOLAX  Take 17 g by mouth 2 (two) times daily.     predniSONE 10 MG tablet  Commonly known as:  DELTASONE  Take 10 mg by mouth daily with breakfast.     senna 8.6 MG Tabs tablet  Commonly known as:  SENOKOT  Take 1 tablet by mouth at bedtime.     tamsulosin 0.4 MG Caps capsule  Commonly known as:  FLOMAX  Take 0.4 mg by mouth daily.     vitamin C 250 MG tablet  Commonly known as:  ASCORBIC ACID  Take 250 mg by mouth 3 (three) times daily with meals.       No Known Allergies    The results of significant diagnostics from this hospitalization (including imaging, microbiology, ancillary and laboratory) are listed below for reference.    Significant Diagnostic Studies: Dg Chest 1 View  04/22/2014   CLINICAL DATA:  PICC line placement  EXAM: CHEST - 1 VIEW  COMPARISON:  Portable exam 1548 hr  compared to 03/22/2014  FINDINGS: RIGHT arm PICC line tip projects over SVC.  Minimal enlargement of cardiac silhouette.  Mediastinal contours and pulmonary vascularity  normal.  Mild persistent LEFT basilar infiltrate.  Improving aeration at RIGHT base.  No pleural effusion or pneumothorax.  IMPRESSION: Tip of RIGHT arm PICC line projects over mid SVC.  Persistent LEFT basilar and improving RIGHT basilar pulmonary infiltrates.   Electronically Signed   By: Ulyses Southward M.D.   On: 04/22/2014 17:14   Dg Chest 2 View  04/26/2014   CLINICAL DATA:  Hypoxia.  Question CHF  EXAM: CHEST  2 VIEW  COMPARISON:  04/25/2014  FINDINGS: Right arm PICC tip in the SVC unchanged.  Diffusely increased lung markings unchanged from the prior study. Question mild CHF versus chronic lung disease. No significant effusion.  IMPRESSION: Prominent lung markings are stable and may be due to chronic CHF or chronic lung disease and fibrosis. No superimposed edema or effusion.   Electronically Signed   By: Marlan Palau M.D.   On: 04/26/2014 17:15   Dg Chest 2 View  04/25/2014   CLINICAL DATA:  Short of breath.  Fever.  Back pain.  EXAM: CHEST  2 VIEW  COMPARISON:  04/22/2014.  FINDINGS: Right upper extremity PICC is unchanged. The cardiopericardial silhouette remains enlarged. Bilateral basilar predominant airspace disease with diffuse interstitial prominence. Compared to the prior exam, there is no interval change aside from lower lung volumes. No pleural effusion.  IMPRESSION: 1. Unchanged right upper extremity PICC with the tip in the mid SVC. 2. Lower lung volumes with interstitial and alveolar basilar opacities.   Electronically Signed   By: Andreas Newport M.D.   On: 04/25/2014 19:27   Ct Angio Chest Pe W/cm &/or Wo Cm  04/27/2014   CLINICAL DATA:  Right lower extremity deep venous thrombosis  EXAM: CT ANGIOGRAPHY CHEST WITH CONTRAST  TECHNIQUE: Multidetector CT imaging of the chest was performed using the standard protocol during  bolus administration of intravenous contrast. Multiplanar CT image reconstructions and MIPs were obtained to evaluate the vascular anatomy.  CONTRAST:  80mL OMNIPAQUE IOHEXOL 350 MG/ML SOLN  COMPARISON:  Chest CT February 04, 2014 and chest radiograph April 27, 2014  FINDINGS: There is no demonstrable pulmonary embolus. There is atherosclerotic change in the aorta. There is no demonstrable thoracic aortic aneurysm or dissection.  There is underlying emphysema with areas of interstitial fibrosis. There are small pleural effusions with what appears to be some interstitial edema superimposed on interstitial fibrotic change. There is an area of opacity in the posterior segment right upper lobe which appears to represent rounded atelectasis. There may be some superimposed pneumonia in this area. There is an area of airspace consolidation in the posterior right base which appears new from the prior study and is felt to represent localized pneumonia in this area.  There is a small pericardial effusion. There are scattered foci of calcification within the pericardium. This finding is stable. The pericardium is also thickened on the left side with relative straightening of the left lateral heart border. There is the aortic valve and mitral annular calcification as well, stable from prior study.  There is no appreciable thoracic adenopathy. A borderline prominent superior right paratracheal lymph node is stable and most likely of reactive etiology.  The visualized upper abdominal structures appear normal except for atherosclerotic change. There are no blastic or lytic bone lesions. Thyroid appears normal.  Review of the MIP images confirms the above findings.  IMPRESSION: No demonstrable pulmonary embolus.  Evidence of congestive heart failure superimposed on interstitial fibrosis. Note that there is a small pericardial effusion as well as scattered  areas of pericardial calcification on the left. There is also extensive aortic  valve and mitral annulus calcification, stable. 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 There appears to be some straightening of the lateral left heart border near the pericardium, a finding that could be indicative of constrictive pericarditis. Echocardiography could be helpful to further assess in this regard. Note that this appearance is essentially unchanged from prior CT examination.  Suspect rounded atelectasis in the posterior segment right upper lobe. There may be some superimposed infiltrate in this area. This appearance suggesting rounded atelectasis was also present on the previous study and is stable. There is also an area of localized pneumonia in the right base posteriorly.   Electronically Signed   By: Bretta Bang M.D.   On: 04/27/2014 10:28   Mr Lumbar Spine W Wo Contrast  05/07/2014   CLINICAL DATA:  Wound on the back. Infection following spinal fusion attempt.  EXAM: MRI LUMBAR SPINE WITHOUT AND WITH CONTRAST  TECHNIQUE: Multiplanar and multiecho pulse sequences of the lumbar spine were obtained without and with intravenous contrast.  CONTRAST:  15mL MULTIHANCE GADOBENATE DIMEGLUMINE 529 MG/ML IV SOLN  COMPARISON:  Radiographs 03/17/2014. MRI 11/10/2013. Intraoperative fluoroscopy 01/04/2014.  FINDINGS: Segmentation: Numbering used on prior exam is preserved with 5 lumbar type vertebral bodies.  Alignment: Straightening of the normal lumbar lordosis with slight reversal centered around L2-L3. Grade I retrolisthesis of L4 on L5 measuring 4 mm.  Vertebrae: Discitis/osteomyelitis is present at L3-L4, with endplate edema and enhancement.There is irregularity the endplates which is new compared to the prior exam from 2014 compatible osteolysis from disc space infection. The L3-L4 disc appears liquified.  Postsurgical changes are present at L4-L5 and L5-S1. No compression fracture.  Screw tracts are present in the L4 through S1 vertebrae compatible  with removed rod and pedicle screw hardware.  Conus medullaris: Terminates at L1-L2, normal. Unchanged from prior.  Paraspinal tissues: Paraspinal muscular edema is most pronounced around the L3-L4 level but extends into both iliopsoas muscles. There is chronic LEFT psoas muscle atrophy. No psoas muscle abscess is identified. There is a RIGHT eccentric fluid collection dorsal to the laminectomies at L4 and L5 probably representing abscess. This measures 5.3 cm transverse by 1.8 cm AP and extends up to the posterior aspect of the thecal sac. On post gadolinium imaging, this demonstrates irregular peripheral enhancement, most suggestive of abscess. Areas of susceptibility artifact are present at the periphery. A spinal leak or seroma is considered less likely.  There are several areas of right-sided subcutaneous ulceration with cellulitis. Wound dressing material appears present in the dorsal back. On the sagittal post gadolinium imaging, the right-sided fluid collection appears to communicate with the superficial ulcerations although this is incompletely imaged (image 3 series 9).  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  Disc levels:  L1-L2: Moderate central stenosis associated with a central disc extrusion with cranial extension. There is central thickening of the posterior longitudinal ligament that extends to the L2-L3 disc space. This probably represents an old disc extrusion anterior to the posterior longitudinal ligament. The spinal canal is congenitally narrow. No change from prior. Foramina patent.  L2-L3: Disc degeneration and desiccation with loss of height. Broad-based posterior disc bulging. Mild central stenosis. Foramina appear adequately patent.  L3-L4: Liquefaction of the disc. Discitis/ osteomyelitis. Enhancing granulation tissue extends deep to the posterior longitudinal ligament dorsal to L4 from the disc space. No discrete epidural abscess. L3 laminectomy with recurrent  moderate  central stenosis. Moderate RIGHT and mild LEFT foraminal stenosis. Bilateral lateral recess stenosis.  L4-L5: Laminectomy. Central canal is decompressed. Discectomy with interbody bone graft. Enhancing granulation tissue present in the epidural space and around both neural foramina. RIGHT eccentric previously mentioned posterior fluid collection extending inferiorly.  L5-S1: Discectomy and laminectomy. Central canal and lateral recesses decompressed. Moderate bilateral foraminal stenosis. Epidural granulation tissue.  IMPRESSION: 1. Subcutaneous ulceration in the dorsal soft tissues of the back eccentric to the RIGHT. Deep fluid collection compatible with abscess in the laminectomy bed at L4-L5 with tract communicating with the superficial ulcerations to the RIGHT of midline. Tract is best visualized on sagittal image number 3 series 9. 2. Development of discitis/osteomyelitis at L3-L4 with moderate central stenosis. No definite epidural abscess. 3. L4-L5 and L5-S1 laminectomy with remove rod and pedicle screw hardware.   Electronically Signed   By: Andreas Newport M.D.   On: 05/07/2014 11:44   Dg Chest Port 1 View  05/10/2014   CLINICAL DATA:  Difficulty breathing  EXAM: PORTABLE CHEST - 1 VIEW  COMPARISON:  05/08/2014  FINDINGS: A left-sided PICC line is again noted in the mid superior vena cava. Bibasilar changes are noted slightly worse on the right than the left. Small right-sided effusion is noted as well. These changes of increased in the interval from the prior exam particularly in the right lung base.  IMPRESSION: Increasing right basilar atelectasis and effusion.   Electronically Signed   By: Alcide Clever M.D.   On: 05/10/2014 07:50   Dg Chest Port 1 View  05/08/2014   CLINICAL DATA:  Line placement.  EXAM: PORTABLE CHEST - 1 VIEW  COMPARISON:  Chest radiograph 05/02/2014  FINDINGS: Left upper extremity PICC line is present tip projecting over the superior vena cava. Stable cardiomegaly. Probable  small right pleural effusion. Minimal heterogeneous opacities right lung base. Minimal heterogeneous opacities left lung base. No definite pneumothorax.  IMPRESSION: Left upper extremity PICC line tip projects over the superior vena cava.  Probable small right pleural effusion and underlying opacities which may represent atelectasis.  Minimal left basilar atelectasis.   Electronically Signed   By: Annia Belt M.D.   On: 05/08/2014 17:09   Dg Chest Port 1 View  05/02/2014   CLINICAL DATA:  follow SOB  EXAM: PORTABLE CHEST - 1 VIEW  COMPARISON:  None.  FINDINGS: Low lung volumes. The heart size and mediastinal contours stable. Right central venous catheter via subclavian approach tip superior vena cava. There is increased density along the periphery of the right lung base. Mild prominence of the interstitial markings is appreciated as well as mild central peribronchial cuffing. Linear areas increased density within the lung bases. Degenerative changes within the shoulders. No acute osseous abnormalities. Both lungs are clear. The visualized skeletal structures are unremarkable.  IMPRESSION: Density along the peripheral base right hemithorax differential considerations atelectasis versus infiltrate possibly loculated effusion. Surveillance evaluation recommended with PA and lateral chest radiograph.  Pulmonary vascular congestion/ edema  Atelectasis versus scarring within the lung bases.   Electronically Signed   By: Salome Holmes M.D.   On: 05/02/2014 15:41   Dg Chest Port 1 View  04/29/2014   CLINICAL DATA:  Followup pneumonia  EXAM: PORTABLE CHEST - 1 VIEW  COMPARISON:  04/27/2014  FINDINGS: Mild left lower lobe atelectasis/ infiltrate unchanged. Right lung remains clear. Negative for heart failure or effusion. Right arm PICC tip in the SVC.  IMPRESSION: No significant change left lower lobe atelectasis/infiltrate  Electronically Signed   By: Marlan Palauharles  Clark M.D.   On: 04/29/2014 08:04   Dg Chest Port 1  View  04/27/2014   CLINICAL DATA:  Followup prominent lung markings  EXAM: PORTABLE CHEST - 1 VIEW  COMPARISON:  Portable exam 0620 hr compared to 04/26/2014  FINDINGS: RIGHT arm PICC line tip projects over SVC.  Rotated to the RIGHT.  Enlargement of cardiac silhouette.  Mediastinal contours and pulmonary vascularity normal.  Persistent accentuation of interstitial markings similar to previous exam.  Minimal infiltrate or atelectasis at LEFT base.  Lungs otherwise clear.  No pleural effusion or pneumothorax.  Chronic RIGHT rotator cuff tear.  IMPRESSION: Mild enlargement of cardiac silhouette.  Chronic accentuation of interstitial markings.  Minimal atelectasis versus infiltrate at LEFT base.   Electronically Signed   By: Ulyses SouthwardMark  Boles M.D.   On: 04/27/2014 08:14    Microbiology: Recent Results (from the past 240 hour(s))  CULTURE, BLOOD (ROUTINE X 2)     Status: None   Collection Time    05/03/14  3:01 PM      Result Value Ref Range Status   Specimen Description BLOOD LEFT ARM   Final   Special Requests BOTTLES DRAWN AEROBIC AND ANAEROBIC 10CC   Final   Culture  Setup Time     Final   Value: 05/03/2014 18:56     Performed at [facility]   Culture     Final   Value: NO GROWTH 5 DAYS     Performed at [facility]   Report Status 05/09/2014 FINAL   Final  CULTURE, BLOOD (ROUTINE X 2)     Status: None   Collection Time    05/03/14  3:14 PM      Result Value Ref Range Status   Specimen Description BLOOD LEFT HAND   Final   Special Requests BOTTLES DRAWN AEROBIC ONLY 3CC   Final   Culture  Setup Time     Final   Value: 05/03/2014 18:58     Performed at [facility]   Culture     Final   Value: NO GROWTH 5 DAYS     Performed at [facility]   Report Status 05/09/2014 FINAL   Final  URINE CULTURE     Status: None   Collection Time    05/04/14 10:02 PM      Result Value Ref Range Status   Specimen Description URINE, CLEAN CATCH   Final   Special Requests  NONE   Final   Culture  Setup Time     Final   Value: 05/05/2014 04:27     Performed at [facility]   Colony Count     Final   Value: NO GROWTH     Performed at [facility]   Culture     Final   Value: NO GROWTH     Performed at [facility]   Report Status 05/06/2014 FINAL   Final  CATH TIP CULTURE     Status: None   Collection Time    05/05/14  2:18 PM      Result Value Ref Range Status   Specimen Description CATH TIP   Final   Special Requests NONE   Final   Culture     Final   Value: NO GROWTH 2 DAYS     Performed at [facility]   Report Status 05/07/2014 FINAL   Final  CULTURE, BLOOD (ROUTINE X 2)  Status: None   Collection Time    05/06/14 12:05 PM      Result Value Ref Range Status   Specimen Description BLOOD RIGHT HAND   Final   Special Requests BOTTLES DRAWN AEROBIC ONLY [facility]   Final   Culture  Setup Time     Final   Value: 05/06/2014 18:53     Performed at [facility]   Culture     Final   Value: NO GROWTH 5 DAYS     Performed at [facility]   Report Status 05/12/2014 FINAL   Final  CULTURE, BLOOD (ROUTINE X 2)     Status: None   Collection Time    05/06/14 12:15 PM      Result Value Ref Range Status   Specimen Description BLOOD RIGHT FOREARM   Final   Special Requests BOTTLES DRAWN AEROBIC ONLY [facility]   Final   Culture  Setup Time     Final   Value: 05/06/2014 18:54     Performed at [facility]   Culture     Final   Value: NO GROWTH 5 DAYS     Performed at [facility]   Report Status 05/12/2014 FINAL   Final  CULTURE, BLOOD (ROUTINE X 2)     Status: None   Collection Time    05/09/14  2:50 PM      Result Value Ref Range Status   Specimen Description BLOOD RIGHT HAND   Final   Special Requests BOTTLES DRAWN AEROBIC ONLY [facility]   Final   Culture  Setup Time     Final   Value: 05/09/2014 22:50     Performed at [facility]   Culture     Final   Value:        BLOOD CULTURE  RECEIVED NO GROWTH TO DATE CULTURE WILL BE HELD FOR 5 DAYS BEFORE ISSUING A FINAL NEGATIVE REPORT     Performed at [facility]   Report Status PENDING   Incomplete  MRSA PCR SCREENING     Status: None   Collection Time    05/09/14  3:49 PM      Result Value Ref Range Status   MRSA by PCR NEGATIVE  NEGATIVE Final   Comment:            The GeneXpert MRSA Assay (FDA     approved for NASAL specimens     only), is one component of a     comprehensive MRSA colonization     surveillance program. It is not     intended to diagnose MRSA     infection nor to guide or     monitor treatment for     MRSA infections.  CULTURE, BLOOD (ROUTINE X 2)     Status: None   Collection Time    05/09/14  4:20 PM      Result Value Ref Range Status   Specimen Description BLOOD RIGHT ANKLE   Final   Special Requests BOTTLES DRAWN AEROBIC AND ANAEROBIC 5CC   Final   Culture  Setup Time     Final   Value: 05/09/2014 22:50     Performed at [facility]   Culture     Final   Value:        BLOOD CULTURE RECEIVED NO GROWTH TO DATE CULTURE WILL BE HELD FOR 5 DAYS BEFORE ISSUING A FINAL NEGATIVE REPORT     Performed at [facility]   Report Status PENDING   Incomplete  URINE CULTURE     Status: None   Collection Time    05/09/14  7:44 PM      Result Value Ref Range Status   Specimen Description URINE, RANDOM   Final   Special Requests Normal   Final   Culture  Setup Time     Final   Value: 05/09/2014 20:43     Performed at [facility] Count     Final   Value: 80,000 COLONIES/ML     Performed at [facility]   Culture     Final   Value: Y     Performed at [facility]   Report Status 05/11/2014 FINAL   Final     Labs: Basic Metabolic Panel:  Recent Labs Lab 05/07/14 0507 05/08/14 0459 05/09/14 0451 05/10/14 0540 05/11/14 0552  NA 138 140 141 143 140  K 3.9 4.4 4.0 3.4* 3.6*  CL 94* 95* 94* 98 97  CO2 37* 36* 39* 35* 34*  GLUCOSE  101* 88 104* 94 98  BUN 14 13 14 11 12   CREATININE 0.54 0.55 0.56 0.43* 0.44*  CALCIUM 9.7 9.7 9.0 8.7 9.2   CBC:  Recent Labs Lab 05/07/14 0507 05/08/14 0459 05/09/14 0451 05/10/14 0540 05/11/14 0552  WBC 6.8 9.7 10.8* 8.1 8.4  HGB 7.6* 8.3* 7.5* 7.2* 7.5*  HCT 24.9* 27.2* 24.9* 23.7* 24.8*  MCV 86.5 88.3 88.0 90.5 90.2  PLT 171 266 230 191 191   BNP (last 3 results)  Recent Labs  02/06/14 0605 02/15/14 0640 04/30/14 0953  PROBNP 2426.0* 1292.0* 1579.0*   CBG:  Recent Labs Lab 05/11/14 1233 05/11/14 1627 05/12/14 0010 05/12/14 0816 05/12/14 1134  GLUCAP 107* 103* 101* 88 135*    Signed:  07/13/14  [facility] 05/12/2014, 1:25 PM

## 2014-05-12 NOTE — Progress Notes (Addendum)
Hospice at [facility] representative meeting in room with pt and wife. Pt can discharge to facility today; called MD and left a voicemail informing and requesting discharge summary. Following for discharge.  Addendum: Discharge packet placed on pt's chart; pt, wife, and daughter informed of discharge today. Facility confirmed they can take pt. Pt discharging to Hospice at [facility]. PTAR scheduled for 3:15pm; RN informed. CSW signing off.  Maryclare Labrador, MSW, [facility] Clinical Social Worker [phone]

## 2014-05-12 NOTE — Consult Note (Signed)
I have reviewed this case with our NP and agree with the Assessment and Plan as stated.  Melissa L. Taylor, MD MBA The Palliative Medicine Team at [facility] Team Phone: [phone] Pager: [phone]   

## 2014-05-12 NOTE — Progress Notes (Signed)
Progress Note from the Palliative Medicine Team at [facility]  Subjective:  -talked with patient at bedside, he was able to speak to some of "the things" he hopes to accomplish before he die.    -setting up a Scholarship fund for a high school buddy who was killed in [place]  -he is comfortable with the plan to transition to an in patient hospice facility   SW tells me he has a bed today at [facility]  -family understand the overall poor prognosis and are in agreement to shift to a comfort path   -plan is to discontinue antibiotics, labs and diagnostics and to solely focus on comfort once discharged,  With this plan in place  his prognosis is likely days to weeks        Objective: No Known Allergies Scheduled Meds: . antiseptic oral rinse  15 mL Mouth Rinse q12n4p  . aspirin EC  81 mg Oral Daily  . bisoprolol  2.5 mg Oral Daily  . budesonide (PULMICORT) nebulizer solution  0.25 mg Nebulization BID  . collagenase   Topical Daily  . DAPTOmycin (CUBICIN)  IV  500 mg Intravenous Q24H  . feeding supplement (PRO-STAT SUGAR FREE 64)  30 mL Oral TID WC  . feeding supplement (RESOURCE BREEZE)  1 Container Oral Q24H  . fluconazole  200 mg Oral Daily  . folic acid  1 mg Oral Daily  . insulin aspart  0-15 Units Subcutaneous TID WC  . lactobacillus acidophilus  1 tablet Oral BID  . lidocaine  1 patch Transdermal Daily  . meropenem (MERREM) IV  1 g Intravenous 3 times per day  . mirtazapine  7.5 mg Oral QHS  . pantoprazole  40 mg Oral Q1200  . predniSONE  10 mg Oral Q breakfast  . senna  1 tablet Oral QHS  . tamsulosin  0.4 mg Oral Daily  . vitamin C  250 mg Oral TID WC   Continuous Infusions: . heparin 1,600 Units/hr (05/12/14 0800)   PRN Meds:.acetaminophen, levalbuterol, morphine injection, RESOURCE THICKENUP CLEAR  BP 104/58  Pulse 94  Temp(Src) 97.5 F (36.4 C) (Oral)  Resp 21  Ht 5\' 8"  (1.727 m)  Wt 76.6 kg (168 lb 14 oz)  BMI 25.68 kg/m2  SpO2 100%   PPS:30 % at  best  Pain Location back pain, increases with any movement    Intake/Output Summary (Last 24 hours) at 05/12/14 0903 Last data filed at 05/12/14 0835  Gross per 24 hour  Intake   1188 ml  Output    450 ml  Net    738 ml      LBM: 05-10-14     Physical Exam:  General: ill appearing, NAD HEENT: Moist buccal membranes  Chest: Diminished throughout, scattered Wh and RH  CVS: RRR Abdomen: soft NT +BS  Ext: +2 edema BUE, noted bilateral foot drop (demonstrated PROM exercises)  Neuro: lethargic, engages in conversation at this time   Labs: CBC    Component Value Date/Time   WBC 8.4 05/11/2014 0552   RBC 2.75* 05/11/2014 0552   HGB 7.5* 05/11/2014 0552   HCT 24.8* 05/11/2014 0552   PLT 191 05/11/2014 0552   MCV 90.2 05/11/2014 0552   MCH 27.3 05/11/2014 0552   MCHC 30.2 05/11/2014 0552   RDW 18.1* 05/11/2014 0552   LYMPHSABS 0.9 05/05/2014 1205   MONOABS 0.6 05/05/2014 1205   EOSABS 0.1 05/05/2014 1205   BASOSABS 0.0 05/05/2014 1205    BMET  Component Value Date/Time   NA 140 05/11/2014 0552   K 3.6* 05/11/2014 0552   CL 97 05/11/2014 0552   CO2 34* 05/11/2014 0552   GLUCOSE 98 05/11/2014 0552   BUN 12 05/11/2014 0552   CREATININE 0.44* 05/11/2014 0552   CALCIUM 9.2 05/11/2014 0552   GFRNONAA >90 05/11/2014 0552   GFRAA >90 05/11/2014 0552    CMP     Component Value Date/Time   NA 140 05/11/2014 0552   K 3.6* 05/11/2014 0552   CL 97 05/11/2014 0552   CO2 34* 05/11/2014 0552   GLUCOSE 98 05/11/2014 0552   BUN 12 05/11/2014 0552   CREATININE 0.44* 05/11/2014 0552   CALCIUM 9.2 05/11/2014 0552   PROT 6.7 04/25/2014 1805   ALBUMIN 2.7* 04/25/2014 1805   AST 26 04/25/2014 1805   ALT 14 04/25/2014 1805   ALKPHOS 152* 04/25/2014 1805   BILITOT 0.8 04/25/2014 1805   GFRNONAA >90 05/11/2014 0552   GFRAA >90 05/11/2014 0552    Assessment and Plan: 1. Code Status:DNR/DNI-comfort is primary focus of care 2. Symptom Control:     Pain/Dyspnea:  Morphine IV 2 mg every 2 hrs prn      Anxiety:  Ativan IV 1 mg every 4 hrs prn 3. Psycho/Social:  Sat quietly with patient and allowed space for life review.  It is my honor to work with Mr [facility] 4. Disposition:  Hopeful for in patient hospice facility    Time In Time Out Total Time Spent with Patient Total Overall Time  0820 0850 30 min 30 min    Greater than 50%  of this time was spent counseling and coordinating care related to the above assessment and plan.  Lorinda Creed NP  Palliative Medicine Team Team Phone # [phone] Pager [phone]  Dr Gwenlyn Perking  aware 1

## 2014-05-15 LAB — CULTURE, BLOOD (ROUTINE X 2)
CULTURE: NO GROWTH
Culture: NO GROWTH

## 2014-06-12 DEATH — deceased

## 2014-12-18 IMAGING — CR DG CHEST 1V PORT
1 series · 1 of 1 positions shown · non-contrast
Comparison: 02/17/2014

CLINICAL DATA: Shortness of breath and pneumonia.

EXAM:
PORTABLE CHEST - 1 VIEW

[AP]
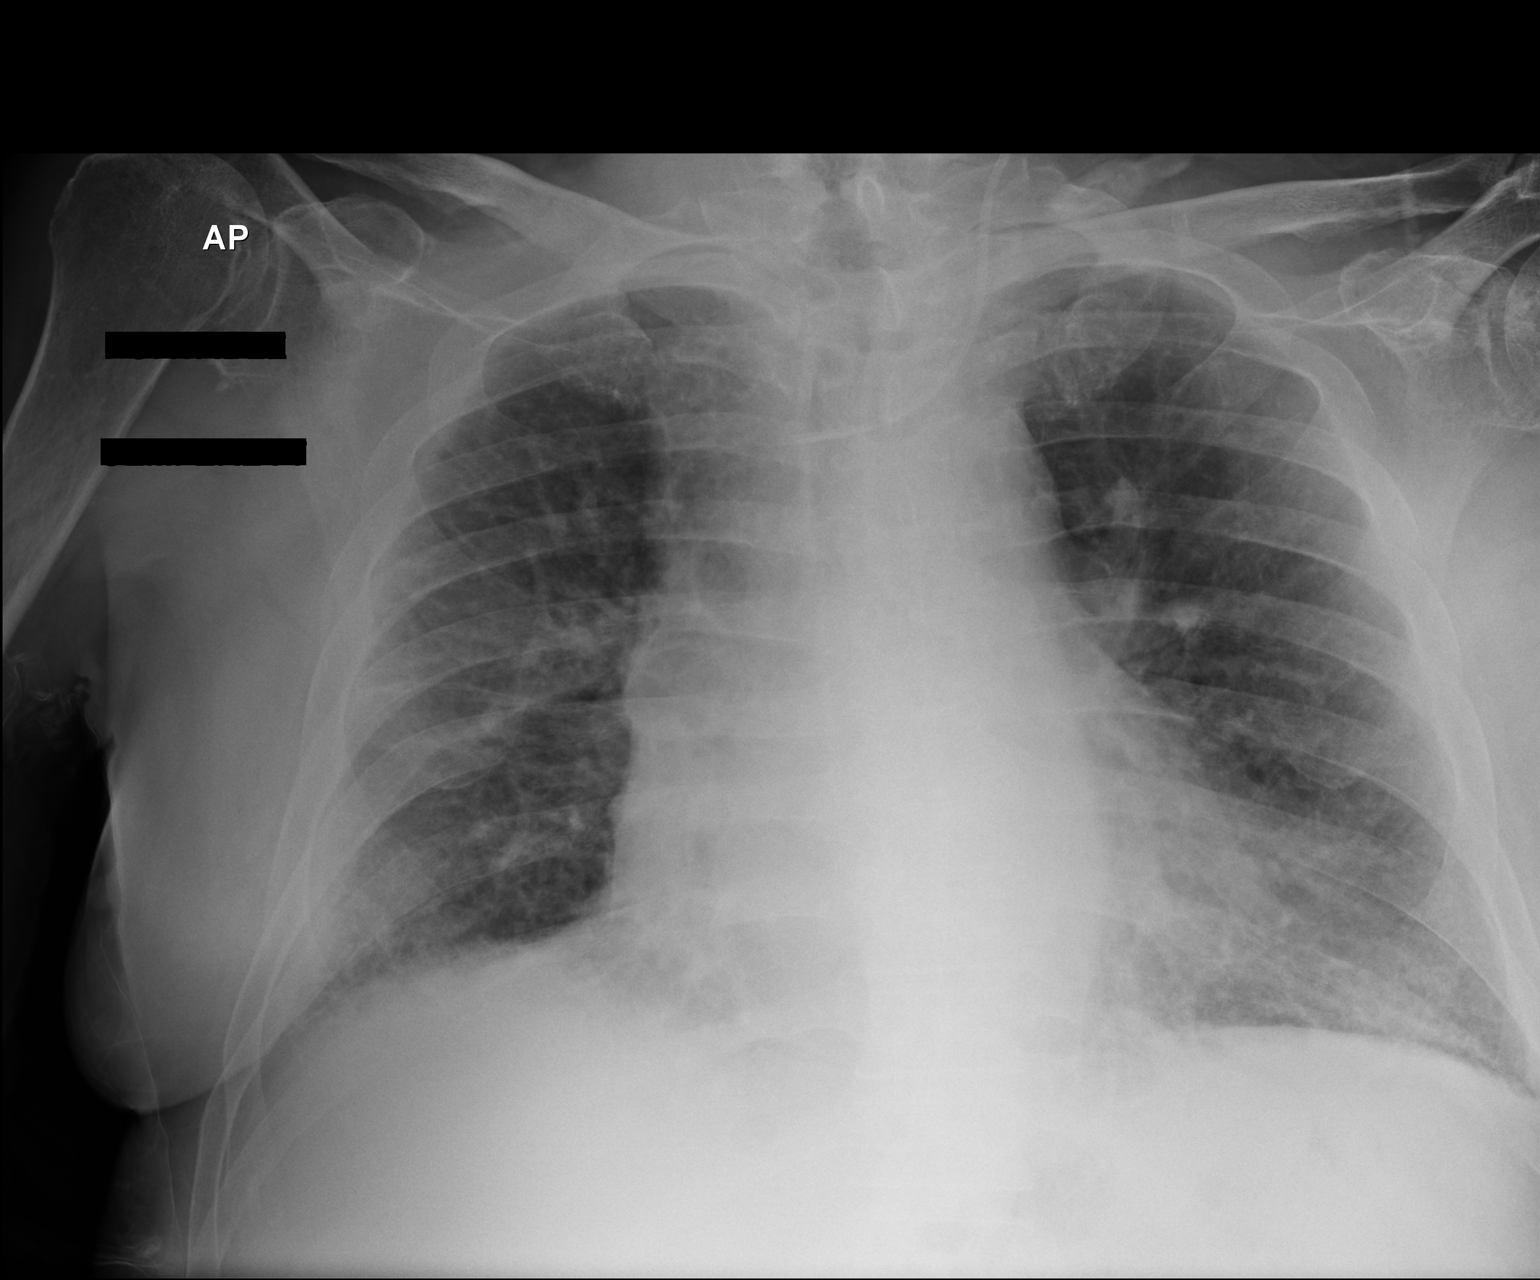

[1 of 1 positions shown; findings below may reference images not displayed]

FINDINGS: There is a left jugular central venous catheter. The tip is near the
junction of the left innominate vein and SVC and stable in position.
There are prominent interstitial lung densities which are unchanged.
Heart size is upper limits of normal but stable. The trachea is
midline.
IMPRESSION: Prominent interstitial lung markings without focal consolidation or
airspace disease. Findings may represent mild edema.

Stable position of the central line as described. Negative for a
pneumothorax.

## 2015-02-16 IMAGING — CR DG CHEST 2V
3 series · 3 of 3 positions shown · non-contrast
Comparison: 04/25/2014

CLINICAL DATA: Hypoxia.  Question CHF

EXAM:
CHEST  2 VIEW

[x chest ap]
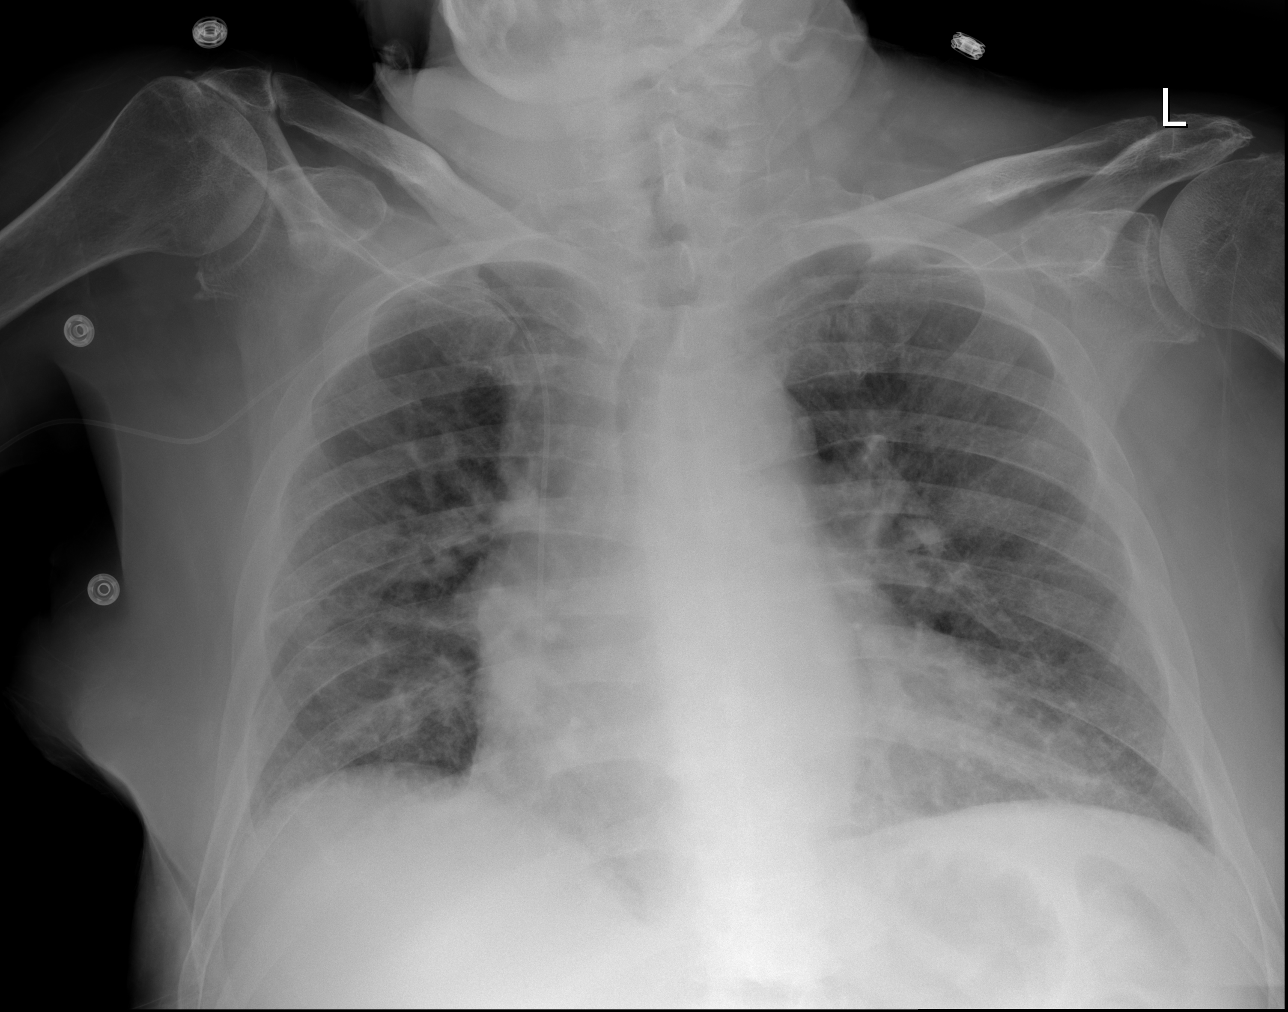

[w chest lat (1 of 2)]
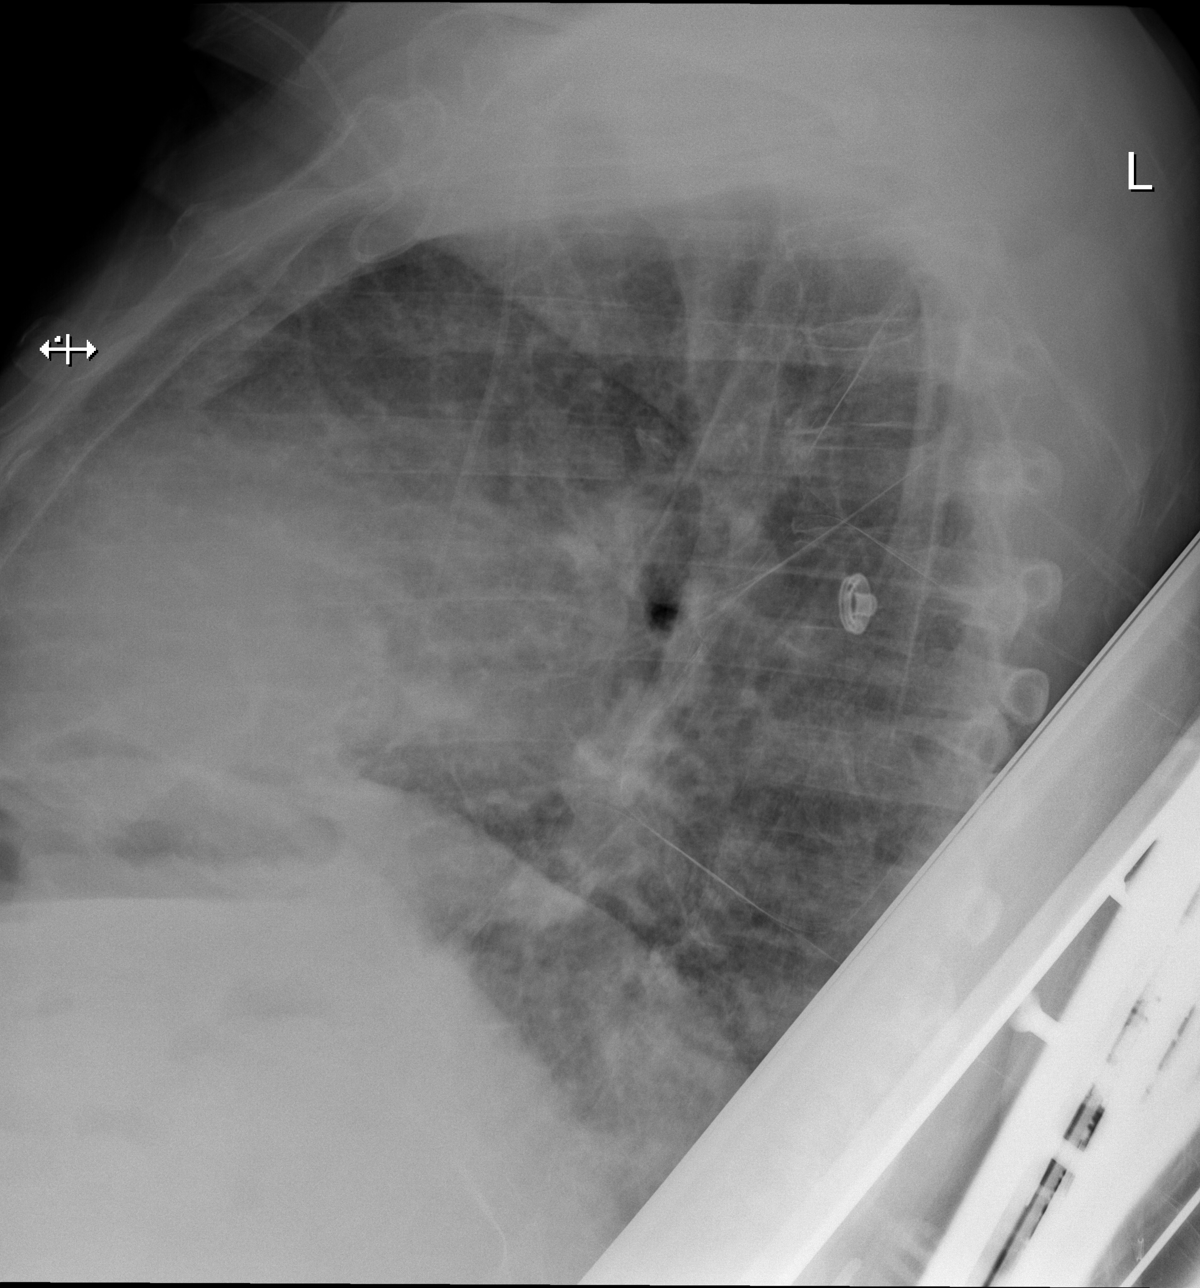

[w chest lat (2 of 2)]
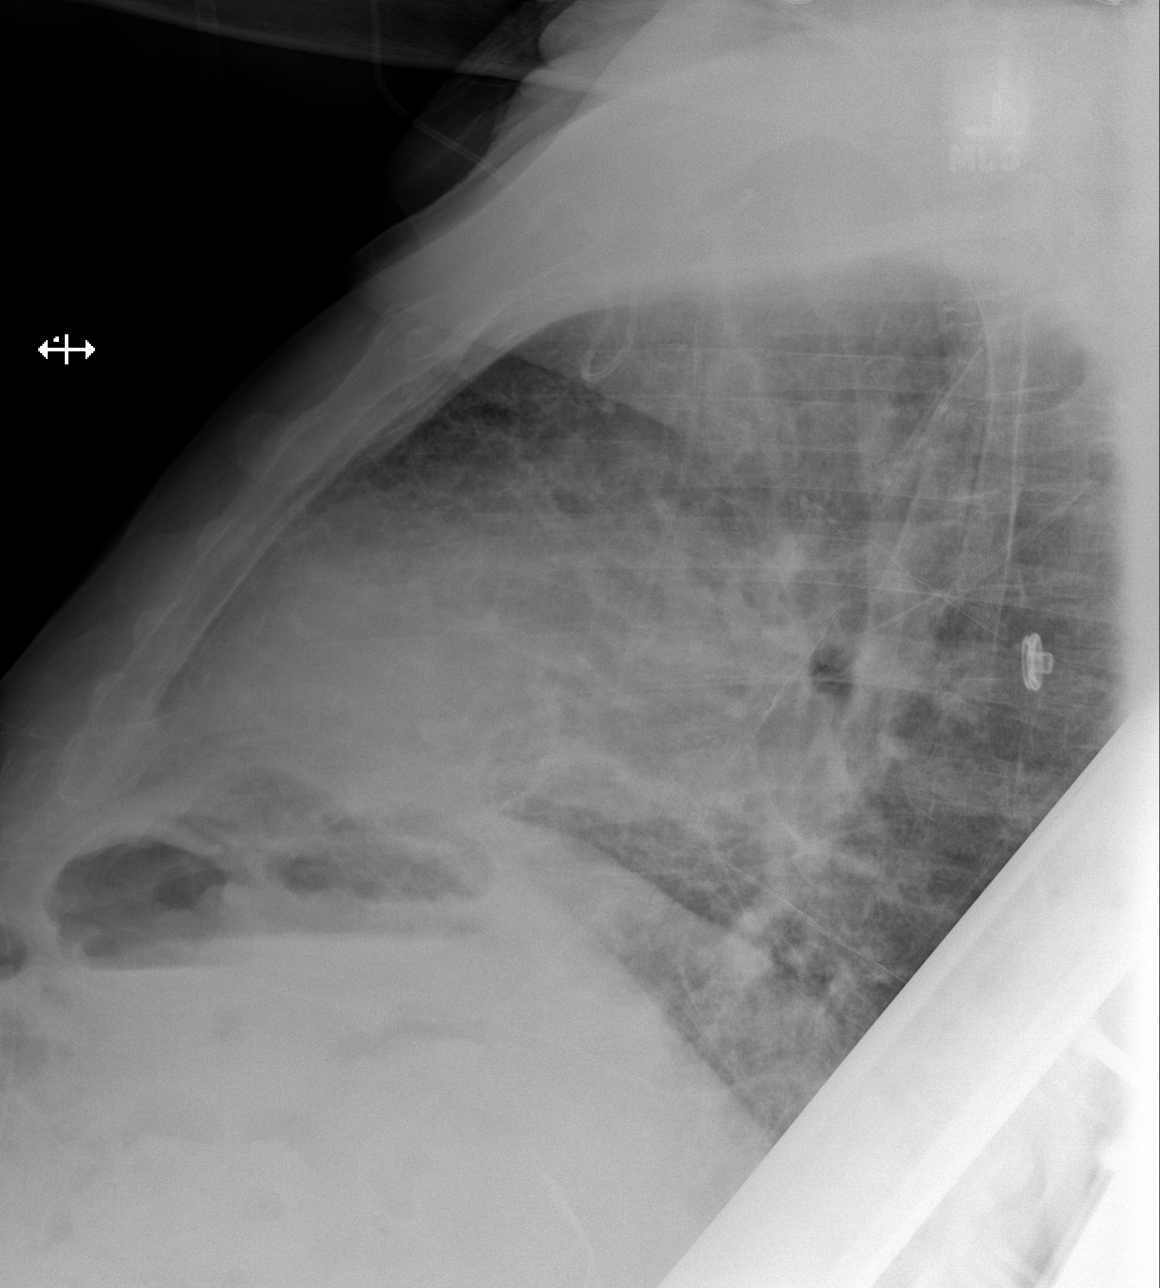

[3 of 3 positions shown; findings below may reference images not displayed]

FINDINGS: Right arm PICC tip in the SVC unchanged.

Diffusely increased lung markings unchanged from the prior study.
Question mild CHF versus chronic lung disease. No significant
effusion.
IMPRESSION: Prominent lung markings are stable and may be due to chronic CHF or
chronic lung disease and fibrosis. No superimposed edema or
effusion.

## 2015-02-22 IMAGING — CR DG CHEST 1V PORT
1 series · 1 of 1 positions shown · non-contrast
Comparison: None.

CLINICAL DATA: follow SOB

EXAM:
PORTABLE CHEST - 1 VIEW

[AP]
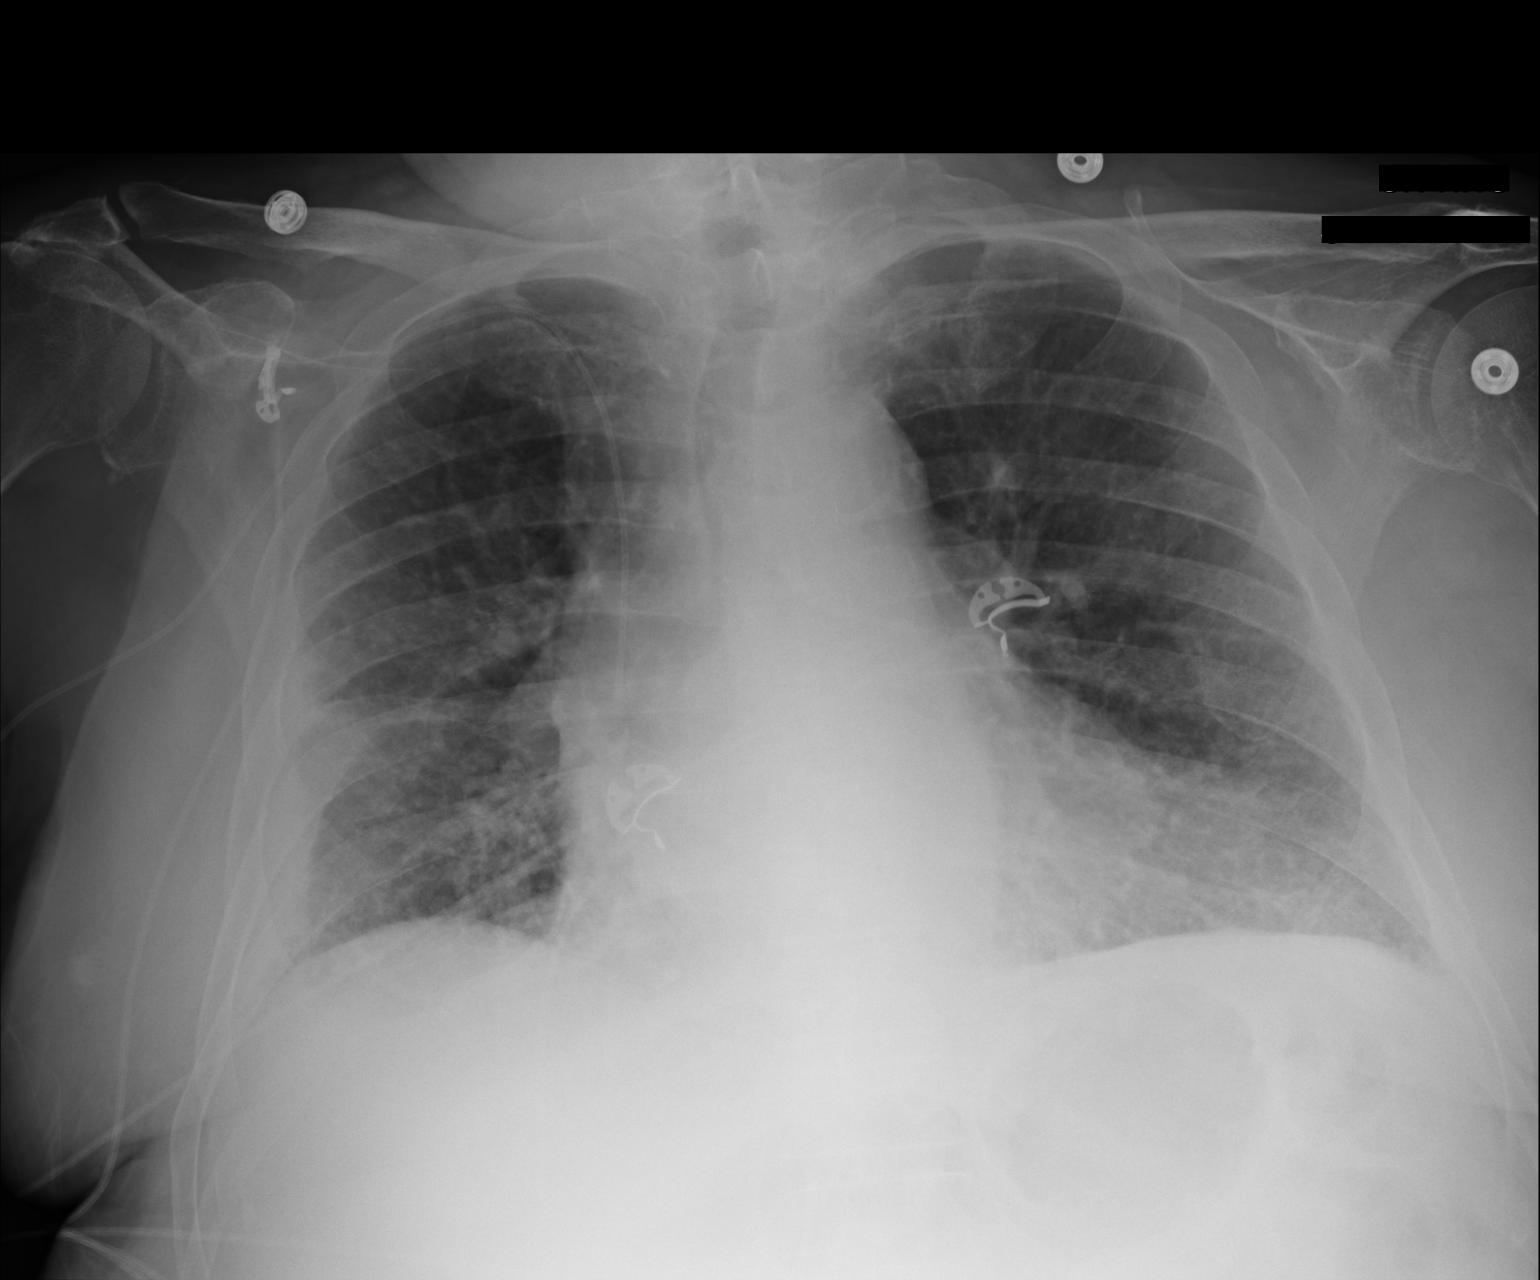

[1 of 1 positions shown; findings below may reference images not displayed]

FINDINGS: Low lung volumes. The heart size and mediastinal contours stable.
Right central venous catheter via subclavian approach tip superior
vena cava. There is increased density along the periphery of the
right lung base. Mild prominence of the interstitial markings is
appreciated as well as mild central peribronchial cuffing. Linear
areas increased density within the lung bases. Degenerative changes
within the shoulders. No acute osseous abnormalities. Both lungs are
clear. The visualized skeletal structures are unremarkable.
IMPRESSION: Density along the peripheral base right hemithorax differential
considerations atelectasis versus infiltrate possibly loculated
effusion. Surveillance evaluation recommended with PA and lateral
chest radiograph.

Pulmonary vascular congestion/ edema

Atelectasis versus scarring within the lung bases.

## 2015-03-02 IMAGING — CR DG CHEST 1V PORT
1 series · 1 of 1 positions shown · non-contrast
Comparison: 05/08/2014

CLINICAL DATA: Difficulty breathing

EXAM:
PORTABLE CHEST - 1 VIEW

[AP]
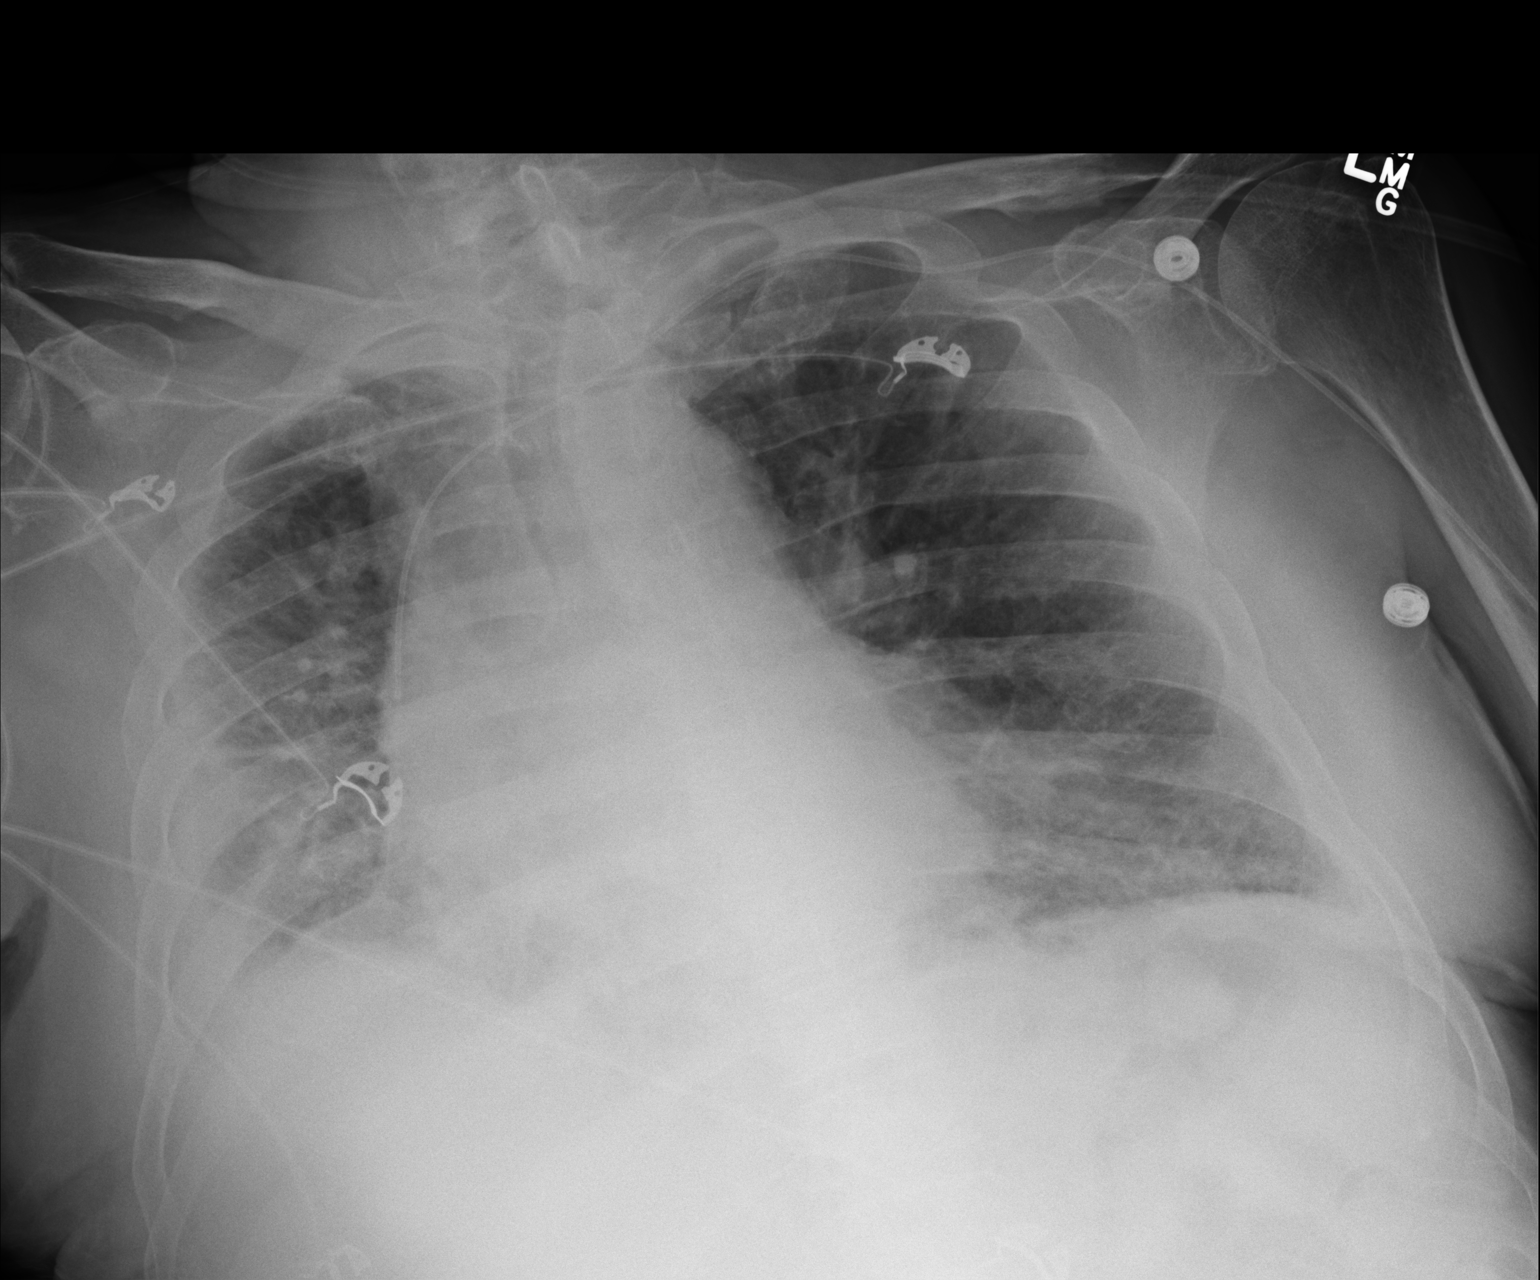

[1 of 1 positions shown; findings below may reference images not displayed]

FINDINGS: A left-sided PICC line is again noted in the mid superior vena cava.
Bibasilar changes are noted slightly worse on the right than the
left. Small right-sided effusion is noted as well. These changes of
increased in the interval from the prior exam particularly in the
right lung base.
IMPRESSION: Increasing right basilar atelectasis and effusion.
